# Patient Record
Sex: Male | Born: 1950 | Race: Black or African American | Hispanic: No | Marital: Married | Smoking: Never smoker
Health system: Southern US, Community
[De-identification: ages and names within clinical notes are randomized; demographics above are authoritative.]

## PROBLEM LIST (undated history)

## (undated) DIAGNOSIS — K56609 Unspecified intestinal obstruction, unspecified as to partial versus complete obstruction: Secondary | ICD-10-CM

## (undated) DIAGNOSIS — I10 Essential (primary) hypertension: Secondary | ICD-10-CM

## (undated) DIAGNOSIS — M1990 Unspecified osteoarthritis, unspecified site: Secondary | ICD-10-CM

## (undated) DIAGNOSIS — H35039 Hypertensive retinopathy, unspecified eye: Secondary | ICD-10-CM

## (undated) DIAGNOSIS — Z9289 Personal history of other medical treatment: Secondary | ICD-10-CM

## (undated) HISTORY — PX: CATARACT EXTRACTION: SUR2

## (undated) HISTORY — DX: Hypertensive retinopathy, unspecified eye: H35.039

## (undated) HISTORY — PX: ABDOMINAL EXPLORATION SURGERY: SHX538

---

## 1988-08-29 DIAGNOSIS — Z9289 Personal history of other medical treatment: Secondary | ICD-10-CM

## 1988-08-29 HISTORY — DX: Personal history of other medical treatment: Z92.89

## 1988-08-29 HISTORY — PX: COLOSTOMY: SHX63

## 1988-08-29 HISTORY — PX: COLECTOMY: SHX59

## 1988-08-29 HISTORY — PX: COLOSTOMY TAKEDOWN: SHX5783

## 2004-08-25 ENCOUNTER — Emergency Department (HOSPITAL_COMMUNITY): Admission: EM | Admit: 2004-08-25 | Discharge: 2004-08-26 | Payer: Self-pay | Admitting: Emergency Medicine

## 2004-08-29 HISTORY — PX: QUADRICEPS TENDON REPAIR: SHX756

## 2005-06-18 ENCOUNTER — Emergency Department (HOSPITAL_COMMUNITY): Admission: EM | Admit: 2005-06-18 | Discharge: 2005-06-18 | Payer: Self-pay | Admitting: Emergency Medicine

## 2005-06-30 ENCOUNTER — Ambulatory Visit (HOSPITAL_COMMUNITY): Admission: RE | Admit: 2005-06-30 | Discharge: 2005-07-01 | Payer: Self-pay | Admitting: Orthopaedic Surgery

## 2008-06-18 ENCOUNTER — Emergency Department (HOSPITAL_COMMUNITY): Admission: EM | Admit: 2008-06-18 | Discharge: 2008-06-18 | Payer: Self-pay | Admitting: Emergency Medicine

## 2008-06-18 ENCOUNTER — Encounter (INDEPENDENT_AMBULATORY_CARE_PROVIDER_SITE_OTHER): Payer: Self-pay | Admitting: Family Medicine

## 2009-03-15 ENCOUNTER — Emergency Department (HOSPITAL_COMMUNITY): Admission: EM | Admit: 2009-03-15 | Discharge: 2009-03-15 | Payer: Self-pay | Admitting: Emergency Medicine

## 2009-06-01 ENCOUNTER — Emergency Department (HOSPITAL_COMMUNITY): Admission: EM | Admit: 2009-06-01 | Discharge: 2009-06-01 | Payer: Self-pay | Admitting: Emergency Medicine

## 2009-07-22 ENCOUNTER — Ambulatory Visit: Payer: Self-pay | Admitting: Family Medicine

## 2009-07-22 DIAGNOSIS — M545 Low back pain: Secondary | ICD-10-CM | POA: Insufficient documentation

## 2009-07-22 DIAGNOSIS — K648 Other hemorrhoids: Secondary | ICD-10-CM

## 2009-07-22 DIAGNOSIS — J984 Other disorders of lung: Secondary | ICD-10-CM

## 2009-07-22 DIAGNOSIS — I10 Essential (primary) hypertension: Secondary | ICD-10-CM

## 2009-07-22 DIAGNOSIS — K921 Melena: Secondary | ICD-10-CM | POA: Insufficient documentation

## 2009-07-29 ENCOUNTER — Ambulatory Visit: Payer: Self-pay | Admitting: Family Medicine

## 2009-08-05 ENCOUNTER — Ambulatory Visit (HOSPITAL_COMMUNITY): Admission: RE | Admit: 2009-08-05 | Discharge: 2009-08-05 | Payer: Self-pay | Admitting: Family Medicine

## 2009-08-05 ENCOUNTER — Ambulatory Visit: Payer: Self-pay | Admitting: Family Medicine

## 2009-08-05 DIAGNOSIS — K044 Acute apical periodontitis of pulpal origin: Secondary | ICD-10-CM

## 2009-08-05 DIAGNOSIS — E559 Vitamin D deficiency, unspecified: Secondary | ICD-10-CM

## 2009-08-05 DIAGNOSIS — F3289 Other specified depressive episodes: Secondary | ICD-10-CM | POA: Insufficient documentation

## 2009-08-05 DIAGNOSIS — R7301 Impaired fasting glucose: Secondary | ICD-10-CM

## 2009-08-05 DIAGNOSIS — F329 Major depressive disorder, single episode, unspecified: Secondary | ICD-10-CM

## 2009-08-06 ENCOUNTER — Encounter (INDEPENDENT_AMBULATORY_CARE_PROVIDER_SITE_OTHER): Payer: Self-pay | Admitting: Family Medicine

## 2009-08-26 ENCOUNTER — Telehealth (INDEPENDENT_AMBULATORY_CARE_PROVIDER_SITE_OTHER): Payer: Self-pay | Admitting: Nurse Practitioner

## 2009-09-24 ENCOUNTER — Ambulatory Visit: Payer: Self-pay | Admitting: Physician Assistant

## 2009-09-24 DIAGNOSIS — E782 Mixed hyperlipidemia: Secondary | ICD-10-CM | POA: Insufficient documentation

## 2009-09-24 DIAGNOSIS — R197 Diarrhea, unspecified: Secondary | ICD-10-CM | POA: Insufficient documentation

## 2009-09-24 DIAGNOSIS — E785 Hyperlipidemia, unspecified: Secondary | ICD-10-CM

## 2009-09-24 DIAGNOSIS — E1169 Type 2 diabetes mellitus with other specified complication: Secondary | ICD-10-CM

## 2009-09-24 LAB — CONVERTED CEMR LAB
Blood Glucose, Fingerstick: 171
Cholesterol, target level: 200 mg/dL
HDL goal, serum: 40 mg/dL
LDL Goal: 130 mg/dL

## 2009-10-05 ENCOUNTER — Telehealth: Payer: Self-pay | Admitting: Physician Assistant

## 2009-11-05 ENCOUNTER — Ambulatory Visit: Payer: Self-pay | Admitting: Physician Assistant

## 2009-11-05 DIAGNOSIS — N529 Male erectile dysfunction, unspecified: Secondary | ICD-10-CM

## 2009-11-05 LAB — CONVERTED CEMR LAB
BUN: 17 mg/dL (ref 6–23)
Basophils Absolute: 0 10*3/uL (ref 0.0–0.1)
Basophils Relative: 0 % (ref 0–1)
CO2: 27 meq/L (ref 19–32)
Calcium: 9.9 mg/dL (ref 8.4–10.5)
Chloride: 103 meq/L (ref 96–112)
Creatinine, Ser: 1.23 mg/dL (ref 0.40–1.50)
Eosinophils Absolute: 0.2 10*3/uL (ref 0.0–0.7)
Eosinophils Relative: 4 % (ref 0–5)
Glucose, Bld: 93 mg/dL (ref 70–99)
HCT: 42.7 % (ref 39.0–52.0)
Hemoglobin: 13.8 g/dL (ref 13.0–17.0)
Lymphocytes Relative: 29 % (ref 12–46)
Lymphs Abs: 1.7 10*3/uL (ref 0.7–4.0)
MCHC: 32.3 g/dL (ref 30.0–36.0)
MCV: 91.4 fL (ref 78.0–100.0)
Monocytes Absolute: 0.5 10*3/uL (ref 0.1–1.0)
Monocytes Relative: 8 % (ref 3–12)
Neutro Abs: 3.5 10*3/uL (ref 1.7–7.7)
Neutrophils Relative %: 59 % (ref 43–77)
PSA: 1.15 ng/mL (ref 0.10–4.00)
Platelets: 178 10*3/uL (ref 150–400)
Potassium: 4.1 meq/L (ref 3.5–5.3)
RBC: 4.67 M/uL (ref 4.22–5.81)
RDW: 13.5 % (ref 11.5–15.5)
Sodium: 140 meq/L (ref 135–145)
WBC: 5.9 10*3/uL (ref 4.0–10.5)

## 2009-11-17 ENCOUNTER — Telehealth: Payer: Self-pay | Admitting: Physician Assistant

## 2009-11-20 ENCOUNTER — Encounter: Payer: Self-pay | Admitting: Physician Assistant

## 2009-12-07 ENCOUNTER — Telehealth (INDEPENDENT_AMBULATORY_CARE_PROVIDER_SITE_OTHER): Payer: Self-pay | Admitting: *Deleted

## 2009-12-10 ENCOUNTER — Telehealth: Payer: Self-pay | Admitting: Physician Assistant

## 2010-01-05 ENCOUNTER — Ambulatory Visit: Payer: Self-pay | Admitting: Physician Assistant

## 2010-01-06 LAB — CONVERTED CEMR LAB
ALT: 28 units/L (ref 0–53)
AST: 30 units/L (ref 0–37)
Albumin: 4.1 g/dL (ref 3.5–5.2)
Alkaline Phosphatase: 60 units/L (ref 39–117)
BUN: 20 mg/dL (ref 6–23)
CO2: 18 meq/L — ABNORMAL LOW (ref 19–32)
Calcium: 9 mg/dL (ref 8.4–10.5)
Chloride: 109 meq/L (ref 96–112)
Cholesterol: 161 mg/dL (ref 0–200)
Creatinine, Ser: 1.36 mg/dL (ref 0.40–1.50)
Glucose, Bld: 103 mg/dL — ABNORMAL HIGH (ref 70–99)
HDL: 36 mg/dL — ABNORMAL LOW (ref >39)
LDL Cholesterol: 76 mg/dL (ref 0–99)
Potassium: 4.2 meq/L (ref 3.5–5.3)
Sodium: 141 meq/L (ref 135–145)
Total Bilirubin: 0.5 mg/dL (ref 0.3–1.2)
Total CHOL/HDL Ratio: 4.5
Total Protein: 7.4 g/dL (ref 6.0–8.3)
Triglycerides: 244 mg/dL — ABNORMAL HIGH (ref <150)
VLDL: 49 mg/dL — ABNORMAL HIGH (ref 0–40)
Vit D, 25-Hydroxy: 33 ng/mL (ref 30–89)

## 2010-02-04 ENCOUNTER — Ambulatory Visit (HOSPITAL_COMMUNITY): Admission: RE | Admit: 2010-02-04 | Discharge: 2010-02-04 | Payer: Self-pay | Admitting: Gastroenterology

## 2010-02-08 ENCOUNTER — Telehealth: Payer: Self-pay | Admitting: Physician Assistant

## 2010-02-08 ENCOUNTER — Ambulatory Visit: Payer: Self-pay | Admitting: Physician Assistant

## 2010-02-08 DIAGNOSIS — D126 Benign neoplasm of colon, unspecified: Secondary | ICD-10-CM

## 2010-02-08 LAB — CONVERTED CEMR LAB: Hgb A1c MFr Bld: 6.5 %

## 2010-07-11 ENCOUNTER — Emergency Department (HOSPITAL_COMMUNITY): Admission: EM | Admit: 2010-07-11 | Discharge: 2010-07-11 | Payer: Self-pay | Admitting: Emergency Medicine

## 2010-09-26 LAB — CONVERTED CEMR LAB
ALT: 17 units/L (ref 0–53)
AST: 21 units/L (ref 0–37)
Albumin: 4.1 g/dL (ref 3.5–5.2)
Alkaline Phosphatase: 65 units/L (ref 39–117)
BUN: 16 mg/dL (ref 6–23)
BUN: 16 mg/dL (ref 6–23)
Basophils Absolute: 0 10*3/uL (ref 0.0–0.1)
Basophils Relative: 0 % (ref 0–1)
Blood Glucose, AC Bkfst: 116 mg/dL
CO2: 22 meq/L (ref 19–32)
CO2: 23 meq/L (ref 19–32)
CRP: 0.6 mg/dL — ABNORMAL HIGH (ref <0.6)
Calcium: 9.4 mg/dL (ref 8.4–10.5)
Calcium: 9.5 mg/dL (ref 8.4–10.5)
Chloride: 104 meq/L (ref 96–112)
Chloride: 106 meq/L (ref 96–112)
Cholesterol: 186 mg/dL (ref 0–200)
Creatinine, Ser: 1.25 mg/dL (ref 0.40–1.50)
Creatinine, Ser: 1.37 mg/dL (ref 0.40–1.50)
Eosinophils Absolute: 0.2 10*3/uL (ref 0.0–0.7)
Eosinophils Relative: 3 % (ref 0–5)
Glucose, Bld: 110 mg/dL — ABNORMAL HIGH (ref 70–99)
Glucose, Bld: 151 mg/dL — ABNORMAL HIGH (ref 70–99)
HCT: 39.4 % (ref 39.0–52.0)
HCV Ab: NEGATIVE
HDL: 36 mg/dL — ABNORMAL LOW (ref >39)
Hemoglobin: 13.3 g/dL (ref 13.0–17.0)
LDL Cholesterol: 83 mg/dL (ref 0–99)
Lymphocytes Relative: 20 % (ref 12–46)
Lymphs Abs: 1.4 10*3/uL (ref 0.7–4.0)
MCHC: 33.8 g/dL (ref 30.0–36.0)
MCV: 89.7 fL (ref 78.0–100.0)
Monocytes Absolute: 0.5 10*3/uL (ref 0.1–1.0)
Monocytes Relative: 7 % (ref 3–12)
Neutro Abs: 5 10*3/uL (ref 1.7–7.7)
Neutrophils Relative %: 71 % (ref 43–77)
Platelets: 182 10*3/uL (ref 150–400)
Potassium: 3.7 meq/L (ref 3.5–5.3)
Potassium: 4.1 meq/L (ref 3.5–5.3)
RBC: 4.39 M/uL (ref 4.22–5.81)
RDW: 12.9 % (ref 11.5–15.5)
Sed Rate: 19 mm/hr — ABNORMAL HIGH (ref 0–16)
Sodium: 141 meq/L (ref 135–145)
Sodium: 142 meq/L (ref 135–145)
TSH: 4.401 microintl units/mL (ref 0.350–4.500)
Total Bilirubin: 0.3 mg/dL (ref 0.3–1.2)
Total CHOL/HDL Ratio: 5.2
Total Protein: 7.1 g/dL (ref 6.0–8.3)
Triglycerides: 334 mg/dL — ABNORMAL HIGH (ref <150)
VLDL: 67 mg/dL — ABNORMAL HIGH (ref 0–40)
Vit D, 25-Hydroxy: 16 ng/mL — ABNORMAL LOW (ref 30–89)
WBC: 7 10*3/uL (ref 4.0–10.5)

## 2010-09-28 NOTE — Progress Notes (Signed)
Summary: GI update  Phone Note From Other Clinic   Caller: Referral Coordinator Summary of Call: Pt aware that he can call [facility] and sched accroding to his availability. Initial call taken by: Candi Leash,  December 10, 2009 11:53 AM  Follow-up for Phone Call        [place],  Please ask him when he plans to go. Follow-up by: Tereso Newcomer PA-C,  December 10, 2009 1:23 PM  Additional Follow-up for Phone Call Additional follow up Details #1::        Left message on answering machine for pt to call back.Marland KitchenMarland KitchenMarland KitchenArmenia Shannon  December 11, 2009 4:53 PM     Additional Follow-up for Phone Call Additional follow up Details #2::    pt says that he called but they would not give him a date... [place] Shannon  December 14, 2009 2:39 PM  spoke with [facility] and they need pt to call for the appt date and time... [place] Shannon  December 14, 2009 2:45 PM

## 2010-09-28 NOTE — Assessment & Plan Note (Signed)
Summary: 3 MONTH FU/////KT   Vital Signs:  Patient profile:   60 year old male Weight:      216 pounds Temp:     97.4 degrees F Pulse rate:   82 / minute Pulse rhythm:   regular Resp:     18 per minute BP sitting:   144 / 89  (left arm) Cuff size:   large  Vitals Entered By: Vesta Mixer CMA (February 08, 2010 8:53 AM)  Serial Vital Signs/Assessments:  Time      Position  BP       Pulse  Resp  Temp     By 9:20 AM             124/88                         Tereso Newcomer PA-C 9:21 AM             134/90                         Tereso Newcomer PA-C  Comments: 9:20 AM left arm By: Tereso Newcomer PA-C  9:21 AM right arm  By: Tereso Newcomer PA-C   CC: 3 month f/u.   Pt states he is only taking two meds and his fish oil, but not sure the name of the 2 he is taking., Hypertension Management Is Patient Diabetic? No Pain Assessment Patient in pain? no       Does patient need assistance? Ambulation Normal   Primary Care Provider:  Tereso Newcomer, PA-C  CC:  3 month f/u.   Pt states he is only taking two meds and his fish oil, but not sure the name of the 2 he is taking., and Hypertension Management.  History of Present Illness: Here for f/u.  HTN:  Here for f/u.  Admits compliance with meds.  Watching salt.  Does not eat out.  Occ. caffeine.  Drinks beer . . . 1-2 quarts on weekends.  No cigs.    BRBPR:  Had colo.  No report rec'd yet.  States he has hemorrhoids.  Original consult in Dec. was for surgery but needed colo first.    Vit. D:  needs daily supplement.  Erectile Dysfunction:  Still having problems.  Discussed poss. starting viagra last visit.  PSA was ok.   Hypertension History:      He denies headache, chest pain, dyspnea with exertion, and syncope.  He notes the following problems with antihypertensive medication side effects: erectile dysfunction.        Positive major cardiovascular risk factors include male age 50 years old or older, hyperlipidemia, and  hypertension.  Negative major cardiovascular risk factors include negative family history for ischemic heart disease and non-tobacco-user status.        Further assessment for target organ damage reveals no history of ASHD, stroke/TIA, or peripheral vascular disease.     Allergies (verified): No Known Drug Allergies  Past History:  Past Medical History: Hypertension Low back pain Hematochezia   a.  colo 02/04/2010: polyps; ? 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   Head:  normocephalic and atraumatic.   Neck:  supple.   Lungs:  normal breath sounds.   Heart:  normal rate and regular  rhythm.   Abdomen:  soft and non-tender.   Extremities:  no edema Neurologic:  alert & oriented X3 and cranial nerves II-XII intact.   Psych:  normally interactive.     Impression & Recommendations:  Problem # 1:  HEMORRHOIDS, INTERNAL (ICD-455.0)  now that he has had colo, will set up referral to surgeon records indicate he had seen Dr. Zachery Dakins previously Dr. Luan Moore note reviewed. . . .needs surgical referral due to ? polyp in rectum  Orders: Surgical Referral (Surgery)  Problem # 2:  HYPERTENSION (ICD-401.9) close to goal watch salt  His updated medication list for this problem includes:    Norvasc 10 Mg Tabs (Amlodipine besylate) .Marland Kitchen... 1/2 by mouth daily x 7 days then 1 by mouth daily    Lisinopril-hydrochlorothiazide 20-25 Mg Tabs (Lisinopril-hydrochlorothiazide) .Marland Kitchen... 1 by mouth daily  Problem # 3:  VITAMIN D DEFICIENCY (ICD-268.9) daily supplement rx given  Problem # 4:  ORGANIC IMPOTENCE (ICD-607.84)  Rx for viagra given  His updated medication list for this problem includes:    Viagra 50 Mg Tabs (Sildenafil citrate) .Marland Kitchen... Take 1 by mouth 30-60 minutes prior to intercourse as needed.  no more than one tablet  in 24 hour period.  Problem # 5:  IMPAIRED FASTING GLUCOSE (ICD-790.21)  A1C 6.5 will send to dietician need to keep close eye on this and poss start metformin if stays 6.5 or goes higher  Orders: Capillary Blood Glucose/CBG (82948) Hemoglobin A1C (83036)  Complete Medication List: 1)  Norvasc 10 Mg Tabs (Amlodipine besylate) .... 1/2 by mouth daily x 7 days then 1 by mouth daily 2)  Lisinopril-hydrochlorothiazide 20-25 Mg Tabs (Lisinopril-hydrochlorothiazide) .Marland Kitchen.. 1 by mouth daily 3)  Fish Oil 1000 Mg Caps (Omega-3 fatty acids) .... Take 1 tablet by mouth two times a day 4)  Vitamin D 400 Unit Caps (Cholecalciferol) .... Take 2 by mouth once daily 5)  Viagra 50 Mg Tabs (Sildenafil citrate) .... Take 1 by mouth 30-60 minutes prior to intercourse as needed.  no more than one tablet in 24 hour period.  Hypertension Assessment/Plan:      The patient's hypertensive risk group is category B: At least one risk factor (excluding diabetes) with no target organ damage.  His calculated 10 year risk of coronary heart disease is 9 %.  Today's blood pressure is 144/89.  His blood pressure goal is < 140/90.  Patient Instructions: 1)  Schedule FLP and LFTs in October 2011. 2)  Schedule appt with Suse Piper for glucose intolerance (A1C 6.5) 3)  Please schedule a follow-up appointment in 3 months with Lamonda Noxon for blood pressure and sugar follow up. Prescriptions: VIAGRA 50 MG TABS (SILDENAFIL CITRATE) Take 1 by mouth 30-60 minutes prior to intercourse as needed.  No more than one tablet in 24 hour period.  #15 x 3   Entered and Authorized by:   Tereso Newcomer PA-C   Signed by:   Tereso Newcomer PA-C on 02/08/2010   Method used:   Print then Give to Patient   RxID:   [id number] VITAMIN D 400 UNIT CAPS (CHOLECALCIFEROL) Take 2 by mouth once daily  #60 x 11   Entered and Authorized by:   Tereso Newcomer PA-C   Signed by:   Tereso Newcomer PA-C on 02/08/2010   Method used:   Print then Give to Patient    RxID:   [id number]    Colonoscopy  Procedure date:  02/04/2010  Findings:      IMPRESSION:   1. Mild  diverticulosis.   2. A small polyp in the sigmoid, which was removed.   3. A small polyp in the rectum, which was removed.   4. At the anorectal junction, there was either a palpable ulcerated       hemorrhoid or polypoid mass.  I am unsure at this time what this       is.  I will plan surgical referral for further evaluation.  I am       not sure if this was a hemorrhoid or a polyp at the anorectal       junction.   Location:  [facility]  Dr. Shanna Cisco   Comments:      Surgical referral recommended Path c/w hyperplastic polyps.  No malignancy identified.      Laboratory Results   Blood Tests     HGBA1C: 6.5%   (Normal Range: Non-Diabetic - 3-6%   Control Diabetic - 6-8%)

## 2010-09-28 NOTE — Letter (Signed)
Summary: DENTAL REFERRAL  DENTAL REFERRAL   Imported By: Arta Bruce 09/21/2009 15:19:47  _____________________________________________________________________  External Attachment:    Type:   Image     Comment:   External Document

## 2010-09-28 NOTE — Progress Notes (Signed)
Summary: Surgical Referral  Phone Note Outgoing Call   Summary of Call: Needs referral to surgery for hemorrhoids.  Saw Dr. Zachery Dakins several mos. ago.  Needed colo.  Colo now done.  Please refer back to Dr. Zachery Dakins. Initial call taken by: Tereso Newcomer PA-C,  February 08, 2010 9:10 AM

## 2010-09-28 NOTE — Letter (Signed)
Summary: Handout Printed  Printed Handout:  - Diet - Sodium-Controlled 

## 2010-09-28 NOTE — Progress Notes (Signed)
Summary: MISLPACED HIS VITAMIN SCRIPT  Phone Note Call from Patient Call back at Home Phone [phone]   Reason for Call: Refill Medication Summary of Call: Logan Knight PT. [name] CALLED AND SAYS THAT HE MISPLACED HIS VITAMIN SCRIPT WHEN HE WAS HER ON 01/27 AND HE WOULD LIKE FOR [place] TO CALL IT INTO HEALTH DEPT. Initial call taken by: Leodis Rains,  October 05, 2009 2:15 PM  Follow-up for Phone Call        called refills into pharmacy...Marland KitchenMarland KitchenMarland Kitchen pt is aware Follow-up by: [place] Shannon,  October 06, 2009 10:25 AM

## 2010-09-28 NOTE — Assessment & Plan Note (Signed)
Summary: 1 MONTH F/U///BC   Vital Signs:  Patient profile:   60 year old male Height:      72 inches Weight:      213 pounds BMI:     28.99 Temp:     97.8 degrees F oral Pulse rate:   93 / minute Pulse rhythm:   regular Resp:     18 per minute BP sitting:   162 / 89  (left arm) Cuff size:   large  Vitals Entered By: [place] Shannon (September 24, 2009 10:33 AM)  Serial Vital Signs/Assessments:  Time      Position  BP       Pulse  Resp  Temp     By 11:16 AM            134/86                         Tereso Newcomer PA-C  CC: pt says since the change of meds.... pt  says he uses the bathroom more now...., Hypertension Management, Lipid Management Is Patient Diabetic? No Pain Assessment Patient in pain? no      CBG Result 171  Does patient need assistance? Functional Status Self care Ambulation Normal   Primary Care Provider:  Tereso Newcomer, PA-C  CC:  pt says since the change of meds.... pt  says he uses the bathroom more now...., Hypertension Management, and Lipid Management.  History of Present Illness: Here for f/u.  Previously followed by Dr. Carolyne Fiscal.  Depression:  Only took citalopram for 2 mos.  No more refills and now off for about 2 weeks.  No suicidal ideations.  Has not seen counseling but is willing to go.  Hematochezia:  Youth worker.  Needs colo. first.  Has been referred to GI at [facility] and goes for initial visit in 2 weeks.  No further bleeding in last several weeks.  HTN:  Out of meds for a few days and restarted just today.  Lung Nodule:  Findings c/w benign hamartoma.  Diarrhea:  Complaining of frequent stools for last few weeks.  May have 5-6 stools.  Loose.  No melena or hematochezia.  No mucus.  No fevers or chills or vomitting.  Thought may be due to meds, but no change when he was out of meds.  No h/o IBD.  No constipation.  No abdominal cramping.  No tenesmus.    Vit. D level:  Not notified of this.  WIll start med.  Hypertriglyceridemia:  Not taking  fish oil.  Hypertension History:      He denies headache, chest pain, dyspnea with exertion, and syncope.  Further comments include: out of meds for 2 days and just restarted this am.        Positive major cardiovascular risk factors include male age 65 years old or older, hyperlipidemia, and hypertension.  Negative major cardiovascular risk factors include negative family history for ischemic heart disease and non-tobacco-user status.        Further assessment for target organ damage reveals no history of ASHD, stroke/TIA, or peripheral vascular disease.    Lipid Management History:      Positive NCEP/ATP III risk factors include male age 15 years old or older, HDL cholesterol less than 40, and hypertension.  Negative NCEP/ATP III risk factors include no family history for ischemic heart disease, non-tobacco-user status, no ASHD (atherosclerotic heart disease), no prior stroke/TIA, no peripheral vascular disease, and no history of aortic aneurysm.  Current Medications (verified): 1)  Norvasc 10 Mg Tabs (Amlodipine Besylate) .... 1/2 By Mouth Daily X 7 Days Then 1 By Mouth Daily 2)  Lisinopril-Hydrochlorothiazide 20-25 Mg Tabs (Lisinopril-Hydrochlorothiazide) .Marland Kitchen.. 1 By Mouth Daily 3)  Citalopram Hydrobromide 20 Mg Tabs (Citalopram Hydrobromide) .Marland Kitchen.. 1 By Mouth Daily 4)  Clindamycin Hcl 300 Mg Caps (Clindamycin Hcl) .Marland Kitchen.. 1 By Mouth Tid  Allergies (verified): No Known Drug Allergies  Physical Exam  General:  alert, well-developed, and well-nourished.   Head:  normocephalic and atraumatic.   Neck:  supple.   Lungs:  normal breath sounds, no crackles, and no wheezes.   Heart:  normal rate and regular rhythm.   Abdomen:  soft, non-tender, normal bowel sounds, and no hepatomegaly.   scar noted Neurologic:  alert & oriented X3 and cranial nerves II-XII intact.   Psych:  normally interactive and good eye contact.     Impression & Recommendations:  Problem # 1:  DEPRESSION, INITIAL  EPISODE (ICD-311) restart med  His updated medication list for this problem includes:    Citalopram Hydrobromide 20 Mg Tabs (Citalopram hydrobromide) .Marland Kitchen... 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 IBS restart SSRI sees GI in 2 weeks  Problem # 6:  HYPERTENSION (ICD-401.9) repeat better prob up from missing meds for a few days hold off on starting asa until colo  His updated medication list for this problem includes:    Norvasc 10 Mg Tabs (Amlodipine besylate) .Marland Kitchen... 1/2 by mouth daily x 7 days then 1 by mouth daily    Lisinopril-hydrochlorothiazide 20-25 Mg Tabs (Lisinopril-hydrochlorothiazide) .Marland Kitchen... 1 by mouth daily  Problem # 7:  HEMATOCHEZIA (ICD-578.1) schedule to see GI at [facility] in 2 weeks if colo ok, start ASA 81 once daily   Complete Medication List: 1)  Norvasc 10 Mg Tabs (Amlodipine besylate) .... 1/2 by mouth daily x 7 days then 1 by mouth daily 2)  Lisinopril-hydrochlorothiazide 20-25 Mg Tabs (Lisinopril-hydrochlorothiazide) .Marland Kitchen.. 1 by mouth daily 3)  Citalopram Hydrobromide 20 Mg Tabs (Citalopram hydrobromide) .Marland Kitchen.. 1 by mouth daily 4)  Clindamycin Hcl 300 Mg Caps (Clindamycin hcl) .Marland Kitchen.. 1 by mouth tid 5)  Ergocalciferol 50000 Unit Caps (Ergocalciferol) .Marland Kitchen.. 1 by mouth once a week for 12 weeks 6)  Fish Oil 1000 Mg Caps (Omega-3 fatty acids) .... Take 1 tablet by mouth two times a day  Hypertension Assessment/Plan:      The patient's hypertensive risk group is category B: At least one risk factor (excluding diabetes) with no target organ damage.  His calculated 10 year risk of coronary heart disease  is 11 %.  Today's blood pressure is 162/89.  His blood pressure goal is < 140/90.  Lipid Assessment/Plan:      Based on NCEP/ATP III, the patient's risk factor category is "2 or more risk factors and a calculated 10 year CAD risk of < 20%".  The patient's lipid goals are as follows: Total cholesterol goal is 200; LDL cholesterol goal is 130; HDL cholesterol goal is 40; Triglyceride goal is 150.     Patient Instructions: 1)  Schedule appt with Ethelene Browns. 2)  Restart your citalopram.  3)  Schedule appt with Susie Piper 4)  Please schedule a follow-up appointment in 1 month with Kasidee Voisin for blood pressure. 5)  Call if your diarrhea does not clear up with restarting your citalopram. 6)    Prescriptions: FISH OIL 1000 MG CAPS (OMEGA-3 FATTY ACIDS) Take 1 tablet by mouth two times a day  #60 x 11   Entered and Authorized by:   Tereso Newcomer PA-C   Signed by:   Tereso Newcomer PA-C on 09/24/2009   Method used:   Print then Give to Patient   RxID:   [id number] ERGOCALCIFEROL 50000 UNIT CAPS (ERGOCALCIFEROL) 1 by mouth once a week for 12 weeks  #12 x 0   Entered and Authorized by:   Tereso Newcomer PA-C   Signed by:   Tereso Newcomer PA-C on 09/24/2009   Method used:   Print then Give to Patient   RxID:   [id number]

## 2010-09-28 NOTE — Assessment & Plan Note (Signed)
Summary: 1 MONTH FU///KT   Vital Signs:  Patient profile:   60 year old male Height:      72 inches Weight:      217 pounds BMI:     29.54 Temp:     98.0 degrees F oral Pulse rate:   80 / minute Pulse rhythm:   regular Resp:     18 per minute BP sitting:   132 / 81  (left arm)  Vitals Entered By: [place] Shannon (November 05, 2009 8:33 AM) CC: one month, Lipid Management Is Patient Diabetic? No Pain Assessment Patient in pain? no       Does patient need assistance? Functional Status Self care Ambulation Normal   Primary Care Provider:  Tereso Newcomer, PA-C  CC:  one month and Lipid Management.  History of Present Illness: Here for f/u.  Depression:  Found a job at [employer] in [place].  Does maint.  No longer feels depressed since he started his job.  Never saw Marchelle Folks.  No suicidal ideations.    HTN:  Taking meds.  BP looks better today.  Took last tablet of Lisinopril yest.  Not sure when he can get refilled.  Will try to get tomorrow.    Hematochezia:  Was supposed to see GI at [facility] after last appt.  He was told he needed to bring $375.  He could not afford and canceled the appt.  No more diarrhea.  No constipation.  No tenesmus.  No hematochezia.  No melena.  No mucus.  ? anal leakage.  ED:  Complaining of inability to get erection.  Sex drive is present.  No decreased energy.  No am erections.  Lipid Management History:      Positive NCEP/ATP III risk factors include male age 23 years old or older, HDL cholesterol less than 40, and hypertension.  Negative NCEP/ATP III risk factors include no family history for ischemic heart disease, non-tobacco-user status, no ASHD (atherosclerotic heart disease), no prior stroke/TIA, no peripheral vascular disease, and no history of aortic aneurysm.    Habits & Providers  Exercise-Depression-Behavior     Have you felt down or hopeless? no     Have you felt little pleasure in things? no  Problems Prior to Update: 1)  Organic Impotence   (ICD-607.84) 2)  Diarrhea  (ICD-787.91) 3)  Dyslipidemia  (ICD-272.4) 4)  Vitamin D Deficiency  (ICD-268.9) 5)  Acute Apical Periodontitis of Pulpal Origin  (ICD-522.4) 6)  Depression, Initial Episode  (ICD-311) 7)  Impaired Fasting Glucose  (ICD-790.21) 8)  Hemorrhoids, Internal  (ICD-455.0) 9)  Preventive Health Care  (ICD-V70.0) 10)  Lung Nodule  (ICD-518.89) 11)  Hematochezia  (ICD-578.1) 12)  Family History of Cad Male 1st Degree Relative <50  (ICD-V17.3) 13)  Family History of Alcoholism/addiction  (ICD-V61.41) 14)  Family History Diabetes 1st Degree Relative  (ICD-V18.0) 15)  Low Back Pain  (ICD-724.2) 16)  Hypertension  (ICD-401.9)  Current Medications (verified): 1)  Norvasc 10 Mg Tabs (Amlodipine Besylate) .... 1/2 By Mouth Daily X 7 Days Then 1 By Mouth Daily 2)  Lisinopril-Hydrochlorothiazide 20-25 Mg Tabs (Lisinopril-Hydrochlorothiazide) .Marland Kitchen.. 1 By Mouth Daily 3)  Citalopram Hydrobromide 20 Mg Tabs (Citalopram Hydrobromide) .Marland Kitchen.. 1 By Mouth Daily 4)  Clindamycin Hcl 300 Mg Caps (Clindamycin Hcl) .Marland Kitchen.. 1 By Mouth Tid 5)  Ergocalciferol 50000 Unit Caps (Ergocalciferol) .Marland Kitchen.. 1 By Mouth Once A Week For 12 Weeks 6)  Fish Oil 1000 Mg Caps (Omega-3 Fatty Acids) .... 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 Family History Diabetes 1st degree relative Family History Hypertension Family History Liver disease Family History of Alcoholism/Addiction Family History of CAD Male 1st degree relative <50  Social History: Occupation: Unemployed Married - Separated 2 kids Never Smoked Alcohol use-yes Drug use-no Regular exercise-no  Physical Exam  General:  alert, well-developed, and  well-nourished.   Head:  normocephalic and atraumatic.   Neck:  supple.   Lungs:  normal breath sounds, no crackles, and no wheezes.   Heart:  normal rate and regular rhythm.   Abdomen:  soft and non-tender.   Rectal:  external hemorrhoid(s) and internal hemorrhoid(s).   Genitalia:  uncircumcised, no hydrocele, no varicocele, no scrotal masses, no testicular masses or atrophy, no cutaneous lesions, and no urethral discharge.   Prostate:  no gland enlargement, no nodules, no asymmetry, and no induration.   Neurologic:  alert & oriented X3 and cranial nerves II-XII intact.   Psych:  normally interactive and not depressed appearing.     Impression & Recommendations:  Problem # 1:  HEMATOCHEZIA (ICD-578.1)  never went to GI appt for colo was told he needed to bring $375 and he could not afford will try to reschedule appt no more blood in stools noted  Orders: T-CBC No Diff ([id number])  Problem # 2:  DEPRESSION, INITIAL EPISODE (ICD-311) never restarted meds denies being depressed with finding new job never saw counselor  His updated medication list for this problem includes:    Citalopram Hydrobromide 20 Mg Tabs (Citalopram hydrobromide) .Marland Kitchen... 1 by mouth daily  Problem # 3:  HYPERTENSION (ICD-401.9)  controlled discussed importance of continuing to take bp meds and get refills promptly so that he does not run out  His updated medication list for this problem includes:    Norvasc 10 Mg Tabs (Amlodipine besylate) .Marland Kitchen... 1/2 by mouth daily x 7 days then 1 by mouth daily    Lisinopril-hydrochlorothiazide 20-25 Mg Tabs (Lisinopril-hydrochlorothiazide) .Marland Kitchen... 1 by mouth daily  Orders: T-Basic Metabolic Panel [phone])  Problem # 4:  ORGANIC IMPOTENCE (ICD-607.84)  suspect related to HTN and meds for HTN hold off on prescribing meds until we can be certain that he can continue getting his bp meds as directed prostate exam ok discussed indications and implications of getting  PSA and he wants to get test no signs of hypogonadism  Orders: T-PSA ([id number])  Problem # 5:  VITAMIN D DEFICIENCY (ICD-268.9) has Rx for Vit D  but is out needs to get refilled to complete regimen  Problem # 6:  DYSLIPIDEMIA (ICD-272.4) repeat FLP and LFTs in May  Problem # 7:  HEMORRHOIDS, INTERNAL (ICD-455.0) very large on exam nontender has seen surgeon but needed colo first see No. 1  Complete Medication List: 1)  Norvasc 10 Mg Tabs (Amlodipine besylate) .... 1/2 by mouth daily x 7 days then 1 by mouth daily 2)  Lisinopril-hydrochlorothiazide 20-25 Mg Tabs (Lisinopril-hydrochlorothiazide) .Marland Kitchen.. 1 by mouth daily 3)  Citalopram Hydrobromide 20 Mg Tabs (Citalopram hydrobromide) .Marland Kitchen.. 1 by mouth daily 4)  Clindamycin Hcl 300 Mg Caps (Clindamycin hcl) .Marland Kitchen.. 1 by mouth tid 5)  Ergocalciferol 50000 Unit Caps (Ergocalciferol) .Marland Kitchen.. 1 by mouth once a week for 12 weeks 6)  Fish Oil 1000  Mg Caps (Omega-3 fatty acids) .... Take 1 tablet by mouth two times a day  Lipid Assessment/Plan:      Based on NCEP/ATP III, the patient's risk factor category is "2 or more risk factors and a calculated 10 year CAD risk of < 20%".  The patient's lipid goals are as follows: Total cholesterol goal is 200; LDL cholesterol goal is 130; HDL cholesterol goal is 40; Triglyceride goal is 150.     Patient Instructions: 1)  Return fasting in May for labs: CMET and Lipids, Vitamin D level (272.4; 268.9). 2)  Do not eat or drink anything after midnight the night before except water. 3)  Please schedule a follow-up appointment in 3 months with Oliviah Agostini for blood pressure.

## 2010-09-28 NOTE — Letter (Signed)
Summary: REFERRAL /GASTROENTEROLOGY  REFERRAL /GASTROENTEROLOGY   Imported By: Arta Bruce 01/06/2010 12:32:47  _____________________________________________________________________  External Attachment:    Type:   Image     Comment:   External Document

## 2010-09-28 NOTE — Letter (Signed)
Summary: TEST ORDER FORM//CT WITHOUT CONTRAST//LUNG NODULE//APPT DATE & T  TEST ORDER FORM//CT WITHOUT CONTRAST//LUNG NODULE//APPT DATE & TIME   Imported By: Arta Bruce 09/22/2009 15:20:53  _____________________________________________________________________  External Attachment:    Type:   Image     Comment:   External Document

## 2010-09-28 NOTE — Progress Notes (Signed)
Summary: GI referral update  Phone Note From Other Clinic   Caller: Referral Coordinator Summary of Call: A message was left for Pt on 11-05-09.Pt has not returned the call as of 11-17-09, but when he does he will have to contact the billing dept to take care of a past due balance. Initial call taken by: Candi Leash,  November 17, 2009 11:05 AM  Follow-up for Phone Call        I think this is for Estelene Carmack--will redirect to him. Follow-up by: Julieanne Manson MD,  November 19, 2009 8:27 AM  Additional Follow-up for Phone Call Additional follow up Details #1::        [place] Please notify patient so he can get appt scheduled. Additional Follow-up by: Tereso Newcomer PA-C,  November 20, 2009 2:29 PM    Additional Follow-up for Phone Call Additional follow up Details #2::    Left message on answering machine for pt to call back...Marland KitchenMarland KitchenArmenia Shannon  November 23, 2009 2:36 PM   Additional Follow-up for Phone Call Additional follow up Details #3:: Details for Additional Follow-up Action Taken: PT RETURNED YOUR CALLED TODAY//PLEASE GIVE HIM A CALL BACK ON HIS CELL PHONE # [phone]  Did advise pt that he had a bill at [facility] and we can not move further with referral until he contacted office. Pt was not aware of ever going to [facility] for anything but I did advise it could have been in hospital or something. Had patient to hold to get number to contact office and pt hung before I could get back with him. Tried to contact pt back but he didint answer the phone.Marland KitchenMarland KitchenMarland KitchenMikey College CMA  November 24, 2009 11:40 AM   Left message on answering machine for pt to call back.Marland KitchenMarland KitchenArmenia Shannon  November 26, 2009 11:10 AM   pt is aware and will give them a call.... [place] Shannon  November 26, 2009 4:48 PM  Additional Follow-up by: Arta Bruce,  November 24, 2009 9:24 AM

## 2010-09-28 NOTE — Progress Notes (Signed)
  Phone Note Call from Patient   Summary of Call: pt came by office to let me know that he paid the bill at [facility] and he would like to be referred now Initial call taken by: [place] Shannon,  December 07, 2009 2:36 PM

## 2010-09-28 NOTE — Letter (Signed)
Summary: PT INFORMATION SHEET  PT INFORMATION SHEET   Imported By: Arta Bruce 09/18/2009 11:27:36  _____________________________________________________________________  External Attachment:    Type:   Image     Comment:   External Document

## 2010-09-28 NOTE — Letter (Signed)
Summary: *HSN Results Follow up  [facility]  [address] [place], [place] [address]   Phone: [phone]  Fax: [phone]      11/20/2009   MATHEAU ORONA [address] [place], [place]  [address]   Dear  Mr. TAMARA KENYON,                            ____S.Drinkard,FNP   ____D. Gore,FNP       ____B. McPherson,MD   ____V. Rankins,MD    ____E. Mulberry,MD    ____N. Daphine Deutscher, FNP  ____D. Reche Dixon, MD    ____K. Philipp Deputy, MD    __x__S. Alben Spittle, PA-C     This letter is to inform you that your recent test(s):  _______Pap Smear    ____x___Lab Test     _______X-ray    ___x____ is within acceptable limits  _______ requires a medication change  _______ requires a follow-up lab visit  _______ requires a follow-up visit with your provider   Comments: We have been trying to reach you to discuss your referral.  Please call us.       _________________________________________________________ If you have any questions, please contact our office                     Sincerely,  Tereso Newcomer PA-C [facility]

## 2010-11-09 LAB — POCT I-STAT, CHEM 8
BUN: 9 mg/dL (ref 6–23)
Calcium, Ion: 1 mmol/L — ABNORMAL LOW (ref 1.12–1.32)
Chloride: 110 mEq/L (ref 96–112)
Creatinine, Ser: 1.5 mg/dL (ref 0.4–1.5)
Glucose, Bld: 98 mg/dL (ref 70–99)
HCT: 45 % (ref 39.0–52.0)
Hemoglobin: 15.3 g/dL (ref 13.0–17.0)
Potassium: 3.6 mEq/L (ref 3.5–5.1)
Sodium: 145 mEq/L (ref 135–145)
TCO2: 24 mmol/L (ref 0–100)

## 2011-01-14 NOTE — Op Note (Signed)
Logan Knight, RAWL NO.:  [id number]   MEDICAL RECORD NO.:  [id number]          PATIENT TYPE:  OIB   LOCATION:  5002                         FACILITY:  [facility]   PHYSICIAN:  Claude Manges. Whitfield, M.D.DATE OF BIRTH:  05-16-1951   DATE OF PROCEDURE:  06/30/2005  DATE OF DISCHARGE:                                 OPERATIVE REPORT   PREOPERATIVE DIAGNOSIS:  Ruptured right distal quad mechanism.   POSTOPERATIVE DIAGNOSIS:  Ruptured right distal quad mechanism.   OPERATION/PROCEDURE:  Primary repair of ruptured right quad mechanism.   SURGEON:  Claude Manges. Cleophas Dunker, M.D.   ASSISTANT:  Richardean Canal, P.A.-C.   ANESTHESIA:  General orotracheal.   COMPLICATIONS:  None.   HISTORY:  A 60 year old gentleman is nearly two weeks status post injury to  his right lower extremity when he missed a step and then he had acute onset  of pain just above his right knee with inability to extend his knee or bear  weight. He was seen in the [facility] Emergency Room with a diagnosis of a  ruptured right quad mechanism.  He was placed on pain medicine, crutches and  knee immobilizer. He was followed up in the office earlier this week.  Clinically he does have a complete ruptured of the quad with a significant  gap, about an inch or two proximal to the patella.  He was unable to  actively extend the knee with a positive knee hemarthrosis. The patient was  __________  to have exploration of the right distal thigh with primary  repair of the quad mechanism rupture.   DESCRIPTION OF PROCEDURE:  With the patient comfortable on the operating  table and under general orotracheal anesthesia, right lower extremity was  placed in a thigh tourniquet.  The leg was then prepped with Betadine and  scrubbed with DuraPrep and the tourniquet to the mid foot.  Sterile draping  was performed.  With the extremity still elevated, was Esmarch exsanguinated  with a proximal tourniquet at 350 mmHg.  A  midline longitudinal incision was  made along the distal thigh anteriorly to about the mid patella.  The  incision was carried down to the subcutaneous tissue through the first layer  of capsule. There was an obvious rupture of the quad mechanism extending  from the medial gutter to the lateral gutter and involving only soft tissue.  There was still quad mechanism attached to the patella. There were no  avulsed patella fragments within either the proximal or distal segment of  the ruptured quad.  There were some frayed edges which were sharply  debrided. Hemarthrosis was evacuated from the knee.  The knee was carefully  inspected without evidence of loose material.  The edges were sharply  debrided both proximally and distally and then I did a primary repair using  a combination of sutures, a #1 Ethibond and #2 FiberWire.  The repair was  supplemented with 0 Vicryl stitches.  The repair extended from the medial  gutter and capsule to the lateral gutter and capsule extending over near  approximately four to five inches.  At the end of the repair I was able to  flex the knee at least 60 degrees without any tension on the repair.  The  wound was irrigated both intra-articularly and extra-articularly pre and  post repair.  The 0 Vicryl was used in a running stitch for the superficial  capsule.  Skin was closed with skin clips.  Sterile bulky dressing was  applied followed by an  Ace bandage  and the patient's knee immobilizer.  Marcaine 0.25% with  epinephrine was injected into the wound edges prior to dressing application.  The patient tolerated the procedure without complications.   PLAN:  Mepergan Fortis for pain, crutches, knee immobilizer, office in one  week.      Claude Manges. Cleophas Dunker, M.D.  Electronically Signed     PWW/MEDQ  D:  06/30/2005  T:  07/01/2005  Job:  [id number]
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
## 2011-04-16 ENCOUNTER — Emergency Department (HOSPITAL_COMMUNITY): Payer: Self-pay

## 2011-04-16 ENCOUNTER — Inpatient Hospital Stay (HOSPITAL_COMMUNITY)
Admission: EM | Admit: 2011-04-16 | Discharge: 2011-04-20 | DRG: 390 | Disposition: A | Discharge status: Home / Self Care | Payer: Self-pay | Attending: Internal Medicine | Admitting: Internal Medicine

## 2011-04-16 DIAGNOSIS — R112 Nausea with vomiting, unspecified: Secondary | ICD-10-CM

## 2011-04-16 DIAGNOSIS — I10 Essential (primary) hypertension: Secondary | ICD-10-CM | POA: Diagnosis present

## 2011-04-16 DIAGNOSIS — K56609 Unspecified intestinal obstruction, unspecified as to partial versus complete obstruction: Secondary | ICD-10-CM

## 2011-04-16 DIAGNOSIS — R109 Unspecified abdominal pain: Secondary | ICD-10-CM

## 2011-04-16 DIAGNOSIS — K8020 Calculus of gallbladder without cholecystitis without obstruction: Secondary | ICD-10-CM | POA: Diagnosis present

## 2011-04-16 DIAGNOSIS — E860 Dehydration: Secondary | ICD-10-CM | POA: Diagnosis present

## 2011-04-16 DIAGNOSIS — K5730 Diverticulosis of large intestine without perforation or abscess without bleeding: Secondary | ICD-10-CM | POA: Diagnosis present

## 2011-04-16 LAB — DIFFERENTIAL
Basophils Absolute: 0 10*3/uL (ref 0.0–0.1)
Basophils Relative: 0 % (ref 0–1)
Eosinophils Relative: 0 % (ref 0–5)
Lymphocytes Relative: 8 % — ABNORMAL LOW (ref 12–46)
Lymphs Abs: 1 10*3/uL (ref 0.7–4.0)
Monocytes Absolute: 0.8 10*3/uL (ref 0.1–1.0)
Monocytes Relative: 6 % (ref 3–12)
Neutro Abs: 10.6 10*3/uL — ABNORMAL HIGH (ref 1.7–7.7)
Neutrophils Relative %: 86 % — ABNORMAL HIGH (ref 43–77)

## 2011-04-16 LAB — URINALYSIS, ROUTINE W REFLEX MICROSCOPIC
Bilirubin Urine: NEGATIVE
Glucose, UA: NEGATIVE mg/dL
Hgb urine dipstick: NEGATIVE
Ketones, ur: NEGATIVE mg/dL
Nitrite: NEGATIVE
Protein, ur: 100 mg/dL — AB
Urobilinogen, UA: 1 mg/dL (ref 0.0–1.0)
pH: 6 (ref 5.0–8.0)

## 2011-04-16 LAB — CBC
HCT: 48 % (ref 39.0–52.0)
Hemoglobin: 16.9 g/dL (ref 13.0–17.0)
MCH: 30.5 pg (ref 26.0–34.0)
MCHC: 35.2 g/dL (ref 30.0–36.0)
MCV: 86.5 fL (ref 78.0–100.0)
Platelets: 226 10*3/uL (ref 150–400)
RBC: 5.55 MIL/uL (ref 4.22–5.81)
WBC: 12.4 10*3/uL — ABNORMAL HIGH (ref 4.0–10.5)

## 2011-04-16 LAB — COMPREHENSIVE METABOLIC PANEL
ALT: 26 U/L (ref 0–53)
AST: 39 U/L — ABNORMAL HIGH (ref 0–37)
Albumin: 4.3 g/dL (ref 3.5–5.2)
Alkaline Phosphatase: 74 U/L (ref 39–117)
BUN: 19 mg/dL (ref 6–23)
CO2: 28 mEq/L (ref 19–32)
Chloride: 98 mEq/L (ref 96–112)
GFR calc Af Amer: >60 mL/min (ref >60)
GFR calc non Af Amer: >60 mL/min (ref >60)
Glucose, Bld: 134 mg/dL — ABNORMAL HIGH (ref 70–99)
Potassium: 4.4 mEq/L (ref 3.5–5.1)
Sodium: 140 mEq/L (ref 135–145)
Total Protein: 9 g/dL — ABNORMAL HIGH (ref 6.0–8.3)

## 2011-04-16 LAB — URINE MICROSCOPIC-ADD ON

## 2011-04-16 LAB — LIPASE, BLOOD: Lipase: 28 U/L (ref 11–59)

## 2011-04-16 MED ORDER — IOHEXOL 300 MG/ML  SOLN
100.0000 mL | Freq: Once | INTRAMUSCULAR | Status: AC | PRN
  Administered 2011-04-16: 100 mL via INTRAVENOUS

## 2011-04-17 ENCOUNTER — Inpatient Hospital Stay (HOSPITAL_COMMUNITY): Payer: Self-pay

## 2011-04-17 LAB — CBC
Hemoglobin: 15.1 g/dL (ref 13.0–17.0)
MCV: 88.4 fL (ref 78.0–100.0)
Platelets: 201 10*3/uL (ref 150–400)
RBC: 4.93 MIL/uL (ref 4.22–5.81)
WBC: 9.1 10*3/uL (ref 4.0–10.5)

## 2011-04-17 LAB — BASIC METABOLIC PANEL
CO2: 30 mEq/L (ref 19–32)
Chloride: 103 mEq/L (ref 96–112)
Creatinine, Ser: 1.26 mg/dL (ref 0.50–1.35)

## 2011-04-18 ENCOUNTER — Inpatient Hospital Stay (HOSPITAL_COMMUNITY): Payer: Self-pay

## 2011-04-18 LAB — OCCULT BLOOD X 1 CARD TO LAB, STOOL: Fecal Occult Bld: NEGATIVE
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
## 2011-05-30 LAB — CBC
HCT: 41.7
Hemoglobin: 14.2
WBC: 7

## 2011-05-30 LAB — DIFFERENTIAL
Eosinophils Relative: 2
Lymphocytes Relative: 16
Lymphs Abs: 1.1
Monocytes Absolute: 0.4

## 2011-05-30 LAB — POCT CARDIAC MARKERS
CKMB, poc: 3.2
Myoglobin, poc: 172
Troponin i, poc: <0.05

## 2011-05-30 LAB — POCT I-STAT, CHEM 8
Glucose, Bld: 164 — ABNORMAL HIGH
HCT: 42
Hemoglobin: 14.3

## 2012-06-21 ENCOUNTER — Emergency Department (HOSPITAL_COMMUNITY): Payer: Self-pay

## 2012-06-21 ENCOUNTER — Observation Stay (HOSPITAL_COMMUNITY)
Admission: EM | Admit: 2012-06-21 | Discharge: 2012-06-22 | Disposition: A | Discharge status: Home / Self Care | Payer: Self-pay | Attending: Emergency Medicine | Admitting: Emergency Medicine

## 2012-06-21 ENCOUNTER — Encounter (HOSPITAL_COMMUNITY): Payer: Self-pay | Admitting: *Deleted

## 2012-06-21 DIAGNOSIS — R079 Chest pain, unspecified: Secondary | ICD-10-CM

## 2012-06-21 DIAGNOSIS — I10 Essential (primary) hypertension: Secondary | ICD-10-CM | POA: Insufficient documentation

## 2012-06-21 DIAGNOSIS — R0789 Other chest pain: Principal | ICD-10-CM | POA: Insufficient documentation

## 2012-06-21 HISTORY — DX: Essential (primary) hypertension: I10

## 2012-06-21 LAB — COMPREHENSIVE METABOLIC PANEL
AST: 27 U/L (ref 0–37)
Albumin: 3.5 g/dL (ref 3.5–5.2)
Alkaline Phosphatase: 68 U/L (ref 39–117)
BUN: 14 mg/dL (ref 6–23)
Chloride: 104 mEq/L (ref 96–112)
Potassium: 3.7 mEq/L (ref 3.5–5.1)
Total Protein: 7.6 g/dL (ref 6.0–8.3)

## 2012-06-21 LAB — POCT I-STAT TROPONIN I: Troponin i, poc: 0.02 ng/mL (ref 0.00–0.08)

## 2012-06-21 LAB — CBC
HCT: 41.6 % (ref 39.0–52.0)
MCHC: 34.6 g/dL (ref 30.0–36.0)
Platelets: 186 10*3/uL (ref 150–400)
RDW: 12.3 % (ref 11.5–15.5)
WBC: 6.8 10*3/uL (ref 4.0–10.5)

## 2012-06-21 MED ORDER — ASPIRIN EC 325 MG PO TBEC
325.0000 mg | DELAYED_RELEASE_TABLET | Freq: Once | ORAL | Status: AC
  Administered 2012-06-21: 325 mg via ORAL
  Filled 2012-06-21: qty 1

## 2012-06-21 MED ORDER — ACETAMINOPHEN 325 MG PO TABS
650.0000 mg | ORAL_TABLET | Freq: Once | ORAL | Status: AC
  Administered 2012-06-21: 650 mg via ORAL
  Filled 2012-06-21: qty 2

## 2012-06-21 MED ORDER — LABETALOL HCL 5 MG/ML IV SOLN
20.0000 mg | Freq: Once | INTRAVENOUS | Status: AC
  Administered 2012-06-21: 20 mg via INTRAVENOUS
  Filled 2012-06-21: qty 4

## 2012-06-21 NOTE — ED Provider Notes (Signed)
Patient moved to CDU under chest pain protocol. Patient resting comfortably at present without return of chest pain. Lungs CTA bilaterally. S1/S2, RRR, no murmur. Abdomen soft, bowel sounds present. Strong distal pulses palpated all extremities. Sinus rhythm on monitor without ectopy. Troponin negative x 1. 12 lead reviewed, no indication of ischemia. Patient scheduled for Coronary CT in AM. Pending delta troponin x2.  Diagnostic and treatment plan discussed with patient.   2nd troponin remains negative.  Last delta troponin scheduled for 1am.  Pt c/o headache.  Tylenol ordered.  Pt is otherwise pain free and remains without complaint.    Dahlia Client Drayke Grabel, PA-C 06/21/12 [phone]

## 2012-06-21 NOTE — ED Provider Notes (Signed)
History     CSN: [id number]  Arrival date & time 06/21/12  1836   First MD Initiated Contact with Patient 06/21/12 2028      Chief Complaint  Patient presents with  . Chest Pain    (Consider location/radiation/quality/duration/timing/severity/associated sxs/prior treatment) Patient is a 61 y.o. male presenting with chest pain. The history is provided by the patient. No language interpreter was used.  Chest Pain The chest pain began 3 - 5 days ago. Duration of episode(s) is 2 hours. Chest pain occurs intermittently. The chest pain is resolved. At its most intense, the pain is at 8/10. The pain is currently at 0/10. The severity of the pain is severe. The quality of the pain is described as sharp. The pain radiates to the left shoulder and left arm. Pertinent negatives for primary symptoms include no shortness of breath, no cough, no abdominal pain, no nausea, no vomiting and no dizziness.  Pertinent negatives for associated symptoms include no diaphoresis, no lower extremity edema and no near-syncope. He tried aspirin for the symptoms. Risk factors include male gender.  His past medical history is significant for hypertension.  Pertinent negatives for past medical history include no CAD, no cancer, no CHF, no diabetes and no MI.  Pertinent negatives for family medical history include: no early MI in family.  Procedure history is negative for cardiac catheterization and echocardiogram.     Past Medical History  Diagnosis Date  . Hypertension     History reviewed. No pertinent past surgical history.  History reviewed. No pertinent family history.  History  Substance Use Topics  . Smoking status: Never Smoker   . Smokeless tobacco: Not on file  . Alcohol Use: Yes      Review of Systems  Constitutional: Negative for diaphoresis, activity change and appetite change.  HENT: Negative for sore throat and neck pain.   Eyes: Negative for discharge and visual disturbance.    Respiratory: Negative for cough, choking and shortness of breath.   Cardiovascular: Positive for chest pain. Negative for leg swelling and near-syncope.  Gastrointestinal: Negative for nausea, vomiting, abdominal pain, diarrhea and constipation.  Genitourinary: Negative for dysuria and difficulty urinating.  Musculoskeletal: Negative for back pain and arthralgias.  Skin: Negative for color change and pallor.  Neurological: Positive for light-headedness (intermittent mild). Negative for dizziness and speech difficulty.  Psychiatric/Behavioral: Negative for behavioral problems and agitation.  All other systems reviewed and are negative.    Allergies  Review of patient's allergies indicates no known allergies.  Home Medications   Current Outpatient Rx  Name Route Sig Dispense Refill  . LISINOPRIL-HYDROCHLOROTHIAZIDE 20-25 MG PO TABS Oral Take 1 tablet by mouth daily.      BP 194/117  Pulse 79  Temp 98.6 F (37 C) (Oral)  Resp 18  SpO2 97%  Physical Exam  Constitutional: He is oriented to person, place, and time. He appears well-developed. No distress.  HENT:  Head: Normocephalic and atraumatic.  Mouth/Throat: No oropharyngeal exudate.  Eyes: EOM are normal. Pupils are equal, round, and reactive to light. Right eye exhibits no discharge. Left eye exhibits no discharge.  Neck: Normal range of motion. Neck supple. No JVD present.  Cardiovascular: Normal rate, regular rhythm and normal heart sounds.   Pulmonary/Chest: Effort normal and breath sounds normal. No stridor. No respiratory distress. He has no wheezes. He has no rales. He exhibits no tenderness.  Abdominal: Soft. Bowel sounds are normal. There is no tenderness. There is no guarding.  Genitourinary: Penis  normal.  Musculoskeletal: Normal range of motion. He exhibits no edema and no tenderness.  Neurological: He is alert and oriented to person, place, and time. No cranial nerve deficit. He exhibits normal muscle tone.   Skin: Skin is warm and dry. He is not diaphoretic. No erythema. No pallor.  Psychiatric: He has a normal mood and affect. His behavior is normal. Judgment and thought content normal.    ED Course  Procedures (including critical care time)  Labs Reviewed  COMPREHENSIVE METABOLIC PANEL - Abnormal; Notable for the following:    Glucose, Bld 126 (*)     Total Bilirubin 0.2 (*)     GFR calc non Af Amer 63 (*)     GFR calc Af Amer 73 (*)     All other components within normal limits  CBC  POCT I-STAT TROPONIN I   Dg Chest 2 View  06/21/2012  *RADIOLOGY REPORT*  Clinical Data: Chest pain and shortness of breath.  CHEST - 2 VIEW  Comparison: CT of the chest 08/05/2009.  Findings: A 2 cm right lower lobe nodule appears larger than on the prior exams.  The heart size is normal.  The lungs are otherwise clear.  Mild degenerative changes in the thoracic spine are stable.  IMPRESSION:  1.  Enlarging right lower lobe pulmonary nodule.  Although the lesion appeared benign by CT, there appears to be interval growth. Non emergent follow-up CT scan without contrast could be use for further evaluation. 2.  No acute cardiopulmonary disease.   Original Report Authenticated By: Jamesetta Orleans. MATTERN, M.D.      No diagnosis found.    MDM  8:45 PM p/w intermittent L CP rad down LUE for past 4 days. Also ran out of BP med 4 wks ago. Denies CP currently, just c/o R dull HA, gradual onset, intermittent. No neuro sxs. nml exam, hypertensive. Labs unremarkable incl neg Tn. CXR NACPD. Will plan to do low risk r/o ACS, has not had provocative stress testing in the past. TIMI 2 for multiple events + ASA use. Labetalol 20mg  IV given for HTN. ASA given for HA.  9:09 PM moved to CDU in stable condition.     Comprehensive metabolic panel (Final result)  Abnormal  Component (Lab Inquiry)      Result Time  NA  K  CL  CO2  GLUCOSE    06/21/12 1954  137  3.7 HEMOLYSIS AT THIS LEVEL MAY AFFECT RESULT LIPEMIC  SPECIMEN  104  23  126 (H)           Result Time  BUN  Creatinine, Ser  CALCIUM  PROTEIN  Albumin    06/21/12 1954  14  1.22  9.4  7.6  3.5           Result Time  AST  ALT  ALK PHOS  BILI TOTL  GFR calc non Af Amer    06/21/12 1954  27  18  68  0.2 (L)  63 (L)           Result Time  GFR calc Af Amer    06/21/12 1954  73 (L) The eGFR has been calculated using the CKD EPI equation. This calculation has not been validated in all clinical situations. eGFR's persistently <90 mL/min signify possible Chronic Kidney Disease.         POCT i-Stat troponin I (Final result)   Component (Lab Inquiry)      Result Time  Troponin i, poc  Comment  3    06/21/12 1914  0.02   Due to the release kinetics of cTnI, a negative result within the first hours of the onset of symptoms does not rule out myocardial infarction with certainty. If myocardial infarction is still suspected, repeat the test at appropriate intervals.         CBC (Final result)   Component (Lab Inquiry)      Result Time  WBC  RBC  HGB  HCT  MCV    06/21/12 1909  6.8  4.75  14.4  41.6  87.6           Result Time  MCH  MCHC  RDW  PLT    06/21/12 1909  30.3  34.6  12.3  186          Imaging Results         DG Chest 2 View (Final result)   Result time:06/21/12 1933    Final result by Rad Results In Interface (06/21/12 19:33:48)    Narrative:   *RADIOLOGY REPORT*  Clinical Data: Chest pain and shortness of breath.  CHEST - 2 VIEW  Comparison: CT of the chest 08/05/2009.  Findings: A 2 cm right lower lobe nodule appears larger than on the prior exams. The heart size is normal. The lungs are otherwise clear. Mild degenerative changes in the thoracic spine are stable.  IMPRESSION:  1. Enlarging right lower lobe pulmonary nodule. Although the lesion appeared benign by CT, there appears to be interval growth. Non emergent follow-up CT scan without contrast could be use for further evaluation. 2. No acute  cardiopulmonary disease.   Original Report Authenticated By: Jamesetta Orleans. MATTERN, M.D.      Warrick Parisian, MD 06/21/12 2110

## 2012-06-21 NOTE — ED Notes (Signed)
Patient with left sided pain across the top of his shoulder that started Monday morning.  Patient also c/o headache for the last three days.  Patient denies SOB, denies nausea, but feels lightheaded at time and does not feel like himself.  Patient states this his left arm was tingling yesterday but not today

## 2012-06-21 NOTE — ED Provider Notes (Signed)
I have supervised the resident on the management of this patient and agree with the note above. I personally interviewed and examined the patient and my addendum is below.   Logan Knight is a 61 y.o. male hx HTN here with chest pain that is intermittent, L sided chest pain. He is uncompliant with his antihypertensive and his BP is around 200/100 here. EKG NSR, Trop neg x 1. Never had cardiac imaging before. Now is pain free. Will transfer to CDU for chest pain protocol and will get cardiac CT in AM.    Richardean Canal, MD 06/21/12 2352

## 2012-06-22 ENCOUNTER — Observation Stay (HOSPITAL_COMMUNITY): Payer: Self-pay

## 2012-06-22 LAB — POCT I-STAT TROPONIN I: Troponin i, poc: 0.03 ng/mL (ref 0.00–0.08)

## 2012-06-22 MED ORDER — METOPROLOL TARTRATE 1 MG/ML IV SOLN
5.0000 mg | Freq: Once | INTRAVENOUS | Status: AC
  Administered 2012-06-22: 5 mg via INTRAVENOUS

## 2012-06-22 MED ORDER — METOPROLOL TARTRATE 25 MG PO TABS
100.0000 mg | ORAL_TABLET | Freq: Once | ORAL | Status: AC
  Administered 2012-06-22: 100 mg via ORAL
  Filled 2012-06-22: qty 4

## 2012-06-22 MED ORDER — ACETAMINOPHEN 325 MG PO TABS
650.0000 mg | ORAL_TABLET | Freq: Once | ORAL | Status: AC
  Administered 2012-06-22: 650 mg via ORAL
  Filled 2012-06-22: qty 2

## 2012-06-22 MED ORDER — METOPROLOL TARTRATE 1 MG/ML IV SOLN
INTRAVENOUS | Status: AC
  Filled 2012-06-22: qty 15

## 2012-06-22 MED ORDER — IOHEXOL 350 MG/ML SOLN
80.0000 mL | Freq: Once | INTRAVENOUS | Status: AC | PRN
  Administered 2012-06-22: 80 mL via INTRAVENOUS

## 2012-06-22 MED ORDER — LISINOPRIL-HYDROCHLOROTHIAZIDE 20-25 MG PO TABS: 1.0000 | ORAL_TABLET | Freq: Every day | ORAL | Status: DC

## 2012-06-22 MED ORDER — NITROGLYCERIN 0.4 MG SL SUBL
SUBLINGUAL_TABLET | SUBLINGUAL | Status: AC
  Administered 2012-06-22: 0.4 mg via SUBLINGUAL
  Filled 2012-06-22: qty 25

## 2012-06-22 MED ORDER — NITROGLYCERIN 0.4 MG SL SUBL
0.4000 mg | SUBLINGUAL_TABLET | Freq: Once | SUBLINGUAL | Status: AC
  Administered 2012-06-22: 0.4 mg via SUBLINGUAL

## 2012-06-22 MED ORDER — LABETALOL HCL 5 MG/ML IV SOLN
5.0000 mg | Freq: Once | INTRAVENOUS | Status: AC
  Administered 2012-06-22: 5 mg via INTRAVENOUS
  Filled 2012-06-22: qty 4

## 2012-06-22 NOTE — ED Notes (Addendum)
Npo now 

## 2012-06-22 NOTE — ED Notes (Signed)
27.6 bmi

## 2012-06-22 NOTE — ED Provider Notes (Signed)
Medical screening examination/treatment/procedure(s) were performed by non-physician practitioner and as supervising physician I was immediately available for consultation/collaboration.   Richardean Canal, MD 06/22/12 1739

## 2012-06-22 NOTE — ED Provider Notes (Signed)
8:15:  Patient without complaints. In CDU on CPP. No chest pain throughout the night and none currently. Waiting on completion of tests.  12:00 - Dr. Llana Aliment phoned with CTA results as follows: No CAD, calc score 0. Does have benign appearing nodule in lung that was seen previously that is somewhat larger now. Discussed this with patient who was unaware and recommended primary care follow up for definitive diagnosis.  He has been out of Lisinopril for 4 weeks. He is a former Information systems manager patient. Felicia made aware to assist with PCP relocation and Rx for Lisinopril written.   He can be discharged home.  Rodena Medin, PA-C 06/22/12 1204

## 2012-06-22 NOTE — ED Provider Notes (Signed)
Medical screening examination/treatment/procedure(s) were performed by non-physician practitioner and as supervising physician I was immediately available for consultation/collaboration.   Richardean Canal, MD 06/22/12 [phone]

## 2014-09-13 ENCOUNTER — Encounter (HOSPITAL_COMMUNITY): Payer: Self-pay | Admitting: *Deleted

## 2014-09-13 ENCOUNTER — Emergency Department (HOSPITAL_COMMUNITY)
Admission: EM | Admit: 2014-09-13 | Discharge: 2014-09-13 | Disposition: A | Discharge status: Home / Self Care | Payer: Self-pay | Attending: Emergency Medicine | Admitting: Emergency Medicine

## 2014-09-13 ENCOUNTER — Emergency Department (HOSPITAL_COMMUNITY): Payer: Self-pay

## 2014-09-13 DIAGNOSIS — M549 Dorsalgia, unspecified: Secondary | ICD-10-CM | POA: Insufficient documentation

## 2014-09-13 DIAGNOSIS — Z79899 Other long term (current) drug therapy: Secondary | ICD-10-CM | POA: Insufficient documentation

## 2014-09-13 DIAGNOSIS — M542 Cervicalgia: Secondary | ICD-10-CM | POA: Insufficient documentation

## 2014-09-13 DIAGNOSIS — I10 Essential (primary) hypertension: Secondary | ICD-10-CM | POA: Insufficient documentation

## 2014-09-13 LAB — CBC WITH DIFFERENTIAL/PLATELET
Basophils Absolute: 0 10*3/uL (ref 0.0–0.1)
Basophils Relative: 0 % (ref 0–1)
EOS ABS: 0.3 10*3/uL (ref 0.0–0.7)
Eosinophils Relative: 4 % (ref 0–5)
HEMATOCRIT: 42.7 % (ref 39.0–52.0)
HEMOGLOBIN: 14.8 g/dL (ref 13.0–17.0)
Lymphocytes Relative: 21 % (ref 12–46)
Lymphs Abs: 1.4 10*3/uL (ref 0.7–4.0)
MCH: 30.1 pg (ref 26.0–34.0)
MCHC: 34.7 g/dL (ref 30.0–36.0)
MCV: 86.8 fL (ref 78.0–100.0)
MONO ABS: 0.5 10*3/uL (ref 0.1–1.0)
Monocytes Relative: 8 % (ref 3–12)
NEUTROS ABS: 4.6 10*3/uL (ref 1.7–7.7)
Neutrophils Relative %: 67 % (ref 43–77)
Platelets: 177 10*3/uL (ref 150–400)
RBC: 4.92 MIL/uL (ref 4.22–5.81)
RDW: 12.5 % (ref 11.5–15.5)
WBC: 6.9 10*3/uL (ref 4.0–10.5)

## 2014-09-13 LAB — BASIC METABOLIC PANEL
ANION GAP: 7 (ref 5–15)
BUN: 13 mg/dL (ref 6–23)
CHLORIDE: 103 meq/L (ref 96–112)
CO2: 27 mmol/L (ref 19–32)
CREATININE: 1.23 mg/dL (ref 0.50–1.35)
Calcium: 9.5 mg/dL (ref 8.4–10.5)
GFR calc non Af Amer: 61 mL/min — ABNORMAL LOW (ref >90)
GFR, EST AFRICAN AMERICAN: 70 mL/min — AB (ref >90)
Glucose, Bld: 149 mg/dL — ABNORMAL HIGH (ref 70–99)
Potassium: 3.7 mmol/L (ref 3.5–5.1)
SODIUM: 137 mmol/L (ref 135–145)

## 2014-09-13 LAB — CK: Total CK: 351 U/L — ABNORMAL HIGH (ref 7–232)

## 2014-09-13 MED ORDER — HYDROCODONE-ACETAMINOPHEN 5-325 MG PO TABS: 1.0000 | ORAL_TABLET | Freq: Two times a day (BID) | ORAL | Status: DC | PRN

## 2014-09-13 MED ORDER — SODIUM CHLORIDE 0.9 % IV BOLUS (SEPSIS)
1000.0000 mL | Freq: Once | INTRAVENOUS | Status: AC
  Administered 2014-09-13: 1000 mL via INTRAVENOUS

## 2014-09-13 MED ORDER — MORPHINE SULFATE 4 MG/ML IJ SOLN
6.0000 mg | Freq: Once | INTRAMUSCULAR | Status: AC
Start: 2014-09-13 — End: 2014-09-13
  Administered 2014-09-13: 6 mg via INTRAVENOUS
  Filled 2014-09-13: qty 2

## 2014-09-13 MED ORDER — IBUPROFEN 800 MG PO TABS: 800.0000 mg | ORAL_TABLET | Freq: Three times a day (TID) | ORAL | Status: DC | PRN
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
## 2014-09-13 NOTE — ED Notes (Signed)
Pt c/o a burning pain radiating up right side of back into neck.

## 2014-09-13 NOTE — Discharge Instructions (Signed)
Torticollis, Acute Mr. Breighner, you were seen today for neck and back pain, this may be related to a muscle strain. Your blood work, chest x-ray, EKG were all within normal limits. Take pain medication as prescribed and follow-up with a primary care physician within 3 days for continued management. If symptoms worsen come back to the emergency department immediately. Thank you. You have suddenly (acutely) developed a twisted neck (torticollis). This is usually a self-limited condition. CAUSES  Acute torticollis may be caused by malposition, trauma or infection. Most commonly, acute torticollis is caused by sleeping in an awkward position. Torticollis may also be caused by the flexion, extension or twisting of the neck muscles beyond their normal position. Sometimes, the exact cause may not be known. SYMPTOMS  Usually, there is pain and limited movement of the neck. Your neck may twist to one side. DIAGNOSIS  The diagnosis is often made by physical examination. X-rays, CT scans or MRIs may be done if there is a history of trauma or concern of infection. TREATMENT  For a common, stiff neck that develops during sleep, treatment is focused on relaxing the contracted neck muscle. Medications (including shots) may be used to treat the problem. Most cases resolve in several days. Torticollis usually responds to conservative physical therapy. If left untreated, the shortened and spastic neck muscle can cause deformities in the face and neck. Rarely, surgery is required. HOME CARE INSTRUCTIONS   Use over-the-counter and prescription medications as directed by your caregiver.  Do stretching exercises and massage the neck as directed by your caregiver.  Follow up with physical therapy if needed and as directed by your caregiver. SEEK IMMEDIATE MEDICAL CARE IF:   You develop difficulty breathing or noisy breathing (stridor).  You drool, develop trouble swallowing or have pain with swallowing.  You  develop numbness or weakness in the hands or feet.  You have changes in speech or vision.  You have problems with urination or bowel movements.  You have difficulty walking.  You have a fever.  You have increased pain. MAKE SURE YOU:   Understand these instructions.  Will watch your condition.  Will get help right away if you are not doing well or get worse. Document Released: 08/12/2000 Document Revised: 11/07/2011 Document Reviewed: 09/23/2009 [facility] Patient Information 2015 [place], [place]. This information is not intended to replace advice given to you by your health care provider. Make sure you discuss any questions you have with your health care provider. Back Pain, Adult Back pain is very common. The pain often gets better over time. The cause of back pain is usually not dangerous. Most people can learn to manage their back pain on their own.  HOME CARE   Stay active. Start with short walks on flat ground if you can. Try to walk farther each day.  Do not sit, drive, or stand in one place for more than 30 minutes. Do not stay in bed.  Do not avoid exercise or work. Activity can help your back heal faster.  Be careful when you bend or lift an object. Bend at your knees, keep the object close to you, and do not twist.  Sleep on a firm mattress. Lie on your side, and bend your knees. If you lie on your back, put a pillow under your knees.  Only take medicines as told by your doctor.  Put ice on the injured area.  Put ice in a plastic bag.  Place a towel between your skin and the bag.  Leave the ice on for 15-20 minutes, 03-04 times a day for the first 2 to 3 days. After that, you can switch between ice and heat packs.  Ask your doctor about back exercises or massage.  Avoid feeling anxious or stressed. Find good ways to deal with stress, such as exercise. GET HELP RIGHT AWAY IF:   Your pain does not go away with rest or medicine.  Your pain does not go away in  1 week.  You have new problems.  You do not feel well.  The pain spreads into your legs.  You cannot control when you poop (bowel movement) or pee (urinate).  Your arms or legs feel weak or lose feeling (numbness).  You feel sick to your stomach (nauseous) or throw up (vomit).  You have belly (abdominal) pain.  You feel like you may pass out (faint). MAKE SURE YOU:   Understand these instructions.  Will watch your condition.  Will get help right away if you are not doing well or get worse. Document Released: 02/01/2008 Document Revised: 11/07/2011 Document Reviewed: 12/17/2013 [facility] Patient Information 2015 [place], [place]. This information is not intended to replace advice given to you by your health care provider. Make sure you discuss any questions you have with your health care provider.

## 2015-01-03 ENCOUNTER — Emergency Department (HOSPITAL_COMMUNITY): Payer: Self-pay

## 2015-01-03 ENCOUNTER — Encounter (HOSPITAL_COMMUNITY): Payer: Self-pay | Admitting: Emergency Medicine

## 2015-01-03 ENCOUNTER — Inpatient Hospital Stay (HOSPITAL_COMMUNITY)
Admission: EM | Admit: 2015-01-03 | Discharge: 2015-01-07 | DRG: 871 | Disposition: A | Discharge status: Home / Self Care | Payer: Self-pay | Attending: Internal Medicine | Admitting: Internal Medicine

## 2015-01-03 DIAGNOSIS — Z0389 Encounter for observation for other suspected diseases and conditions ruled out: Secondary | ICD-10-CM

## 2015-01-03 DIAGNOSIS — R6510 Systemic inflammatory response syndrome (SIRS) of non-infectious origin without acute organ dysfunction: Secondary | ICD-10-CM | POA: Diagnosis present

## 2015-01-03 DIAGNOSIS — J208 Acute bronchitis due to other specified organisms: Secondary | ICD-10-CM | POA: Insufficient documentation

## 2015-01-03 DIAGNOSIS — J189 Pneumonia, unspecified organism: Secondary | ICD-10-CM

## 2015-01-03 DIAGNOSIS — K044 Acute apical periodontitis of pulpal origin: Secondary | ICD-10-CM

## 2015-01-03 DIAGNOSIS — J129 Viral pneumonia, unspecified: Secondary | ICD-10-CM | POA: Diagnosis present

## 2015-01-03 DIAGNOSIS — I10 Essential (primary) hypertension: Secondary | ICD-10-CM | POA: Diagnosis present

## 2015-01-03 DIAGNOSIS — K047 Periapical abscess without sinus: Secondary | ICD-10-CM

## 2015-01-03 DIAGNOSIS — R509 Fever, unspecified: Secondary | ICD-10-CM | POA: Diagnosis present

## 2015-01-03 DIAGNOSIS — N289 Disorder of kidney and ureter, unspecified: Secondary | ICD-10-CM | POA: Diagnosis not present

## 2015-01-03 DIAGNOSIS — R778 Other specified abnormalities of plasma proteins: Secondary | ICD-10-CM | POA: Diagnosis present

## 2015-01-03 DIAGNOSIS — R0789 Other chest pain: Secondary | ICD-10-CM

## 2015-01-03 DIAGNOSIS — J209 Acute bronchitis, unspecified: Secondary | ICD-10-CM | POA: Diagnosis present

## 2015-01-03 DIAGNOSIS — K921 Melena: Secondary | ICD-10-CM

## 2015-01-03 DIAGNOSIS — D126 Benign neoplasm of colon, unspecified: Secondary | ICD-10-CM

## 2015-01-03 DIAGNOSIS — R7989 Other specified abnormal findings of blood chemistry: Secondary | ICD-10-CM | POA: Diagnosis present

## 2015-01-03 DIAGNOSIS — IMO0001 Reserved for inherently not codable concepts without codable children: Secondary | ICD-10-CM

## 2015-01-03 DIAGNOSIS — R7301 Impaired fasting glucose: Secondary | ICD-10-CM

## 2015-01-03 DIAGNOSIS — E876 Hypokalemia: Secondary | ICD-10-CM | POA: Diagnosis present

## 2015-01-03 DIAGNOSIS — E1165 Type 2 diabetes mellitus with hyperglycemia: Secondary | ICD-10-CM | POA: Diagnosis present

## 2015-01-03 DIAGNOSIS — R079 Chest pain, unspecified: Secondary | ICD-10-CM | POA: Diagnosis present

## 2015-01-03 DIAGNOSIS — K029 Dental caries, unspecified: Secondary | ICD-10-CM | POA: Diagnosis present

## 2015-01-03 DIAGNOSIS — R197 Diarrhea, unspecified: Secondary | ICD-10-CM

## 2015-01-03 DIAGNOSIS — N529 Male erectile dysfunction, unspecified: Secondary | ICD-10-CM

## 2015-01-03 DIAGNOSIS — A419 Sepsis, unspecified organism: Principal | ICD-10-CM | POA: Diagnosis present

## 2015-01-03 MED ORDER — ACETAMINOPHEN 325 MG PO TABS
650.0000 mg | ORAL_TABLET | Freq: Once | ORAL | Status: AC
Start: 2015-01-03 — End: 2015-01-03
  Administered 2015-01-03: 650 mg via ORAL
  Filled 2015-01-03: qty 2

## 2015-01-03 MED ORDER — ASPIRIN 81 MG PO CHEW
324.0000 mg | CHEWABLE_TABLET | Freq: Once | ORAL | Status: AC
  Administered 2015-01-03: 324 mg via ORAL
  Filled 2015-01-03: qty 4
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
## 2015-01-03 NOTE — ED Notes (Signed)
Pt. reports left chest pain with SOB , nausea , emesis and diaphoresis .

## 2015-01-04 ENCOUNTER — Inpatient Hospital Stay (HOSPITAL_COMMUNITY): Payer: Self-pay

## 2015-01-04 ENCOUNTER — Emergency Department (HOSPITAL_COMMUNITY): Payer: Self-pay

## 2015-01-04 ENCOUNTER — Encounter (HOSPITAL_COMMUNITY): Payer: Self-pay | Admitting: Radiology

## 2015-01-04 DIAGNOSIS — I10 Essential (primary) hypertension: Secondary | ICD-10-CM

## 2015-01-04 DIAGNOSIS — E876 Hypokalemia: Secondary | ICD-10-CM | POA: Diagnosis present

## 2015-01-04 DIAGNOSIS — R7989 Other specified abnormal findings of blood chemistry: Secondary | ICD-10-CM

## 2015-01-04 DIAGNOSIS — R6510 Systemic inflammatory response syndrome (SIRS) of non-infectious origin without acute organ dysfunction: Secondary | ICD-10-CM | POA: Diagnosis present

## 2015-01-04 DIAGNOSIS — R079 Chest pain, unspecified: Secondary | ICD-10-CM

## 2015-01-04 DIAGNOSIS — R509 Fever, unspecified: Secondary | ICD-10-CM

## 2015-01-04 DIAGNOSIS — A419 Sepsis, unspecified organism: Principal | ICD-10-CM

## 2015-01-04 DIAGNOSIS — J189 Pneumonia, unspecified organism: Secondary | ICD-10-CM | POA: Insufficient documentation

## 2015-01-04 DIAGNOSIS — R778 Other specified abnormalities of plasma proteins: Secondary | ICD-10-CM | POA: Diagnosis present

## 2015-01-04 LAB — BASIC METABOLIC PANEL
ANION GAP: 15 (ref 5–15)
BUN: 12 mg/dL (ref 6–20)
CO2: 21 mmol/L — AB (ref 22–32)
Calcium: 9.5 mg/dL (ref 8.9–10.3)
Chloride: 97 mmol/L — ABNORMAL LOW (ref 101–111)
Creatinine, Ser: 1.29 mg/dL — ABNORMAL HIGH (ref 0.61–1.24)
GFR calc Af Amer: >60 mL/min (ref >60)
GFR calc non Af Amer: 57 mL/min — ABNORMAL LOW (ref >60)
Glucose, Bld: 180 mg/dL — ABNORMAL HIGH (ref 70–99)
Potassium: 3.4 mmol/L — ABNORMAL LOW (ref 3.5–5.1)
SODIUM: 133 mmol/L — AB (ref 135–145)

## 2015-01-04 LAB — URINALYSIS, ROUTINE W REFLEX MICROSCOPIC
BILIRUBIN URINE: NEGATIVE
Glucose, UA: NEGATIVE mg/dL
HGB URINE DIPSTICK: NEGATIVE
Ketones, ur: NEGATIVE mg/dL
Nitrite: NEGATIVE
PH: 6.5 (ref 5.0–8.0)
Protein, ur: NEGATIVE mg/dL
Specific Gravity, Urine: 1.024 (ref 1.005–1.030)
Urobilinogen, UA: 1 mg/dL (ref 0.0–1.0)

## 2015-01-04 LAB — CBC WITH DIFFERENTIAL/PLATELET
Basophils Absolute: 0 10*3/uL (ref 0.0–0.1)
Basophils Relative: 0 % (ref 0–1)
Eosinophils Absolute: 0.1 10*3/uL (ref 0.0–0.7)
Eosinophils Relative: 0 % (ref 0–5)
HCT: 40.4 % (ref 39.0–52.0)
Hemoglobin: 13.8 g/dL (ref 13.0–17.0)
Lymphocytes Relative: 6 % — ABNORMAL LOW (ref 12–46)
Lymphs Abs: 0.7 10*3/uL (ref 0.7–4.0)
MCH: 31.2 pg (ref 26.0–34.0)
MCHC: 34.2 g/dL (ref 30.0–36.0)
MCV: 91.2 fL (ref 78.0–100.0)
Monocytes Absolute: 0.8 10*3/uL (ref 0.1–1.0)
Monocytes Relative: 6 % (ref 3–12)
NEUTROS PCT: 88 % — AB (ref 43–77)
Neutro Abs: 11.5 10*3/uL — ABNORMAL HIGH (ref 1.7–7.7)
Platelets: 183 10*3/uL (ref 150–400)
RBC: 4.43 MIL/uL (ref 4.22–5.81)
RDW: 12.6 % (ref 11.5–15.5)
WBC: 13 10*3/uL — ABNORMAL HIGH (ref 4.0–10.5)

## 2015-01-04 LAB — URINE MICROSCOPIC-ADD ON

## 2015-01-04 LAB — CBC
HEMATOCRIT: 38.3 % — AB (ref 39.0–52.0)
Hemoglobin: 13 g/dL (ref 13.0–17.0)
MCH: 31.1 pg (ref 26.0–34.0)
MCHC: 33.9 g/dL (ref 30.0–36.0)
MCV: 91.6 fL (ref 78.0–100.0)
Platelets: 121 10*3/uL — ABNORMAL LOW (ref 150–400)
RBC: 4.18 MIL/uL — ABNORMAL LOW (ref 4.22–5.81)
RDW: 12.8 % (ref 11.5–15.5)
WBC: 7.3 10*3/uL (ref 4.0–10.5)

## 2015-01-04 LAB — TROPONIN I
TROPONIN I: 0.09 ng/mL — AB (ref <0.031)
TROPONIN I: 0.06 ng/mL — AB (ref <0.031)
Troponin I: <0.03 ng/mL (ref <0.031)
Troponin I: <0.03 ng/mL (ref <0.031)

## 2015-01-04 LAB — INFLUENZA PANEL BY PCR (TYPE A & B)
H1N1FLUPCR: NOT DETECTED
Influenza A By PCR: NEGATIVE
Influenza B By PCR: NEGATIVE

## 2015-01-04 LAB — MRSA PCR SCREENING: MRSA by PCR: NEGATIVE

## 2015-01-04 LAB — I-STAT CG4 LACTIC ACID, ED: Lactic Acid, Venous: 0.85 mmol/L (ref 0.5–2.0)

## 2015-01-04 LAB — PROCALCITONIN: PROCALCITONIN: 4.47 ng/mL

## 2015-01-04 LAB — D-DIMER, QUANTITATIVE: D-Dimer, Quant: 0.58 ug/mL-FEU — ABNORMAL HIGH (ref 0.00–0.48)

## 2015-01-04 MED ORDER — ACETAMINOPHEN 500 MG PO TABS
1000.0000 mg | ORAL_TABLET | Freq: Once | ORAL | Status: AC
  Administered 2015-01-04: 1000 mg via ORAL
  Filled 2015-01-04: qty 2

## 2015-01-04 MED ORDER — IOHEXOL 350 MG/ML SOLN
100.0000 mL | Freq: Once | INTRAVENOUS | Status: AC | PRN
Start: 2015-01-04 — End: 2015-01-04
  Administered 2015-01-04: 100 mL via INTRAVENOUS

## 2015-01-04 MED ORDER — VANCOMYCIN HCL IN DEXTROSE 1-5 GM/200ML-% IV SOLN
1000.0000 mg | Freq: Once | INTRAVENOUS | Status: DC
  Filled 2015-01-04: qty 200

## 2015-01-04 MED ORDER — MORPHINE SULFATE 2 MG/ML IJ SOLN
2.0000 mg | INTRAMUSCULAR | Status: DC | PRN
  Administered 2015-01-04 – 2015-01-07 (×4): 2 mg via INTRAVENOUS
  Filled 2015-01-04 (×4): qty 1

## 2015-01-04 MED ORDER — DM-GUAIFENESIN ER 30-600 MG PO TB12
1.0000 | ORAL_TABLET | Freq: Two times a day (BID) | ORAL | Status: DC
  Administered 2015-01-04 – 2015-01-07 (×6): 1 via ORAL
  Filled 2015-01-04 (×7): qty 1

## 2015-01-04 MED ORDER — SODIUM CHLORIDE 0.9 % IV SOLN
1000.0000 mL | Freq: Once | INTRAVENOUS | Status: AC
  Administered 2015-01-04: 1000 mL via INTRAVENOUS

## 2015-01-04 MED ORDER — METOPROLOL TARTRATE 1 MG/ML IV SOLN
5.0000 mg | Freq: Four times a day (QID) | INTRAVENOUS | Status: DC
  Administered 2015-01-04 – 2015-01-05 (×4): 5 mg via INTRAVENOUS
  Filled 2015-01-04 (×7): qty 5

## 2015-01-04 MED ORDER — SODIUM CHLORIDE 0.9 % IV SOLN
1000.0000 mL | INTRAVENOUS | Status: DC
  Administered 2015-01-04 – 2015-01-05 (×4): 1000 mL via INTRAVENOUS

## 2015-01-04 MED ORDER — HEPARIN BOLUS VIA INFUSION
4000.0000 [IU] | INTRAVENOUS | Status: AC
  Administered 2015-01-04: 4000 [IU] via INTRAVENOUS
  Filled 2015-01-04: qty 4000

## 2015-01-04 MED ORDER — LORAZEPAM 2 MG/ML IJ SOLN: 1.0000 mg | Freq: Four times a day (QID) | INTRAMUSCULAR | Status: DC | PRN

## 2015-01-04 MED ORDER — METHYLPREDNISOLONE SODIUM SUCC 125 MG IJ SOLR
60.0000 mg | INTRAMUSCULAR | Status: DC
  Administered 2015-01-04 – 2015-01-05 (×2): 60 mg via INTRAVENOUS
  Filled 2015-01-04: qty 2
  Filled 2015-01-04: qty 0.96

## 2015-01-04 MED ORDER — IPRATROPIUM-ALBUTEROL 0.5-2.5 (3) MG/3ML IN SOLN
3.0000 mL | Freq: Four times a day (QID) | RESPIRATORY_TRACT | Status: DC
  Administered 2015-01-04 – 2015-01-05 (×3): 3 mL via RESPIRATORY_TRACT
  Filled 2015-01-04 (×4): qty 3

## 2015-01-04 MED ORDER — IOHEXOL 300 MG/ML  SOLN
80.0000 mL | Freq: Once | INTRAMUSCULAR | Status: AC | PRN
  Administered 2015-01-04: 80 mL via INTRAVENOUS

## 2015-01-04 MED ORDER — POTASSIUM CHLORIDE CRYS ER 20 MEQ PO TBCR
40.0000 meq | EXTENDED_RELEASE_TABLET | Freq: Once | ORAL | Status: AC
  Administered 2015-01-04: 40 meq via ORAL
  Filled 2015-01-04: qty 2

## 2015-01-04 MED ORDER — DIAZEPAM 5 MG PO TABS
5.0000 mg | ORAL_TABLET | Freq: Two times a day (BID) | ORAL | Status: DC | PRN
  Administered 2015-01-04: 5 mg via ORAL
  Filled 2015-01-04: qty 1

## 2015-01-04 MED ORDER — HEPARIN SODIUM (PORCINE) 5000 UNIT/ML IJ SOLN
5000.0000 [IU] | Freq: Three times a day (TID) | INTRAMUSCULAR | Status: DC
  Administered 2015-01-04 – 2015-01-07 (×8): 5000 [IU] via SUBCUTANEOUS
  Filled 2015-01-04 (×11): qty 1

## 2015-01-04 MED ORDER — PIPERACILLIN-TAZOBACTAM 3.375 G IVPB
3.3750 g | Freq: Three times a day (TID) | INTRAVENOUS | Status: DC
  Administered 2015-01-04 – 2015-01-06 (×6): 3.375 g via INTRAVENOUS
  Filled 2015-01-04 (×10): qty 50

## 2015-01-04 MED ORDER — LISINOPRIL 20 MG PO TABS
20.0000 mg | ORAL_TABLET | Freq: Every day | ORAL | Status: DC
  Administered 2015-01-04 – 2015-01-05 (×2): 20 mg via ORAL
  Filled 2015-01-04 (×3): qty 1

## 2015-01-04 MED ORDER — HEPARIN (PORCINE) IN NACL 100-0.45 UNIT/ML-% IJ SOLN
1350.0000 [IU]/h | INTRAMUSCULAR | Status: DC
  Administered 2015-01-04: 1350 [IU]/h via INTRAVENOUS
  Filled 2015-01-04: qty 250

## 2015-01-04 MED ORDER — ACETAMINOPHEN 325 MG PO TABS
650.0000 mg | ORAL_TABLET | Freq: Four times a day (QID) | ORAL | Status: DC | PRN
  Administered 2015-01-04 – 2015-01-05 (×3): 650 mg via ORAL
  Filled 2015-01-04 (×4): qty 2

## 2015-01-04 MED ORDER — VANCOMYCIN HCL 10 G IV SOLR
1250.0000 mg | Freq: Two times a day (BID) | INTRAVENOUS | Status: DC
  Administered 2015-01-04 – 2015-01-06 (×4): 1250 mg via INTRAVENOUS
  Filled 2015-01-04 (×7): qty 1250

## 2015-01-04 MED ORDER — MORPHINE SULFATE 4 MG/ML IJ SOLN
4.0000 mg | Freq: Once | INTRAMUSCULAR | Status: AC
  Administered 2015-01-04: 4 mg via INTRAVENOUS
  Filled 2015-01-04: qty 1

## 2015-01-04 MED ORDER — ACETAMINOPHEN 650 MG RE SUPP
650.0000 mg | Freq: Four times a day (QID) | RECTAL | Status: DC | PRN
  Administered 2015-01-04: 650 mg via RECTAL
  Filled 2015-01-04: qty 1

## 2015-01-04 MED ORDER — CETYLPYRIDINIUM CHLORIDE 0.05 % MT LIQD
7.0000 mL | Freq: Two times a day (BID) | OROMUCOSAL | Status: DC
  Administered 2015-01-04 – 2015-01-07 (×7): 7 mL via OROMUCOSAL

## 2015-01-04 MED ORDER — PIPERACILLIN-TAZOBACTAM 3.375 G IVPB 30 MIN
3.3750 g | Freq: Once | INTRAVENOUS | Status: DC
  Filled 2015-01-04: qty 50

## 2015-01-04 MED ORDER — PIPERACILLIN-TAZOBACTAM 3.375 G IVPB 30 MIN
3.3750 g | Freq: Once | INTRAVENOUS | Status: AC
  Administered 2015-01-04: 3.375 g via INTRAVENOUS
  Filled 2015-01-04: qty 50

## 2015-01-04 MED ORDER — VANCOMYCIN HCL IN DEXTROSE 1-5 GM/200ML-% IV SOLN
1000.0000 mg | Freq: Once | INTRAVENOUS | Status: AC
  Administered 2015-01-04: 1000 mg via INTRAVENOUS
  Filled 2015-01-04: qty 200

## 2015-01-04 MED ORDER — HYDROCOD POLST-CPM POLST ER 10-8 MG/5ML PO SUER
5.0000 mL | Freq: Once | ORAL | Status: AC
  Administered 2015-01-04: 5 mL via ORAL
  Filled 2015-01-04: qty 5

## 2015-01-04 MED ORDER — OSELTAMIVIR PHOSPHATE 75 MG PO CAPS
75.0000 mg | ORAL_CAPSULE | Freq: Two times a day (BID) | ORAL | Status: DC
  Administered 2015-01-04 – 2015-01-05 (×2): 75 mg via ORAL
  Filled 2015-01-04 (×3): qty 1

## 2015-01-04 NOTE — Progress Notes (Signed)
ANTICOAGULATION CONSULT NOTE - Initial Consult  Pharmacy Consult for Heparin  Indication:  ACS  No Known Allergies  Patient Measurements: Height: 6\' 1"  (185.4 cm) Weight: 215 lb (97.523 kg) IBW/kg (Calculated) : 79.9 Heparin Dosing Weight: 97.5 kg (TBW)  Vital Signs: Temp: 101.1 F (38.4 C) (05/08 0049) Temp Source: Oral (05/08 0049) BP: 135/75 mmHg (05/08 0049) Pulse Rate: 103 (05/08 0049)  Labs:  Recent Labs  01/03/15 2212  HGB 13.8  HCT 40.4  PLT 183  CREATININE 1.29*  TROPONINI 0.09*    Estimated Creatinine Clearance: 72 mL/min (by C-G formula based on Cr of 1.29).   Medical History: Past Medical History  Diagnosis Date  . Hypertension      Assessment: 64 y.o male who presents to ED on 5/7 PM complaining of sharp constant left sided CP that started this am, associated with SOB , nausea , emesis and diaphoresis. Troponin= 0.09. Pharmacy consulted to dose IV heparin infusion. Patient not on any anticoagulation PTA.  CBC wnl.  No bleeding reported.  Goal of Therapy:  Heparin level 0.3-0.7 units/ml Monitor platelets by anticoagulation protocol: Yes   Plan:  Heparin bolus 4000 units IV x1 Heparin drip 1350 units/hr 6 hour heparin level Daily heparin level and CBC   Nicole Cella, RPh Clinical Pharmacist Pager: [phone] 01/04/2015,1:29 AM

## 2015-01-04 NOTE — Significant Event (Signed)
Patient has developed rigors in ED, seen by myself at bedside.  Only major complaint right now is feeling "like I am freezing".  Actual PO temp is 99.3 at this time.  Likely trying to spike a fever, have ordered 1gm tylenol PO.

## 2015-01-04 NOTE — ED Notes (Signed)
Cardiologist and Hospitalist at bedside

## 2015-01-04 NOTE — Progress Notes (Signed)
    Reviewed Dr. Lavonia Drafts note from 4 am today Patient is without pain. Alert. Await ECHO Fever 104 Mild troponin likely demand with infection Tachycardia compensatory with fever.  Will follow.  Candee Furbish, MD

## 2015-01-04 NOTE — Progress Notes (Signed)
ANTIBIOTIC CONSULT NOTE - INITIAL  Pharmacy Consult for Vancomycin and Zosyn Indication: sepsis  No Known Allergies  Patient Measurements: Height: 6\' 1"  (185.4 cm) Weight: 209 lb 14.1 oz (95.2 kg) IBW/kg (Calculated) : 79.9  Vital Signs: Temp: 104.2 F (40.1 C) (05/08 0807) Temp Source: Rectal (05/08 0807) BP: 149/92 mmHg (05/08 0800) Pulse Rate: 129 (05/08 0800) Intake/Output from previous day: 05/07 0701 - 05/08 0700 In: 2300 [I.V.:2300] Out: 845 [Urine:845] Intake/Output from this shift:    Labs:  Recent Labs  01/03/15 2212 01/04/15 0806  WBC 13.0* 7.3  HGB 13.8 13.0  PLT 183 121*  CREATININE 1.29*  --    Estimated Creatinine Clearance: 66.2 mL/min (by C-G formula based on Cr of 1.29). No results for input(s): VANCOTROUGH, VANCOPEAK, VANCORANDOM, GENTTROUGH, GENTPEAK, GENTRANDOM, TOBRATROUGH, TOBRAPEAK, TOBRARND, AMIKACINPEAK, AMIKACINTROU, AMIKACIN in the last 72 hours.   Microbiology: Recent Results (from the past 720 hour(s))  MRSA PCR Screening     Status: None   Collection Time: 01/04/15  6:40 AM  Result Value Ref Range Status   MRSA by PCR NEGATIVE NEGATIVE Final    Comment:        The GeneXpert MRSA Assay (FDA approved for NASAL specimens only), is one component of a comprehensive MRSA colonization surveillance program. It is not intended to diagnose MRSA infection nor to guide or monitor treatment for MRSA infections.     Medical History: Past Medical History  Diagnosis Date  . Hypertension     Medications:  Prescriptions prior to admission  Medication Sig Dispense Refill Last Dose  . acetaminophen (TYLENOL) 325 MG tablet Take 975 mg by mouth every 6 (six) hours as needed.   01/02/2015 at Unknown time  . lisinopril-hydrochlorothiazide (PRINZIDE,ZESTORETIC) 20-25 MG per tablet Take 1 tablet by mouth daily. 30 tablet 1 01/03/2015 at Unknown time  . HYDROcodone-acetaminophen (NORCO/VICODIN) 5-325 MG per tablet Take 1 tablet by mouth 2 (two)  times daily as needed for severe pain. (Patient not taking: Reported on 01/03/2015) 6 tablet 0 Not Taking at Unknown time  . ibuprofen (ADVIL,MOTRIN) 800 MG tablet Take 1 tablet (800 mg total) by mouth every 8 (eight) hours as needed for moderate pain. (Patient not taking: Reported on 01/03/2015) 15 tablet 0 Not Taking at Unknown time   Assessment: 64 y.o. male presents with CP, fever. To begin broad spectrum antibiotics (Vanc and Zosyn) for sepsis. Estimated CrCl 65 ml/min. TM 104.2, tachy, WBC elevated to 13. Unclear source of infection -noted pt with dental infection 2 mos ago. LA wnl. PCT 4.5. Cultures pending. Vanc 1gm and Zosyn 3.375gm given in ED ~0500.  Goal of Therapy:  Vancomycin trough level 15-20 mcg/ml  Plan:  Zosyn 3.375gm IV q8h - each dose over 4hous Vancomycin 1250mg  IV q12h Will f/u renal function, micro data, pt's clinical condition Vanc trough prn  Sherlon Handing, PharmD, BCPS Clinical pharmacist, pager [phone] 01/04/2015,8:44 AM

## 2015-01-04 NOTE — Progress Notes (Signed)
Labs came back Neg for Influenza A and B, H1N1 not detected. Pt. Is currently on tamiflu.  Last rect temp was 102.6 at 1800, complains of chills, Dr. Sherral Hammers notified, will continue to monitor. Logan Knight A

## 2015-01-04 NOTE — Progress Notes (Signed)
TEAM 1 - Stepdown/ICU TEAM Progress Note  KATHAN KIRKER NTZ:[id number] DOB: 1951-08-21 DOA: 01/03/2015 PCP: No PCP Per Patient  Admit HPI / Brief Narrative: Logan Knight is a 64 y.o. BM PMHx  HTN,  who presents to the ED with c/o sharp, constant, left sided chest pain that onset this morning. His pain waxes and wanes, nothing seems to make it worse or better, not associated with position changes or deep breathing. Pain is 7/10 intensity. Has associated nausea, and fever (Tm 101.1 in ED objectively).  No leg pain, no foot pain or ulcers, no dysuria, no diarrhea, no abdominal pain, does have headache that seems to occur with fevers, has improved at this time in ED with defervescence, no meningismus, no facial or dental pain now, did have tooth infection of an upper molar 2 months ago he says for which he was treated with ABx, does have mild right sided sinus congestion but no pain.  HPI/Subjective: 5/8 A/O 4, states~48 hours ago began to have positive SOB, positive F/C/S, positive dry cough, positive headaches associated with his coughing spasms. States never smoker, states negative contacts have been ill.  Assessment/Plan: Sepsis due to pneumonia -Continue empiric antibiotics -Continue normal saline 172ml/hr -Start Tamiflu 75 mg BID -Start DuoNeb QID -Mucinex DM BID -Solu-Medrol 60 mg daily -Continue cooling blanket for temps> 39C  FUO/flu? -Blood cultures pending -Urine culture pending -Recent tooth infection, Dental abscess? Obtain maxillofacial CT -Echocardiogram pending  Chest pain -Most likely secondary to coughing paroxysms, although there could be a cardiac component. -Trend troponins -Echocardiogram pending -If blood cultures positive will need TEE  HTN -Metoprolol 5 mg QID  -Lisinopril 20 mg daily  Hypokalemia -K-Dur 40 mEq 1     Code Status: FULL Family Communication: no family present at time of exam Disposition Plan: Resolution  sepsis    Consultants: Merril Abbe (cardiology)   Procedure/Significant Events: 5/7 CXR; Minimal bibasilar atelectasis 5/8 CT angiogram PE protocol;- negative PE -. Unchanged 2.1 cm right lower lobe nodule (hamartoma)    Culture 5/83 pending 5/8 urine pending 5/8 MRSA by PCR negative 5/8 influenza panel pending 5/80 respiratory virus panel pending   Antibiotics: Zosyn 5/8>> Vancomycin 5/8>>  Tamiflu 5/8>>   DVT prophylaxis: Heparin subcutaneous   Devices    LINES / TUBES:      Continuous Infusions: . sodium chloride 1,000 mL (01/04/15 1242)    Objective: VITAL SIGNS: Temp: 101.6 F (38.7 C) (05/08 1300) Temp Source: Rectal (05/08 1300) BP: 189/106 mmHg (05/08 1430) Pulse Rate: 123 (05/08 1430) SPO2; FIO2:   Intake/Output Summary (Last 24 hours) at 01/04/15 1554 Last data filed at 01/04/15 1322  Gross per 24 hour  Intake 3187.5 ml  Output   2195 ml  Net  992.5 ml     Exam: General: A/O 4, positive Rigors, coughing paroxysms, Acute respiratory distress Lungs: tachypneic, poor air movement diffusely, positive diffuse wheezes, negative crackles Cardiovascular: Tachycardic, Regular rhythm without murmur gallop or rub normal S1 and S2 Abdomen: Nontender, nondistended, soft, bowel sounds positive, no rebound, no ascites, no appreciable mass Extremities: No significant cyanosis, clubbing, or edema bilateral lower extremities  Data Reviewed: Basic Metabolic Panel:  Recent Labs Lab 01/03/15 2212  NA 133*  K 3.4*  CL 97*  CO2 21*  GLUCOSE 180*  BUN 12  CREATININE 1.29*  CALCIUM 9.5   Liver Function Tests: No results for input(s): AST, ALT, ALKPHOS, BILITOT, PROT, ALBUMIN in the last 168 hours. No results for input(s): LIPASE,  AMYLASE in the last 168 hours. No results for input(s): AMMONIA in the last 168 hours. CBC:  Recent Labs Lab 01/03/15 2212 01/04/15 0806  WBC 13.0* 7.3  NEUTROABS 11.5*  --   HGB 13.8 13.0  HCT  40.4 38.3*  MCV 91.2 91.6  PLT 183 121*   Cardiac Enzymes:  Recent Labs Lab 01/03/15 2212 01/04/15 0319 01/04/15 0805  TROPONINI 0.09* <0.03 <0.03   BNP (last 3 results) No results for input(s): BNP in the last 8760 hours.  ProBNP (last 3 results) No results for input(s): PROBNP in the last 8760 hours.  CBG: No results for input(s): GLUCAP in the last 168 hours.  Recent Results (from the past 240 hour(s))  MRSA PCR Screening     Status: None   Collection Time: 01/04/15  6:40 AM  Result Value Ref Range Status   MRSA by PCR NEGATIVE NEGATIVE Final    Comment:        The GeneXpert MRSA Assay (FDA approved for NASAL specimens only), is one component of a comprehensive MRSA colonization surveillance program. It is not intended to diagnose MRSA infection nor to guide or monitor treatment for MRSA infections.      Studies:  Recent x-ray studies have been reviewed in detail by the Attending Physician  Scheduled Meds:  Scheduled Meds: . antiseptic oral rinse  7 mL Mouth Rinse BID  . dextromethorphan-guaiFENesin  1 tablet Oral BID  . heparin  5,000 Units Subcutaneous 3 times per day  . ipratropium-albuterol  3 mL Nebulization Q6H  . lisinopril  20 mg Oral Daily  . methylPREDNISolone (SOLU-MEDROL) injection  60 mg Intravenous Q24H  . metoprolol  5 mg Intravenous 4 times per day  . oseltamivir  75 mg Oral BID  . piperacillin-tazobactam (ZOSYN)  IV  3.375 g Intravenous 3 times per day  . potassium chloride  40 mEq Oral Once  . vancomycin  1,250 mg Intravenous Q12H    Time spent on care of this patient: 40 mins   WOODS, Geraldo Docker , MD  [facility] Office  [phone] Pager [phone]  On-Call/Text Page:      [email or web address]      password [email or web address]  If 7PM-7AM, please contact night-coverage [email or web address] Password [email or web address] 01/04/2015, 3:54 PM   LOS: 0 days   Care during the described time interval was provided by me .  I have reviewed this patient's available  data, including medical history, events of note, physical examination, radiology studies and test results as part of my evaluation  Dia Crawford, MD [phone] Pager

## 2015-01-04 NOTE — Progress Notes (Signed)
Patient with temp of 104.3 rectally. Patient is anxious sitting up in bed, tachycardic in 140's with elevated blood pressure. Blankets removed, rectal tylenol given. MD notified, orders received, cooling blanket ordered. Will continue to monitor.   Willaim Mode R
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
## 2015-01-05 ENCOUNTER — Ambulatory Visit (HOSPITAL_COMMUNITY): Payer: Self-pay

## 2015-01-05 DIAGNOSIS — IMO0001 Reserved for inherently not codable concepts without codable children: Secondary | ICD-10-CM

## 2015-01-05 DIAGNOSIS — Z0389 Encounter for observation for other suspected diseases and conditions ruled out: Secondary | ICD-10-CM

## 2015-01-05 DIAGNOSIS — A419 Sepsis, unspecified organism: Secondary | ICD-10-CM

## 2015-01-05 DIAGNOSIS — R0789 Other chest pain: Secondary | ICD-10-CM

## 2015-01-05 LAB — CBC WITH DIFFERENTIAL/PLATELET
BASOS ABS: 0 10*3/uL (ref 0.0–0.1)
BASOS PCT: 0 % (ref 0–1)
Eosinophils Absolute: 0 10*3/uL (ref 0.0–0.7)
Eosinophils Relative: 0 % (ref 0–5)
HCT: 34.7 % — ABNORMAL LOW (ref 39.0–52.0)
HEMOGLOBIN: 11.9 g/dL — AB (ref 13.0–17.0)
Lymphocytes Relative: 5 % — ABNORMAL LOW (ref 12–46)
Lymphs Abs: 0.6 10*3/uL — ABNORMAL LOW (ref 0.7–4.0)
MCH: 31.3 pg (ref 26.0–34.0)
MCHC: 34.3 g/dL (ref 30.0–36.0)
MCV: 91.3 fL (ref 78.0–100.0)
MONO ABS: 0.5 10*3/uL (ref 0.1–1.0)
Monocytes Relative: 4 % (ref 3–12)
NEUTROS PCT: 91 % — AB (ref 43–77)
Neutro Abs: 10 10*3/uL — ABNORMAL HIGH (ref 1.7–7.7)
Platelets: 150 10*3/uL (ref 150–400)
RBC: 3.8 MIL/uL — ABNORMAL LOW (ref 4.22–5.81)
RDW: 12.9 % (ref 11.5–15.5)
WBC: 11 10*3/uL — ABNORMAL HIGH (ref 4.0–10.5)

## 2015-01-05 LAB — COMPREHENSIVE METABOLIC PANEL
ALBUMIN: 2.5 g/dL — AB (ref 3.5–5.0)
ALT: 17 U/L (ref 17–63)
AST: 21 U/L (ref 15–41)
Alkaline Phosphatase: 47 U/L (ref 38–126)
Anion gap: 7 (ref 5–15)
BUN: 12 mg/dL (ref 6–20)
CALCIUM: 8 mg/dL — AB (ref 8.9–10.3)
CO2: 23 mmol/L (ref 22–32)
Chloride: 105 mmol/L (ref 101–111)
Creatinine, Ser: 1.4 mg/dL — ABNORMAL HIGH (ref 0.61–1.24)
GFR calc Af Amer: >60 mL/min (ref >60)
GFR, EST NON AFRICAN AMERICAN: 52 mL/min — AB (ref >60)
Glucose, Bld: 234 mg/dL — ABNORMAL HIGH (ref 70–99)
Potassium: 3.9 mmol/L (ref 3.5–5.1)
Sodium: 135 mmol/L (ref 135–145)
TOTAL PROTEIN: 6.3 g/dL — AB (ref 6.5–8.1)
Total Bilirubin: 0.8 mg/dL (ref 0.3–1.2)

## 2015-01-05 LAB — URINE CULTURE

## 2015-01-05 LAB — LACTIC ACID, PLASMA: Lactic Acid, Venous: 1.5 mmol/L (ref 0.5–2.0)

## 2015-01-05 LAB — MAGNESIUM: Magnesium: 1.7 mg/dL (ref 1.7–2.4)

## 2015-01-05 MED ORDER — IPRATROPIUM-ALBUTEROL 0.5-2.5 (3) MG/3ML IN SOLN
3.0000 mL | Freq: Four times a day (QID) | RESPIRATORY_TRACT | Status: DC
  Administered 2015-01-05 – 2015-01-06 (×3): 3 mL via RESPIRATORY_TRACT
  Filled 2015-01-05 (×4): qty 3

## 2015-01-05 MED ORDER — METOPROLOL TARTRATE 12.5 MG HALF TABLET
12.5000 mg | ORAL_TABLET | Freq: Two times a day (BID) | ORAL | Status: DC
  Administered 2015-01-05 – 2015-01-07 (×4): 12.5 mg via ORAL
  Filled 2015-01-05 (×5): qty 1

## 2015-01-05 MED ORDER — SODIUM CHLORIDE 0.9 % IV SOLN
INTRAVENOUS | Status: DC
  Administered 2015-01-05: 1000 mL via INTRAVENOUS
  Administered 2015-01-06: 07:00:00 via INTRAVENOUS

## 2015-01-05 MED ORDER — HYDROCOD POLST-CPM POLST ER 10-8 MG/5ML PO SUER
5.0000 mL | Freq: Two times a day (BID) | ORAL | Status: DC | PRN
  Administered 2015-01-05: 5 mL via ORAL
  Filled 2015-01-05: qty 5

## 2015-01-05 MED ORDER — BENZONATATE 100 MG PO CAPS
100.0000 mg | ORAL_CAPSULE | Freq: Three times a day (TID) | ORAL | Status: DC | PRN
  Administered 2015-01-05 – 2015-01-06 (×3): 100 mg via ORAL
  Filled 2015-01-05 (×4): qty 1

## 2015-01-05 NOTE — Progress Notes (Signed)
  Echocardiogram 2D Echocardiogram has been performed.  Diamond Nickel 01/05/2015, 12:42 PM

## 2015-01-05 NOTE — Progress Notes (Signed)
[place] TEAM 1 - Stepdown/ICU TEAM Progress Note  Logan Knight FAO:[id number] DOB: [birth year] DOA: 01/03/2015 PCP: No PCP Per Patient  Admit HPI / Brief Narrative: 64 y.o. M Hx HTN who presented to the ED with c/o sharp, constant, left sided chest pain of acute onset. His pain waxed and waned, with nothing making it worse or better.   HPI/Subjective: Patient complains of ongoing hectic cough which is nonproductive.  He denies current chest pain nausea vomiting headache visual change or neck stiffness.  Assessment/Plan:  SIRS v/s Sepsis due to unclear source  -Continue empiric antibiotics - influenza screen negative - hydrate and recheck CXR in AM - suspect CAP v/s viral pneumonitis   FUO -initial CXR w/o focal infiltrate  -Blood cultures no growth to date -Urine culture pending but UA only very modestly abnormal -CT without evidence of dental abscess  Mild acute renal insufficiency -Hold ACE inhibitor and follow trend with volume resuscitation  Multiple carious teeth -multiple areas noted but with no evidence of current abscess  Chest pain - very mildly elevated troponin -Most likely secondary to coughing paroxysms, although there could be a cardiac component. -Trend troponins -No focal wall motion abnormalities on echocardiogram - unless climbs much higher further w/u not likely to be indicated as inpatient   HTN -Currently reasonably controlled - follow without change today  Hypokalemia -Normalized with replacement  Code Status: FULL Family Communication: no family present at time of exam Disposition Plan: Stable for transfer to medical bed  Consultants: [facility] Cardiology  Procedures: 5/7 CXR; Minimal bibasilar atelectasis 5/8 CT angiogram PE protocol - negative PE - unchanged 2.1 cm right lower lobe nodule (hamartoma)  Antibiotics: Zosyn 5/7 > Vancomycin 5/7 >  DVT prophylaxis: Heparin subcutaneous  Objective: Blood pressure 141/91, pulse 94,  temperature 98.5 F (36.9 C), temperature source Oral, resp. rate 20, height 6\' 1"  (1.854 m), weight 95.2 kg (209 lb 14.1 oz), SpO2 99 %.  Intake/Output Summary (Last 24 hours) at 01/05/15 1535 Last data filed at 01/05/15 1517  Gross per 24 hour  Intake   4045 ml  Output   2260 ml  Net   1785 ml   Exam: General: Alert and oriented in no acute respiratory distress Lungs: No focal crackles or wheezes with good air movement throughout all fields Cardiovascular: Regular rate and rhythm without murmur gallop or rub Abdomen: Nontender, nondistended, soft, bowel sounds positive, no rebound, no ascites, no appreciable mass Extremities: No significant cyanosis, clubbing, or edema bilateral lower extremities  Data Reviewed: Basic Metabolic Panel:  Recent Labs Lab 01/03/15 2212 01/05/15 0300  NA 133* 135  K 3.4* 3.9  CL 97* 105  CO2 21* 23  GLUCOSE 180* 234*  BUN 12 12  CREATININE 1.29* 1.40*  CALCIUM 9.5 8.0*  MG  --  1.7   Liver Function Tests:  Recent Labs Lab 01/05/15 0300  AST 21  ALT 17  ALKPHOS 47  BILITOT 0.8  PROT 6.3*  ALBUMIN 2.5*   CBC:  Recent Labs Lab 01/03/15 2212 01/04/15 0806 01/05/15 0300  WBC 13.0* 7.3 11.0*  NEUTROABS 11.5*  --  10.0*  HGB 13.8 13.0 11.9*  HCT 40.4 38.3* 34.7*  MCV 91.2 91.6 91.3  PLT 183 121* 150   Cardiac Enzymes:  Recent Labs Lab 01/03/15 2212 01/04/15 0319 01/04/15 0805 01/04/15 1607  TROPONINI 0.09* <0.03 <0.03 0.06*    Recent Results (from the past 240 hour(s))  Culture, blood (single)     Status: None (Preliminary result)  Collection Time: 01/04/15  4:15 AM  Result Value Ref Range Status   Specimen Description BLOOD RIGHT HAND  Final   Special Requests BOTTLES DRAWN AEROBIC ONLY 5CC  Final   Culture   Final           BLOOD CULTURE RECEIVED NO GROWTH TO DATE CULTURE WILL BE HELD FOR 5 DAYS BEFORE ISSUING A FINAL NEGATIVE REPORT Performed at [facility]    Report Status PENDING  Incomplete    Culture, blood (x 2)     Status: None (Preliminary result)   Collection Time: 01/04/15  4:18 AM  Result Value Ref Range Status   Specimen Description BLOOD LEFT WRIST  Final   Special Requests BOTTLES DRAWN AEROBIC AND ANAEROBIC 4CC EACH  Final   Culture   Final           BLOOD CULTURE RECEIVED NO GROWTH TO DATE CULTURE WILL BE HELD FOR 5 DAYS BEFORE ISSUING A FINAL NEGATIVE REPORT Performed at [facility]    Report Status PENDING  Incomplete  Culture, blood (x 2)     Status: None (Preliminary result)   Collection Time: 01/04/15  4:28 AM  Result Value Ref Range Status   Specimen Description BLOOD RIGHT ANTECUBITAL  Final   Special Requests BOTTLES DRAWN AEROBIC AND ANAEROBIC [place]  Final   Culture   Final           BLOOD CULTURE RECEIVED NO GROWTH TO DATE CULTURE WILL BE HELD FOR 5 DAYS BEFORE ISSUING A FINAL NEGATIVE REPORT Performed at [facility]    Report Status PENDING  Incomplete  MRSA PCR Screening     Status: None   Collection Time: 01/04/15  6:40 AM  Result Value Ref Range Status   MRSA by PCR NEGATIVE NEGATIVE Final    Comment:        The GeneXpert MRSA Assay (FDA approved for NASAL specimens only), is one component of a comprehensive MRSA colonization surveillance program. It is not intended to diagnose MRSA infection nor to guide or monitor treatment for MRSA infections.      Studies:  Recent x-ray studies have been reviewed in detail by the Attending Physician  Scheduled Meds:  Scheduled Meds: . antiseptic oral rinse  7 mL Mouth Rinse BID  . dextromethorphan-guaiFENesin  1 tablet Oral BID  . heparin  5,000 Units Subcutaneous 3 times per day  . ipratropium-albuterol  3 mL Nebulization Q6H  . lisinopril  20 mg Oral Daily  . methylPREDNISolone (SOLU-MEDROL) injection  60 mg Intravenous Q24H  . metoprolol  5 mg Intravenous 4 times per day  . oseltamivir  75 mg Oral BID  . piperacillin-tazobactam (ZOSYN)  IV  3.375 g Intravenous 3  times per day  . vancomycin  1,250 mg Intravenous Q12H    Time spent on care of this patient: 35 mins  Cherene Altes, MD [facility] For Consults/Admissions - Flow Manager - [phone] Office  [phone]  Contact MD directly via text page:      [email or web address]      password [facility]  01/05/2015, 3:35 PM   LOS: 1 day

## 2015-01-05 NOTE — Progress Notes (Signed)
Inpatient Diabetes Program Recommendations  AACE/ADA: New Consensus Statement on Inpatient Glycemic Control (2013)  Target Ranges:  Prepandial:   less than 140 mg/dL      Peak postprandial:   less than 180 mg/dL (1-2 hours)      Critically ill patients:  140 - 180 mg/dL   Results for DONELLE, BABA (MRN [id number]) as of 01/05/2015 08:13  Ref. Range 01/03/2015 22:12 01/05/2015 03:00  Glucose Latest Ref Range: 70-99 mg/dL 180 (H) 234 (H)   Reason for Visit: SIRS  Diabetes history: None, A1c 6.5% in 2011 Current orders for Inpatient glycemic control: None  Inpatient Diabetes Program Recommendations Correction (SSI): Lab glucose this am was 234 mg/dl. Patient also on IV Solumedrol 60 mg Q24hrs. Please consider starting CBG's and Novolog 0-9 units ACHS while inpatient. HgbA1C: A1c was 6.5% in 2011. No prior history. Hgb at that time normal. Patient was not diagnosed with DM at that time. Please consider obtaining another A1c to assess glucose over the past 2-3 months.  Thanks,  Tama Headings RN, MSN, [facility] Inpatient Diabetes Coordinator Team Pager [phone]

## 2015-01-05 NOTE — Progress Notes (Signed)
    Subjective:  Pt says he feels much better  Objective:  Vital Signs in the last 24 hours: Temp:  [98.4 F (36.9 C)-103.5 F (39.7 C)] 98.6 F (37 C) (05/09 0818) Pulse Rate:  [91-123] 99 (05/09 0818) Resp:  [16-30] 16 (05/09 0818) BP: (105-189)/(61-107) 112/67 mmHg (05/09 1018) SpO2:  [96 %-100 %] 99 % (05/09 0818)  Intake/Output from previous day:  Intake/Output Summary (Last 24 hours) at 01/05/15 1032 Last data filed at 01/05/15 0700  Gross per 24 hour  Intake 3832.5 ml  Output   1960 ml  Net 1872.5 ml    Physical Exam: General appearance: alert, cooperative, no distress and poor dentition Lungs: clear to auscultation bilaterally Heart: regular rate and rhythm Skin: Skin color, texture, turgor normal. No rashes or lesions Neurologic: Grossly normal   Rate: 100  Rhythm: normal sinus rhythm and sinus tachycardia  Lab Results:  Recent Labs  01/04/15 0806 01/05/15 0300  WBC 7.3 11.0*  HGB 13.0 11.9*  PLT 121* 150    Recent Labs  01/03/15 2212 01/05/15 0300  NA 133* 135  K 3.4* 3.9  CL 97* 105  CO2 21* 23  GLUCOSE 180* 234*  BUN 12 12  CREATININE 1.29* 1.40*    Recent Labs  01/04/15 0805 01/04/15 1607  TROPONINI <0.03 0.06*   No results for input(s): INR in the last 72 hours.  Scheduled Meds: . antiseptic oral rinse  7 mL Mouth Rinse BID  . dextromethorphan-guaiFENesin  1 tablet Oral BID  . heparin  5,000 Units Subcutaneous 3 times per day  . ipratropium-albuterol  3 mL Nebulization Q6H  . lisinopril  20 mg Oral Daily  . methylPREDNISolone (SOLU-MEDROL) injection  60 mg Intravenous Q24H  . metoprolol  5 mg Intravenous 4 times per day  . oseltamivir  75 mg Oral BID  . piperacillin-tazobactam (ZOSYN)  IV  3.375 g Intravenous 3 times per day  . vancomycin  1,250 mg Intravenous Q12H   Continuous Infusions: . sodium chloride 1,000 mL (01/05/15 0121)   PRN Meds:.acetaminophen **OR** acetaminophen, LORazepam, morphine  injection   Imaging: Imaging results have been reviewed  Cardiac Studies: Echo done bedside 01/03/15 Official Echo results pending Assessment/Plan:  64 y.o.AA male who presented to the ED 01/03/15 with c/o sharp, constant, left sided chest pain. He was noted to be febrile and had a WBC od 13K. Troponin slightly elevated-0.09. History of normal coronaries by CT 2013.   Principal Problem:   SIRS (systemic inflammatory response syndrome) Active Problems:   FUO (fever of unknown origin)   Essential hypertension   Chest pain   Elevated troponin   Hypokalemia   Normal coronary arteries by coronary CT 2013   PLAN: Work up per primary service. Cultures negative so far. Consider holding ACE as his SCr is elevated this am- he already received today's dose.   Kerin Ransom PA-C 01/05/2015, 10:32 AM [phone]  Personally seen and examined. Agree with above. Fever subsided Await ECHO Mildly elevated troponin in setting of fever Normal cors in 2013 Agree with holding ACE-I  Candee Furbish, MD

## 2015-01-06 ENCOUNTER — Inpatient Hospital Stay (HOSPITAL_COMMUNITY): Payer: Self-pay

## 2015-01-06 DIAGNOSIS — Z0389 Encounter for observation for other suspected diseases and conditions ruled out: Secondary | ICD-10-CM

## 2015-01-06 LAB — CBC
HCT: 35.7 % — ABNORMAL LOW (ref 39.0–52.0)
Hemoglobin: 12 g/dL — ABNORMAL LOW (ref 13.0–17.0)
MCH: 30.6 pg (ref 26.0–34.0)
MCHC: 33.6 g/dL (ref 30.0–36.0)
MCV: 91.1 fL (ref 78.0–100.0)
PLATELETS: 156 10*3/uL (ref 150–400)
RBC: 3.92 MIL/uL — ABNORMAL LOW (ref 4.22–5.81)
RDW: 12.8 % (ref 11.5–15.5)
WBC: 9.9 10*3/uL (ref 4.0–10.5)

## 2015-01-06 LAB — COMPREHENSIVE METABOLIC PANEL
ALBUMIN: 2.5 g/dL — AB (ref 3.5–5.0)
ALK PHOS: 54 U/L (ref 38–126)
ALT: 21 U/L (ref 17–63)
ANION GAP: 10 (ref 5–15)
AST: 20 U/L (ref 15–41)
BUN: 15 mg/dL (ref 6–20)
CHLORIDE: 106 mmol/L (ref 101–111)
CO2: 19 mmol/L — ABNORMAL LOW (ref 22–32)
Calcium: 8.2 mg/dL — ABNORMAL LOW (ref 8.9–10.3)
Creatinine, Ser: 1.3 mg/dL — ABNORMAL HIGH (ref 0.61–1.24)
GFR calc Af Amer: >60 mL/min (ref >60)
GFR calc non Af Amer: 57 mL/min — ABNORMAL LOW (ref >60)
Glucose, Bld: 277 mg/dL — ABNORMAL HIGH (ref 70–99)
POTASSIUM: 4.4 mmol/L (ref 3.5–5.1)
Sodium: 135 mmol/L (ref 135–145)
Total Bilirubin: 0.7 mg/dL (ref 0.3–1.2)
Total Protein: 6.6 g/dL (ref 6.5–8.1)

## 2015-01-06 LAB — GLUCOSE, CAPILLARY
Glucose-Capillary: 106 mg/dL — ABNORMAL HIGH (ref 70–99)
Glucose-Capillary: 119 mg/dL — ABNORMAL HIGH (ref 70–99)

## 2015-01-06 LAB — TROPONIN I

## 2015-01-06 MED ORDER — INSULIN ASPART 100 UNIT/ML ~~LOC~~ SOLN: 0.0000 [IU] | Freq: Every day | SUBCUTANEOUS | Status: DC

## 2015-01-06 MED ORDER — INSULIN ASPART 100 UNIT/ML ~~LOC~~ SOLN
0.0000 [IU] | Freq: Three times a day (TID) | SUBCUTANEOUS | Status: DC
  Administered 2015-01-07: 2 [IU] via SUBCUTANEOUS

## 2015-01-06 MED ORDER — IPRATROPIUM-ALBUTEROL 0.5-2.5 (3) MG/3ML IN SOLN: 3.0000 mL | RESPIRATORY_TRACT | Status: DC | PRN

## 2015-01-06 MED ORDER — LEVOFLOXACIN 750 MG PO TABS
750.0000 mg | ORAL_TABLET | ORAL | Status: DC
  Administered 2015-01-06 – 2015-01-07 (×2): 750 mg via ORAL
  Filled 2015-01-06 (×2): qty 1

## 2015-01-06 NOTE — Progress Notes (Signed)
Progress Note  Logan Knight BSJ:[id number] DOB: Sep 10, 1950 DOA: 01/03/2015 PCP: No PCP Per Patient  Admit HPI / Brief Narrative: 64 y.o. M Hx HTN who presented to the ED with c/o sharp, constant, left sided chest pain of acute onset. His pain waxed and waned, with nothing making it worse or better.   HPI/Subjective: Feeling better Still with occasional coughing fit  Assessment/Plan:  SIRS v/s Sepsis due to unclear source  -Continue empiric antibiotics- de-escalate to PO - influenza screen negative -  -suspect viral pneumonitis- no issues pna on repeat x ray  FUO -initial CXR w/o focal infiltrate  -Blood cultures no growth to date -Urine culture pending but UA only very modestly abnormal -CT without evidence of dental abscess Resolved -await NP swab  Mild acute renal insufficiency -Hold ACE inhibitor and follow trend with volume resuscitation  Multiple carious teeth -multiple areas noted but with no evidence of current abscess  Chest pain - very mildly elevated troponin -cards saw -No focal wall motion abnormalities on echocardiogram - unless climbs much higher further w/u not likely to be indicated as inpatient   HTN -Currently reasonably controlled - follow without change today  Hypokalemia -Normalized with replacement  Hyperglycemia -HgbA1C -SSI -suspect will have DM   Code Status: FULL Family Communication: no family present at time of exam Disposition Plan: home in AM??  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 rate 16, height 6\' 1"  (1.854 m), weight 96.389 kg (212 lb 8 oz), SpO2 97 %.  Intake/Output Summary (Last 24  hours) at 01/06/15 1252 Last data filed at 01/06/15 1154  Gross per 24 hour  Intake 2236.67 ml  Output   1305 ml  Net 931.67 ml   Exam: General: Alert and oriented in no acute respiratory distress Lungs: No wheezing Cardiovascular: Regular rate and rhythm without murmur gallop or rub Abdomen: Nontender, nondistended, soft, bowel sounds positive, no rebound, no ascites, no appreciable mass Extremities: No significant cyanosis, clubbing, or edema bilateral lower extremities  Data Reviewed: Basic Metabolic Panel:  Recent Labs Lab 01/03/15 2212 01/05/15 0300 01/06/15 0540  NA 133* 135 135  K 3.4* 3.9 4.4  CL 97* 105 106  CO2 21* 23 19*  GLUCOSE 180* 234* 277*  BUN 12 12 15   CREATININE 1.29* 1.40* 1.30*  CALCIUM 9.5 8.0* 8.2*  MG  --  1.7  --    Liver Function Tests:  Recent Labs Lab 01/05/15 0300 01/06/15 0540  AST 21 20  ALT 17 21  ALKPHOS 47 54  BILITOT 0.8 0.7  PROT 6.3* 6.6  ALBUMIN 2.5* 2.5*   CBC:  Recent Labs Lab 01/03/15 2212 01/04/15 0806 01/05/15 0300 01/06/15 0540  WBC 13.0* 7.3 11.0* 9.9  NEUTROABS 11.5*  --  10.0*  --   HGB 13.8 13.0 11.9* 12.0*  HCT 40.4 38.3* 34.7* 35.7*  MCV 91.2 91.6 91.3 91.1  PLT 183 121* 150 156   Cardiac Enzymes:  Recent Labs Lab 01/03/15 2212 01/04/15 0319 01/04/15 0805 01/04/15 1607 01/06/15 0540  TROPONINI 0.09* <0.03 <0.03 0.06* <0.03    Recent Results (from the past 240 hour(s))  Culture, blood (single)     Status: None (Preliminary result)   Collection Time:  01/04/15  4:15 AM  Result Value Ref Range Status   Specimen Description BLOOD RIGHT HAND  Final   Special Requests BOTTLES DRAWN AEROBIC ONLY 5CC  Final   Culture   Final           BLOOD CULTURE RECEIVED NO GROWTH TO DATE CULTURE WILL BE HELD FOR 5 DAYS BEFORE ISSUING A FINAL NEGATIVE REPORT Performed at [facility]    Report Status PENDING  Incomplete  Culture, blood (x 2)     Status: None (Preliminary result)   Collection Time:  01/04/15  4:18 AM  Result Value Ref Range Status   Specimen Description BLOOD LEFT WRIST  Final   Special Requests BOTTLES DRAWN AEROBIC AND ANAEROBIC 4CC EACH  Final   Culture   Final           BLOOD CULTURE RECEIVED NO GROWTH TO DATE CULTURE WILL BE HELD FOR 5 DAYS BEFORE ISSUING A FINAL NEGATIVE REPORT Performed at [facility]    Report Status PENDING  Incomplete  Culture, blood (x 2)     Status: None (Preliminary result)   Collection Time: 01/04/15  4:28 AM  Result Value Ref Range Status   Specimen Description BLOOD RIGHT ANTECUBITAL  Final   Special Requests BOTTLES DRAWN AEROBIC AND ANAEROBIC Datto  Final   Culture   Final           BLOOD CULTURE RECEIVED NO GROWTH TO DATE CULTURE WILL BE HELD FOR 5 DAYS BEFORE ISSUING A FINAL NEGATIVE REPORT Performed at [facility]    Report Status PENDING  Incomplete  MRSA PCR Screening     Status: None   Collection Time: 01/04/15  6:40 AM  Result Value Ref Range Status   MRSA by PCR NEGATIVE NEGATIVE Final    Comment:        The GeneXpert MRSA Assay (FDA approved for NASAL specimens only), is one component of a comprehensive MRSA colonization surveillance program. It is not intended to diagnose MRSA infection nor to guide or monitor treatment for MRSA infections.   Culture, Urine     Status: None   Collection Time: 01/04/15  3:30 PM  Result Value Ref Range Status   Specimen Description URINE, CLEAN CATCH  Final   Special Requests NONE  Final   Colony Count   Final    6,000 COLONIES/ML Performed at [facility]    Culture   Final    INSIGNIFICANT GROWTH Performed at [facility]    Report Status 01/05/2015 FINAL  Final       Scheduled Meds:  Scheduled Meds: . antiseptic oral rinse  7 mL Mouth Rinse BID  . dextromethorphan-guaiFENesin  1 tablet Oral BID  . heparin  5,000 Units Subcutaneous 3 times per day  . metoprolol tartrate  12.5 mg Oral BID  . piperacillin-tazobactam (ZOSYN)   IV  3.375 g Intravenous 3 times per day  . vancomycin  1,250 mg Intravenous Q12H    Time spent on care of this patient: 25 mins  Eulogio Bear [facility] [phone]  Contact MD directly via text page:      [email or web address]      password [email or web address]  01/06/2015, 12:52 PM   LOS: 2 days

## 2015-01-06 NOTE — Care Management Note (Signed)
Case Management Note  Patient Details  Name: Logan Knight MRN: [id number] Date of Birth: 31-Jul-1951  Subjective/Objective:           Patient has no pcp ,will need  A f/u apt at the [facility] clinic.  NCM will cont to follow for dc needs.   Action/Plan:   Expected Discharge Date:                  Expected Discharge Plan:  Home/Self Care  In-House Referral:     Discharge planning Services  CM Consult, [facility], Medication Assistance  Post Acute Care Choice:    Choice offered to:     DME Arranged:    DME Agency:     HH Arranged:    HH Agency:     Status of Service:  In process, will continue to follow  Medicare Important Message Given:  No Date Medicare IM Given:    Medicare IM give by:    Date Additional Medicare IM Given:    Additional Medicare Important Message give by:     If discussed at [place] of Stay Meetings, dates discussed:    Additional Comments:  Zenon Mayo, RN 01/06/2015, 12:15 PM
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
## 2015-01-06 NOTE — Progress Notes (Signed)
Pt IV fell out during shower. Pt being discharged tomorrow. Notifed MD, order received to keep IV out. IV abx given and pt tolerating fluids well. Will continue to monitor pt closely. Leanne Chang, RN

## 2015-01-06 NOTE — Progress Notes (Signed)
Patient Name: Logan Knight Date of Encounter: 01/06/2015  Primary Cardiologist: none   Principal Problem:   SIRS (systemic inflammatory response syndrome) Active Problems:   Essential hypertension   Chest pain   Elevated troponin   FUO (fever of unknown origin)   Hypokalemia   Normal coronary arteries by coronary CT 2013    SUBJECTIVE  Denies any CP. Still has frequent cough. Denies any SOB. Feels like symptom improving.   CURRENT MEDS . antiseptic oral rinse  7 mL Mouth Rinse BID  . dextromethorphan-guaiFENesin  1 tablet Oral BID  . heparin  5,000 Units Subcutaneous 3 times per day  . ipratropium-albuterol  3 mL Nebulization QID  . metoprolol tartrate  12.5 mg Oral BID  . piperacillin-tazobactam (ZOSYN)  IV  3.375 g Intravenous 3 times per day  . vancomycin  1,250 mg Intravenous Q12H    OBJECTIVE  Filed Vitals:   01/05/15 2125 01/06/15 0555 01/06/15 0559 01/06/15 0927  BP:  154/97 158/97   Pulse:  82 88   Temp:  98.5 F (36.9 C) 98.6 F (37 C)   TempSrc:  Oral Oral   Resp:  16 16   Height:      Weight:      SpO2: 99% 99% 99% 97%    Intake/Output Summary (Last 24 hours) at 01/06/15 1212 Last data filed at 01/06/15 1154  Gross per 24 hour  Intake 2236.67 ml  Output   1305 ml  Net 931.67 ml   Filed Weights   01/04/15 0128 01/04/15 0600 01/05/15 2054  Weight: 215 lb (97.523 kg) 209 lb 14.1 oz (95.2 kg) 212 lb 8 oz (96.389 kg)    PHYSICAL EXAM  General: Pleasant, NAD. Neuro: Alert and oriented X 3. Moves all extremities spontaneously. Psych: Normal affect. HEENT:  Normal  Neck: Supple without bruits or JVD. Lungs:  Resp regular and unlabored. Decreased breath sound near bilateral bases, otherwise CTA Heart: RRR no s3, s4, or murmurs. Abdomen: Soft, non-tender, non-distended, BS + x 4.  Extremities: No clubbing, cyanosis or edema. DP/PT/Radials 2+ and equal bilaterally.  Accessory Clinical Findings  CBC  Recent Labs  01/03/15 2212   01/05/15 0300 01/06/15 0540  WBC 13.0*  < > 11.0* 9.9  NEUTROABS 11.5*  --  10.0*  --   HGB 13.8  < > 11.9* 12.0*  HCT 40.4  < > 34.7* 35.7*  MCV 91.2  < > 91.3 91.1  PLT 183  < > 150 156  < > = values in this interval not displayed. Basic Metabolic Panel  Recent Labs  01/05/15 0300 01/06/15 0540  NA 135 135  K 3.9 4.4  CL 105 106  CO2 23 19*  GLUCOSE 234* 277*  BUN 12 15  CREATININE 1.40* 1.30*  CALCIUM 8.0* 8.2*  MG 1.7  --    Liver Function Tests  Recent Labs  01/05/15 0300 01/06/15 0540  AST 21 20  ALT 17 21  ALKPHOS 47 54  BILITOT 0.8 0.7  PROT 6.3* 6.6  ALBUMIN 2.5* 2.5*   Cardiac Enzymes  Recent Labs  01/04/15 0805 01/04/15 1607 01/06/15 0540  TROPONINI <0.03 0.06* <0.03   D-Dimer  Recent Labs  01/03/15 2212  DDIMER 0.58*    TELE Off telemetry    ECG  No new EKG  Echocardiogram 01/05/2015  LV EF: 55% -  60%  ------------------------------------------------------------------- Indications:   Sepsis 038.9Septic shock 785.59.  ------------------------------------------------------------------- History:  PMH: Tooth infection. Questionable endocarditis.  ------------------------------------------------------------------- Study Conclusions  -  Left ventricle: The cavity size was normal. There was severe focal basal and mild concentric hypertrophy. Systolic function was normal. The estimated ejection fraction was in the range of 55% to 60%. Wall motion was normal; there were no regional wall motion abnormalities. - Mitral valve: There was mild regurgitation. - Left atrium: The atrium was mildly dilated. - Tricuspid valve: There was mild regurgitation. - Pulmonic valve: There was mild regurgitation.    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   Electronically Signed   By:  Logan Bores   On: 01/04/2015 02:30   01/04/2015   CLINICAL DATA:  Shortness of breath, cough, fever, left-sided chest pain, nausea, and vomiting.  EXAM: CHEST  2 VIEW  COMPARISON:  09/13/2014  FINDINGS: The cardiomediastinal silhouette is within normal limits. Minimal bibasilar opacities are most suggestive of atelectasis. No lobar consolidation, edema, pleural effusion, or pneumothorax is identified. Right upper quadrant abdominal surgical clips are noted. No acute osseous abnormality is seen.  IMPRESSION: Minimal bibasilar atelectasis.  Electronically Signed: By: Logan Bores On: 01/04/2015 00:59   Ct Angio Chest Pe W/cm &/or Wo Cm  01/04/2015   CLINICAL DATA:  Left-sided chest pain. Coughing with deep inspiration.  EXAM: CT ANGIOGRAPHY CHEST WITH CONTRAST  TECHNIQUE: Multidetector CT imaging of the chest was performed using the standard protocol during bolus administration of intravenous contrast. Multiplanar CT image reconstructions and MIPs were obtained to evaluate the vascular anatomy.  CONTRAST:  162mL OMNIPAQUE IOHEXOL 350 MG/ML SOLN  COMPARISON:  06/22/2012 cardiac CTA.  08/05/2009 chest CT.  FINDINGS: Pulmonary arterial opacification is adequate without evidence emboli. No enlarged axillary, mediastinal, or hilar lymph nodes are identified. Heart is within normal limits for size.  There is no pleural or pericardial effusion. 2.1 x 1.9 cm nodule in the posterior right lower lobe is unchanged from the prior CTA with areas of hypoattenuation compatible with fat and overall consistent with a hamartoma as previously described. Subsegmental atelectasis is present in the lung bases.  1.8 cm low-density lesion in the left hepatic lobe is unchanged from 2010 and consistent with a cyst. Diffuse bridging osteophytosis is present in the thoracic spine.  Review of the MIP images confirms the above findings.  IMPRESSION: 1. No evidence of pulmonary emboli. 2. 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   Electronically Signed   By: Logan Bores   On: 01/04/2015 02:26   Ct Maxillofacial W/cm  01/04/2015   CLINICAL DATA:  Tooth infection in the left maxillary region 3 months ago, current fevers  EXAM: CT MAXILLOFACIAL WITH CONTRAST  TECHNIQUE: Multidetector CT imaging of the maxillofacial structures was performed with intravenous contrast. Multiplanar CT image reconstructions were also generated. A small metallic BB was placed on the right temple in order to reliably differentiate right from left.  CONTRAST:  70mL OMNIPAQUE IOHEXOL 300 MG/ML  SOLN  COMPARISON:  None.  FINDINGS: No acute fracture is identified. There are changes consistent with dental caries within many of the teeth particularly the maxillary molars bilaterally. Significant bony loss is noted surrounding the medial aspect first maxillary molar on the left. Bony irregularity is noted along the posterior aspect of the maxilla on the left which may be related to the prior infection. The possibility of osteomyelitis in this region would deserve consideration although no focal  abscess or areas of abnormal enhancement are identified.  The orbits and their contents are within normal limits. The paranasal sinuses appear within normal limits. No other focal soft tissue abnormality is noted.  IMPRESSION: Multiple dental caries.  Areas of bony loss related to the dental disease bilaterally but worst on the left in the maxilla particularly along the medial aspect of the first maxillary molar.  Area of mild irregular bony loss in the posterior aspect of the maxilla on the left. This is best seen on image number 33 of series 205 (coronal bony windows). This is likely related to the recent infection although the possibility of active osteomyelitis could not be totally excluded. No surrounding soft tissue abscess is seen.   Electronically Signed   By: Inez Catalina M.D.   On: 01/04/2015 18:11   Dg Chest Port 1 View  01/06/2015   CLINICAL DATA:   64 year old male with fever of unknown origin. Initial encounter.  EXAM: PORTABLE CHEST - 1 VIEW  COMPARISON:  Chest CTA 01/04/2015 and earlier.  FINDINGS: Portable AP upright view at 0701 hrs. Lower lung volumes. Stable cardiac size and mediastinal contours. No pneumothorax, pulmonary edema, pleural effusion or confluent pulmonary opacity. Stable visualized osseous structures.  IMPRESSION: Lower lung volumes, no acute cardiopulmonary abnormality.   Electronically Signed   By: Genevie Ann M.D.   On: 01/06/2015 08:09    ASSESSMENT AND PLAN  64 y.o.AA male who presented to the ED 01/03/15 with c/o sharp, constant, left sided chest pain. He was noted to be febrile and had a WBC od 13K. Troponin slightly elevated-0.09. History of normal coronaries by CT 2013.   1. Mildly elevated trop likely demand ischemia in the setting of inflammatory process  - trop 0.09 --> 0.03 --> 0.006 --> 0.03, no EKG change. CTA negative for PE  - coronary CTA 06/22/2012 no CAD with calcium score of 0  - Echo EF 55-60%, no RWMA, mild MR/TR/PR  - given lack of symptom, no clear trend with trop, normal EF and lack of WMA on echo, unlikely to need further inpatient workup.  2. Sharp contact L sided CP  3. Fever with tachycardia concerning for SIRS  4. 2.1 cm RLL nodule on CTA  5. Acute bronchitis  Signed, Almyra Deforest PA-C Pager: [phone]  Personally seen and examined. Agree with above. Getting better, less cough No further cardiac workup. Last CTA 0 Ca score ECHO here with normal EF, no veg.  Will sign off Call with ?  Candee Furbish, MD

## 2015-01-07 DIAGNOSIS — J208 Acute bronchitis due to other specified organisms: Secondary | ICD-10-CM | POA: Insufficient documentation

## 2015-01-07 LAB — BASIC METABOLIC PANEL
Anion gap: 9 (ref 5–15)
BUN: 17 mg/dL (ref 6–20)
CALCIUM: 8.9 mg/dL (ref 8.9–10.3)
CO2: 23 mmol/L (ref 22–32)
CREATININE: 1.36 mg/dL — AB (ref 0.61–1.24)
Chloride: 107 mmol/L (ref 101–111)
GFR, EST NON AFRICAN AMERICAN: 54 mL/min — AB (ref >60)
GLUCOSE: 147 mg/dL — AB (ref 70–99)
POTASSIUM: 3.7 mmol/L (ref 3.5–5.1)
Sodium: 139 mmol/L (ref 135–145)

## 2015-01-07 LAB — CBC
HCT: 36.5 % — ABNORMAL LOW (ref 39.0–52.0)
Hemoglobin: 12.2 g/dL — ABNORMAL LOW (ref 13.0–17.0)
MCH: 30.3 pg (ref 26.0–34.0)
MCHC: 33.4 g/dL (ref 30.0–36.0)
MCV: 90.6 fL (ref 78.0–100.0)
Platelets: 196 10*3/uL (ref 150–400)
RBC: 4.03 MIL/uL — AB (ref 4.22–5.81)
RDW: 12.7 % (ref 11.5–15.5)
WBC: 9.7 10*3/uL (ref 4.0–10.5)

## 2015-01-07 LAB — RESPIRATORY VIRUS PANEL
Adenovirus: NEGATIVE
Influenza A: NEGATIVE
Influenza B: NEGATIVE
Metapneumovirus: NEGATIVE
PARAINFLUENZA 1 A: NEGATIVE
PARAINFLUENZA 2 A: NEGATIVE
PARAINFLUENZA 3 A: NEGATIVE
Respiratory Syncytial Virus A: NEGATIVE
Respiratory Syncytial Virus B: NEGATIVE
Rhinovirus: NEGATIVE

## 2015-01-07 LAB — HEMOGLOBIN A1C
Hgb A1c MFr Bld: 6.6 % — ABNORMAL HIGH (ref 4.8–5.6)
Mean Plasma Glucose: 143 mg/dL

## 2015-01-07 LAB — GLUCOSE, CAPILLARY
Glucose-Capillary: 143 mg/dL — ABNORMAL HIGH (ref 70–99)
Glucose-Capillary: 99 mg/dL (ref 70–99)

## 2015-01-07 MED ORDER — METOPROLOL TARTRATE 25 MG PO TABS: 12.5000 mg | ORAL_TABLET | Freq: Two times a day (BID) | ORAL | Status: DC

## 2015-01-07 MED ORDER — BENZONATATE 100 MG PO CAPS: 100.0000 mg | ORAL_CAPSULE | Freq: Three times a day (TID) | ORAL | Status: DC | PRN

## 2015-01-07 MED ORDER — LEVOFLOXACIN 750 MG PO TABS: 750.0000 mg | ORAL_TABLET | ORAL | Status: DC

## 2015-01-07 MED ORDER — ALBUTEROL SULFATE HFA 108 (90 BASE) MCG/ACT IN AERS: 2.0000 | INHALATION_SPRAY | Freq: Four times a day (QID) | RESPIRATORY_TRACT | Status: DC | PRN

## 2015-01-07 NOTE — Discharge Summary (Addendum)
Physician Discharge Summary  Logan Knight XKG:[id number] DOB: 10/22/1950 DOA: 01/03/2015  PCP: No PCP Per Patient  Admit date: 01/03/2015 Discharge date: 01/07/2015  Time spent: 35 minutes  Recommendations for Outpatient Follow-up:  1. HgbA1C 6.6  Discharge Diagnoses:  Principal Problem:   SIRS (systemic inflammatory response syndrome) Active Problems:   Essential hypertension   Chest pain   Elevated troponin   FUO (fever of unknown origin)   Hypokalemia   Normal coronary arteries by coronary CT 2013   Discharge Condition: improved  Diet recommendation: cardiac/diabetic  Filed Weights   01/04/15 0128 01/04/15 0600 01/05/15 2054  Weight: 97.523 kg (215 lb) 95.2 kg (209 lb 14.1 oz) 96.389 kg (212 lb 8 oz)    History of present illness:  Logan Knight is a 64 y.o. male who presents to the ED with c/o sharp, constant, left sided chest pain that onset this morning. His pain waxes and wanes, nothing seems to make it worse or better, not associated with position changes or deep breathing. Pain is 7/10 intensity. Has associated nausea, and fever (Tm 101.1 in ED objectively).  No leg pain, no foot pain or ulcers, no dysuria, no diarrhea, no abdominal pain, does have headache that seems to occur with fevers, has improved at this time in ED with defervescence, no meningismus, no facial or dental pain now, did have tooth infection of an upper molar 2 months ago he says for which he was treated with ABx, does have mild right sided sinus congestion but no pain  Hospital Course:  Sepsis due to unclear source - viral pneumonia is treatment -Continue empiric antibiotics- de-escalate to PO - influenza screen negative -  -suspect viral pneumonitis- no issues pna on repeat x ray  FUO -initial CXR w/o focal infiltrate  -Blood cultures no growth to date -Urine culture pending but UA only very modestly abnormal -CT without evidence of dental abscess Resolved -NP swab  negative  Mild acute renal insufficiency -Hold ACE inhibitor and follow trend with volume resuscitation  Multiple carious teeth -multiple areas noted but with no evidence of current abscess  Chest pain - very mildly elevated troponin -cards saw -No focal wall motion abnormalities on echocardiogram - unless climbs much higher further w/u not likely to be indicated as inpatient   HTN -Currently reasonably controlled - follow without change today  Hypokalemia -Normalized with replacement  Hyperglycemia -HgbA1C -SSI -suspect will have DM  Procedures:    Consultations:    Discharge Exam: Filed Vitals:   01/07/15 0552  BP: 136/81  Pulse: 79  Temp: 99.6 F (37.6 C)  Resp: 15    Discharge Instructions   Discharge Instructions    Diet - low sodium heart healthy    Complete by:  As directed      Diet Carb Modified    Complete by:  As directed      Discharge instructions    Complete by:  As directed   No smoking or being around smoke BMP 2 weeks Follow up on HgbA1C Wear mask while doing yard work     Increase activity slowly    Complete by:  As directed           Current Discharge Medication List    START taking these medications   Details  benzonatate (TESSALON) 100 MG capsule Take 1 capsule (100 mg total) by mouth 3 (three) times daily as needed for cough. Qty: 20 capsule, Refills: 0    levofloxacin (LEVAQUIN) 750 MG tablet  Take 1 tablet (750 mg total) by mouth daily. Qty: 3 tablet, Refills: 0    metoprolol tartrate (LOPRESSOR) 25 MG tablet Take 0.5 tablets (12.5 mg total) by mouth 2 (two) times daily. Qty: 60 tablet, Refills: 0      CONTINUE these medications which have NOT CHANGED   Details  acetaminophen (TYLENOL) 325 MG tablet Take 975 mg by mouth every 6 (six) hours as needed.      STOP taking these medications     lisinopril-hydrochlorothiazide (PRINZIDE,ZESTORETIC) 20-25 MG per tablet      HYDROcodone-acetaminophen (NORCO/VICODIN) 5-325  MG per tablet      ibuprofen (ADVIL,MOTRIN) 800 MG tablet        No Known Allergies    The results of significant diagnostics from this hospitalization (including imaging, microbiology, ancillary and laboratory) are listed below for reference.    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   Electronically Signed   By: Logan Bores   On: 01/04/2015 02:30   01/04/2015   CLINICAL DATA:  Shortness of breath, cough, fever, left-sided chest pain, nausea, and vomiting.  EXAM: CHEST  2 VIEW  COMPARISON:  09/13/2014  FINDINGS: The cardiomediastinal silhouette is within normal limits. Minimal bibasilar opacities are most suggestive of atelectasis. No lobar consolidation, edema, pleural effusion, or pneumothorax is identified. Right upper quadrant abdominal surgical clips are noted. No acute osseous abnormality is seen.  IMPRESSION: Minimal bibasilar atelectasis.  Electronically Signed: By: Logan Bores On: 01/04/2015 00:59   Ct Angio Chest Pe W/cm &/or Wo Cm  01/04/2015   CLINICAL DATA:  Left-sided chest pain. Coughing with deep inspiration.  EXAM: CT ANGIOGRAPHY CHEST WITH CONTRAST  TECHNIQUE: Multidetector CT imaging of the chest was performed using the standard protocol during bolus administration of intravenous contrast. Multiplanar CT image reconstructions and MIPs were obtained to evaluate the vascular anatomy.  CONTRAST:  175mL OMNIPAQUE IOHEXOL 350 MG/ML SOLN  COMPARISON:  06/22/2012 cardiac CTA.  08/05/2009 chest CT.  FINDINGS: Pulmonary arterial opacification is adequate without evidence emboli. No enlarged axillary, mediastinal, or hilar lymph nodes are identified. Heart is within normal limits for size.  There is no pleural or pericardial effusion. 2.1 x 1.9 cm nodule in the posterior right lower lobe is unchanged from the prior CTA with  areas of hypoattenuation compatible with fat and overall consistent with a hamartoma as previously described. Subsegmental atelectasis is present in the lung bases.  1.8 cm low-density lesion in the left hepatic lobe is unchanged from 2010 and consistent with a cyst. Diffuse bridging osteophytosis is present in the thoracic spine.  Review of the MIP images confirms the above findings.  IMPRESSION: 1. No evidence of pulmonary emboli. 2. 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   Electronically Signed   By: Logan Bores   On: 01/04/2015 02:26   Ct Maxillofacial W/cm  01/04/2015   CLINICAL DATA:  Tooth infection in the left maxillary region 3 months ago, current fevers  EXAM: CT MAXILLOFACIAL WITH CONTRAST  TECHNIQUE: Multidetector CT imaging of the maxillofacial structures was performed with intravenous contrast. Multiplanar CT image reconstructions were also generated. A small metallic BB was placed on the right temple in order to reliably differentiate right from left.  CONTRAST:  61mL OMNIPAQUE IOHEXOL 300 MG/ML  SOLN  COMPARISON:  None.  FINDINGS: No acute fracture is identified. There are changes consistent with dental caries within many of the teeth particularly the maxillary molars bilaterally. Significant bony loss is noted surrounding the medial aspect first maxillary molar on the left. Bony irregularity is noted along the posterior aspect of the maxilla on the left which may be related to the prior infection. The possibility of osteomyelitis in this region would deserve consideration although no focal abscess or areas of abnormal enhancement are identified.  The orbits and their contents are within normal limits. The paranasal sinuses appear within normal limits. No other focal soft tissue abnormality is noted.  IMPRESSION: Multiple dental caries.  Areas of bony loss related to the dental disease bilaterally but worst on the left in the maxilla particularly along the medial  aspect of the first maxillary molar.  Area of mild irregular bony loss in the posterior aspect of the maxilla on the left. This is best seen on image number 33 of series 205 (coronal bony windows). This is likely related to the recent infection although the possibility of active osteomyelitis could not be totally excluded. No surrounding soft tissue abscess is seen.   Electronically Signed   By: Inez Catalina M.D.   On: 01/04/2015 18:11   Dg Chest Port 1 View  01/06/2015   CLINICAL DATA:  64 year old male with fever of unknown origin. Initial encounter.  EXAM: PORTABLE CHEST - 1 VIEW  COMPARISON:  Chest CTA 01/04/2015 and earlier.  FINDINGS: Portable AP upright view at 0701 hrs. Lower lung volumes. Stable cardiac size and mediastinal contours. No pneumothorax, pulmonary edema, pleural effusion or confluent pulmonary opacity. Stable visualized osseous structures.  IMPRESSION: Lower lung volumes, no acute cardiopulmonary abnormality.   Electronically Signed   By: Genevie Ann M.D.   On: 01/06/2015 08:09    Microbiology: Recent Results (from the past 240 hour(s))  Culture, blood (single)     Status: None (Preliminary result)   Collection Time: 01/04/15  4:15 AM  Result Value Ref Range Status   Specimen Description BLOOD RIGHT HAND  Final   Special Requests BOTTLES DRAWN AEROBIC ONLY 5CC  Final   Culture   Final           BLOOD CULTURE RECEIVED NO GROWTH TO DATE CULTURE WILL BE HELD FOR 5 DAYS BEFORE ISSUING A FINAL NEGATIVE REPORT Performed at [facility]    Report Status PENDING  Incomplete  Culture, blood (x 2)     Status: None (Preliminary result)   Collection Time: 01/04/15  4:18 AM  Result Value Ref Range Status   Specimen Description BLOOD LEFT WRIST  Final   Special Requests BOTTLES DRAWN AEROBIC AND ANAEROBIC 4CC EACH  Final   Culture   Final           BLOOD CULTURE RECEIVED NO GROWTH TO DATE CULTURE WILL BE HELD FOR 5 DAYS BEFORE ISSUING A FINAL NEGATIVE REPORT Performed at [facility]    Report Status PENDING  Incomplete  Culture, blood (x 2)     Status: None (Preliminary result)   Collection Time: 01/04/15  4:28 AM  Result Value Ref Range Status   Specimen Description BLOOD RIGHT ANTECUBITAL  Final   Special Requests BOTTLES DRAWN AEROBIC AND ANAEROBIC [place] EACH  Final   Culture   Final           BLOOD CULTURE RECEIVED NO GROWTH TO DATE CULTURE WILL BE HELD FOR 5 DAYS BEFORE ISSUING A FINAL NEGATIVE REPORT Performed  at [facility]    Report Status PENDING  Incomplete  MRSA PCR Screening     Status: None   Collection Time: 01/04/15  6:40 AM  Result Value Ref Range Status   MRSA by PCR NEGATIVE NEGATIVE Final    Comment:        The GeneXpert MRSA Assay (FDA approved for NASAL specimens only), is one component of a comprehensive MRSA colonization surveillance program. It is not intended to diagnose MRSA infection nor to guide or monitor treatment for MRSA infections.   Culture, Urine     Status: None   Collection Time: 01/04/15  3:30 PM  Result Value Ref Range Status   Specimen Description URINE, CLEAN CATCH  Final   Special Requests NONE  Final   Colony Count   Final    6,000 COLONIES/ML Performed at [facility]    Culture   Final    INSIGNIFICANT GROWTH Performed at [facility]    Report Status 01/05/2015 FINAL  Final  Respiratory virus panel     Status: None   Collection Time: 01/04/15  3:34 PM  Result Value Ref Range Status   Respiratory Syncytial Virus A Negative Negative Final   Respiratory Syncytial Virus B Negative Negative Final   Influenza A Negative Negative Final   Influenza B Negative Negative Final   Parainfluenza 1 Negative Negative Final   Parainfluenza 2 Negative Negative Final   Parainfluenza 3 Negative Negative Final   Metapneumovirus Negative Negative Final   Rhinovirus Negative Negative Final   Adenovirus Negative Negative Final    Comment: (NOTE) Performed At: [facility] [place], [place] [id number] Lindon Romp MD JJ:[id number]      Labs: Basic Metabolic Panel:  Recent Labs Lab 01/03/15 2212 01/05/15 0300 01/06/15 0540 01/07/15 0450  NA 133* 135 135 139  K 3.4* 3.9 4.4 3.7  CL 97* 105 106 107  CO2 21* 23 19* 23  GLUCOSE 180* 234* 277* 147*  BUN 12 12 15 17   CREATININE 1.29* 1.40* 1.30* 1.36*  CALCIUM 9.5 8.0* 8.2* 8.9  MG  --  1.7  --   --    Liver Function Tests:  Recent Labs Lab 01/05/15 0300 01/06/15 0540  AST 21 20  ALT 17 21  ALKPHOS 47 54  BILITOT 0.8 0.7  PROT 6.3* 6.6  ALBUMIN 2.5* 2.5*   No results for input(s): LIPASE, AMYLASE in the last 168 hours. No results for input(s): AMMONIA in the last 168 hours. CBC:  Recent Labs Lab 01/03/15 2212 01/04/15 0806 01/05/15 0300 01/06/15 0540 01/07/15 0450  WBC 13.0* 7.3 11.0* 9.9 9.7  NEUTROABS 11.5*  --  10.0*  --   --   HGB 13.8 13.0 11.9* 12.0* 12.2*  HCT 40.4 38.3* 34.7* 35.7* 36.5*  MCV 91.2 91.6 91.3 91.1 90.6  PLT 183 121* 150 156 196   Cardiac Enzymes:  Recent Labs Lab 01/03/15 2212 01/04/15 0319 01/04/15 0805 01/04/15 1607 01/06/15 0540  TROPONINI 0.09* <0.03 <0.03 0.06* <0.03   BNP: BNP (last 3 results) No results for input(s): BNP in the last 8760 hours.  ProBNP (last 3 results) No results for input(s): PROBNP in the last 8760 hours.  CBG:  Recent Labs Lab 01/06/15 1739 01/06/15 2228 01/07/15 0753  GLUCAP 106* 119* 143*       Signed:  Mollee Neer  [facility] 01/07/2015, 9:08 AM

## 2015-01-07 NOTE — Progress Notes (Signed)
Glenard Haring discharged Home per MD order.  Discharge instructions reviewed and discussed with the patient, all questions and concerns answered. Copy of instructions, care notes for new diagnosis & medications and scripts given to patient.    Medication List    STOP taking these medications        HYDROcodone-acetaminophen 5-325 MG per tablet  Commonly known as:  NORCO/VICODIN     ibuprofen 800 MG tablet  Commonly known as:  ADVIL,MOTRIN     lisinopril-hydrochlorothiazide 20-25 MG per tablet  Commonly known as:  PRINZIDE,ZESTORETIC      TAKE these medications        acetaminophen 325 MG tablet  Commonly known as:  TYLENOL  Take 975 mg by mouth every 6 (six) hours as needed.     albuterol 108 (90 BASE) MCG/ACT inhaler  Commonly known as:  PROVENTIL HFA;VENTOLIN HFA  Inhale 2 puffs into the lungs every 6 (six) hours as needed for wheezing or shortness of breath.     benzonatate 100 MG capsule  Commonly known as:  TESSALON  Take 1 capsule (100 mg total) by mouth 3 (three) times daily as needed for cough.     levofloxacin 750 MG tablet  Commonly known as:  LEVAQUIN  Take 1 tablet (750 mg total) by mouth daily.     metoprolol tartrate 25 MG tablet  Commonly known as:  LOPRESSOR  Take 0.5 tablets (12.5 mg total) by mouth 2 (two) times daily.        Patients skin is clean, dry and intact, no evidence of skin break down. IV site discontinued and catheter remains intact. Site without signs and symptoms of complications. Dressing and pressure applied.  Patient escorted to car by NT in a wheelchair,  no distress noted upon discharge.  Tomes, Jerel Sardina C 01/07/2015 3:36 PM

## 2015-01-10 LAB — CULTURE, BLOOD (ROUTINE X 2)
Culture: NO GROWTH
Culture: NO GROWTH

## 2015-01-10 LAB — CULTURE, BLOOD (SINGLE): CULTURE: NO GROWTH

## 2015-01-12 ENCOUNTER — Ambulatory Visit: Payer: Self-pay | Attending: Family Medicine | Admitting: Family Medicine

## 2015-01-12 ENCOUNTER — Encounter: Payer: Self-pay | Admitting: Family Medicine

## 2015-01-12 VITALS — BP 146/94 | HR 83 | Temp 97.5°F | Resp 18 | Ht 73.0 in | Wt 208.0 lb

## 2015-01-12 DIAGNOSIS — E119 Type 2 diabetes mellitus without complications: Secondary | ICD-10-CM | POA: Insufficient documentation

## 2015-01-12 DIAGNOSIS — I10 Essential (primary) hypertension: Secondary | ICD-10-CM | POA: Insufficient documentation

## 2015-01-12 DIAGNOSIS — E1129 Type 2 diabetes mellitus with other diabetic kidney complication: Secondary | ICD-10-CM | POA: Insufficient documentation

## 2015-01-12 DIAGNOSIS — J189 Pneumonia, unspecified organism: Secondary | ICD-10-CM | POA: Insufficient documentation

## 2015-01-12 DIAGNOSIS — A419 Sepsis, unspecified organism: Secondary | ICD-10-CM | POA: Insufficient documentation

## 2015-01-12 MED ORDER — METOPROLOL TARTRATE 25 MG PO TABS: 25.0000 mg | ORAL_TABLET | Freq: Two times a day (BID) | ORAL | Status: DC

## 2015-01-12 MED ORDER — GLUCOSE BLOOD VI STRP: ORAL_STRIP | Status: DC

## 2015-01-12 MED ORDER — LANCET DEVICE MISC: 1.0000 | Freq: Once | Status: DC

## 2015-01-12 MED ORDER — LISINOPRIL 5 MG PO TABS: 5.0000 mg | ORAL_TABLET | Freq: Every day | ORAL | Status: DC

## 2015-01-12 MED ORDER — TRUERESULT BLOOD GLUCOSE W/DEVICE KIT: 1.0000 | PACK | Freq: Once | Status: DC

## 2015-01-12 NOTE — Progress Notes (Signed)
Patient was hospitalized last week for viral infection. Patient reports feeling "100% better". Patient denies pain at this time. Patient has been taking metoprolol 25mg , 1 tab BID, instructions on bottle say 1/2 tab BID.
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
## 2015-01-12 NOTE — Patient Instructions (Signed)
Diabetes Mellitus and Food It is important for you to manage your blood sugar (glucose) level. Your blood glucose level can be greatly affected by what you eat. Eating healthier foods in the appropriate amounts throughout the day at about the same time each day will help you control your blood glucose level. It can also help slow or prevent worsening of your diabetes mellitus. Healthy eating may even help you improve the level of your blood pressure and reach or maintain a healthy weight.  HOW CAN FOOD AFFECT ME? Carbohydrates Carbohydrates affect your blood glucose level more than any other type of food. Your dietitian will help you determine how many carbohydrates to eat at each meal and teach you how to count carbohydrates. Counting carbohydrates is important to keep your blood glucose at a healthy level, especially if you are using insulin or taking certain medicines for diabetes mellitus. Alcohol Alcohol can cause sudden decreases in blood glucose (hypoglycemia), especially if you use insulin or take certain medicines for diabetes mellitus. Hypoglycemia can be a life-threatening condition. Symptoms of hypoglycemia (sleepiness, dizziness, and disorientation) are similar to symptoms of having too much alcohol.  If your health care provider has given you approval to drink alcohol, do so in moderation and use the following guidelines:  Women should not have more than one drink per day, and men should not have more than two drinks per day. One drink is equal to:  12 oz of beer.  5 oz of wine.  1 oz of hard liquor.  Do not drink on an empty stomach.  Keep yourself hydrated. Have water, diet soda, or unsweetened iced tea.  Regular soda, juice, and other mixers might contain a lot of carbohydrates and should be counted. WHAT FOODS ARE NOT RECOMMENDED? As you make food choices, it is important to remember that all foods are not the same. Some foods have fewer nutrients per serving than other  foods, even though they might have the same number of calories or carbohydrates. It is difficult to get your body what it needs when you eat foods with fewer nutrients. Examples of foods that you should avoid that are high in calories and carbohydrates but low in nutrients include:  Trans fats (most processed foods list trans fats on the Nutrition Facts label).  Regular soda.  Juice.  Candy.  Sweets, such as cake, pie, doughnuts, and cookies.  Fried foods. WHAT FOODS CAN I EAT? Have nutrient-rich foods, which will nourish your body and keep you healthy. The food you should eat also will depend on several factors, including:  The calories you need.  The medicines you take.  Your weight.  Your blood glucose level.  Your blood pressure level.  Your cholesterol level. You also should eat a variety of foods, including:  Protein, such as meat, poultry, fish, tofu, nuts, and seeds (lean animal proteins are best).  Fruits.  Vegetables.  Dairy products, such as milk, cheese, and yogurt (low fat is best).  Breads, grains, pasta, cereal, rice, and beans.  Fats such as olive oil, trans fat-free margarine, canola oil, avocado, and olives. DOES EVERYONE WITH DIABETES MELLITUS HAVE THE SAME MEAL PLAN? Because every person with diabetes mellitus is different, there is not one meal plan that works for everyone. It is very important that you meet with a dietitian who will help you create a meal plan that is just right for you. Document Released: 05/12/2005 Document Revised: 08/20/2013 Document Reviewed: 07/12/2013 ExitCare Patient Information 2015 [employer]. This   information is not intended to replace advice given to you by your health care provider. Make sure you discuss any questions you have with your health care provider.  

## 2015-01-16 ENCOUNTER — Telehealth: Payer: Self-pay | Admitting: General Practice

## 2015-01-16 NOTE — Telephone Encounter (Signed)
Pt walked in wanting to learn how to use the diabetic machine.

## 2015-02-02 ENCOUNTER — Encounter: Payer: Self-pay | Admitting: Internal Medicine

## 2015-02-02 ENCOUNTER — Ambulatory Visit: Payer: Self-pay | Attending: Internal Medicine | Admitting: Internal Medicine

## 2015-02-02 VITALS — BP 130/78 | HR 75 | Temp 98.1°F | Resp 18 | Ht 73.0 in | Wt 210.0 lb

## 2015-02-02 DIAGNOSIS — R7309 Other abnormal glucose: Secondary | ICD-10-CM

## 2015-02-02 DIAGNOSIS — Z125 Encounter for screening for malignant neoplasm of prostate: Secondary | ICD-10-CM

## 2015-02-02 DIAGNOSIS — I10 Essential (primary) hypertension: Secondary | ICD-10-CM

## 2015-02-02 DIAGNOSIS — R7303 Prediabetes: Secondary | ICD-10-CM

## 2015-02-02 LAB — CBC
HCT: 39.4 % (ref 39.0–52.0)
Hemoglobin: 13.2 g/dL (ref 13.0–17.0)
MCH: 30.4 pg (ref 26.0–34.0)
MCHC: 33.5 g/dL (ref 30.0–36.0)
MCV: 90.8 fL (ref 78.0–100.0)
MPV: 10.6 fL (ref 8.6–12.4)
Platelets: 172 10*3/uL (ref 150–400)
RBC: 4.34 MIL/uL (ref 4.22–5.81)
RDW: 13.4 % (ref 11.5–15.5)
WBC: 6 10*3/uL (ref 4.0–10.5)

## 2015-02-02 LAB — COMPLETE METABOLIC PANEL WITH GFR
ALBUMIN: 3.6 g/dL (ref 3.5–5.2)
ALK PHOS: 47 U/L (ref 39–117)
ALT: 19 U/L (ref 0–53)
AST: 17 U/L (ref 0–37)
BILIRUBIN TOTAL: 0.4 mg/dL (ref 0.2–1.2)
BUN: 13 mg/dL (ref 6–23)
CALCIUM: 9.5 mg/dL (ref 8.4–10.5)
CO2: 26 meq/L (ref 19–32)
Chloride: 104 mEq/L (ref 96–112)
Creat: 1.11 mg/dL (ref 0.50–1.35)
GFR, Est African American: 81 mL/min
GFR, Est Non African American: 70 mL/min
Glucose, Bld: 101 mg/dL — ABNORMAL HIGH (ref 70–99)
Potassium: 3.9 mEq/L (ref 3.5–5.3)
SODIUM: 140 meq/L (ref 135–145)
Total Protein: 7.1 g/dL (ref 6.0–8.3)

## 2015-02-02 LAB — LIPID PANEL
CHOL/HDL RATIO: 4.7 ratio
Cholesterol: 150 mg/dL (ref 0–200)
HDL: 32 mg/dL — ABNORMAL LOW (ref >=40)
LDL CALC: 97 mg/dL (ref 0–99)
Triglycerides: 103 mg/dL (ref <150)
VLDL: 21 mg/dL (ref 0–40)

## 2015-02-02 LAB — GLUCOSE, POCT (MANUAL RESULT ENTRY): POC Glucose: 106 mg/dl — AB (ref 70–99)

## 2015-02-02 NOTE — Patient Instructions (Signed)
DASH Eating Plan °DASH stands for "Dietary Approaches to Stop Hypertension." The DASH eating plan is a healthy eating plan that has been shown to reduce high blood pressure (hypertension). Additional health benefits may include reducing the risk of type 2 diabetes mellitus, heart disease, and stroke. The DASH eating plan may also help with weight loss. °WHAT DO I NEED TO KNOW ABOUT THE DASH EATING PLAN? °For the DASH eating plan, you will follow these general guidelines: °· Choose foods with a percent daily value for sodium of less than 5% (as listed on the food label). °· Use salt-free seasonings or herbs instead of table salt or sea salt. °· Check with your health care provider or pharmacist before using salt substitutes. °· Eat lower-sodium products, often labeled as "lower sodium" or "no salt added." °· Eat fresh foods. °· Eat more vegetables, fruits, and low-fat dairy products. °· Choose whole grains. Look for the word "whole" as the first word in the ingredient list. °· Choose fish and skinless chicken or turkey more often than red meat. Limit fish, poultry, and meat to 6 oz (170 g) each day. °· Limit sweets, desserts, sugars, and sugary drinks. °· Choose heart-healthy fats. °· Limit cheese to 1 oz (28 g) per day. °· Eat more home-cooked food and less restaurant, buffet, and fast food. °· Limit fried foods. °· Cook foods using methods other than frying. °· Limit canned vegetables. If you do use them, rinse them well to decrease the sodium. °· When eating at a restaurant, ask that your food be prepared with less salt, or no salt if possible. °WHAT FOODS CAN I EAT? °Seek help from a dietitian for individual calorie needs. °Grains °Whole grain or whole wheat bread. Brown rice. Whole grain or whole wheat pasta. Quinoa, bulgur, and whole grain cereals. Low-sodium cereals. Corn or whole wheat flour tortillas. Whole grain cornbread. Whole grain crackers. Low-sodium crackers. °Vegetables °Fresh or frozen vegetables  (raw, steamed, roasted, or grilled). Low-sodium or reduced-sodium tomato and vegetable juices. Low-sodium or reduced-sodium tomato sauce and paste. Low-sodium or reduced-sodium canned vegetables.  °Fruits °All fresh, canned (in natural juice), or frozen fruits. °Meat and Other Protein Products °Ground beef (85% or leaner), grass-fed beef, or beef trimmed of fat. Skinless chicken or turkey. Ground chicken or turkey. Pork trimmed of fat. All fish and seafood. Eggs. Dried beans, peas, or lentils. Unsalted nuts and seeds. Unsalted canned beans. °Dairy °Low-fat dairy products, such as skim or 1% milk, 2% or reduced-fat cheeses, low-fat ricotta or cottage cheese, or plain low-fat yogurt. Low-sodium or reduced-sodium cheeses. °Fats and Oils °Tub margarines without trans fats. Light or reduced-fat mayonnaise and salad dressings (reduced sodium). Avocado. Safflower, olive, or canola oils. Natural peanut or almond butter. °Other °Unsalted popcorn and pretzels. °The items listed above may not be a complete list of recommended foods or beverages. Contact your dietitian for more options. °WHAT FOODS ARE NOT RECOMMENDED? °Grains °White bread. White pasta. White rice. Refined cornbread. Bagels and croissants. Crackers that contain trans fat. °Vegetables °Creamed or fried vegetables. Vegetables in a cheese sauce. Regular canned vegetables. Regular canned tomato sauce and paste. Regular tomato and vegetable juices. °Fruits °Dried fruits. Canned fruit in light or heavy syrup. Fruit juice. °Meat and Other Protein Products °Fatty cuts of meat. Ribs, chicken wings, bacon, sausage, bologna, salami, chitterlings, fatback, hot dogs, bratwurst, and packaged luncheon meats. Salted nuts and seeds. Canned beans with salt. °Dairy °Whole or 2% milk, cream, half-and-half, and cream cheese. Whole-fat or sweetened yogurt. Full-fat   cheeses or blue cheese. Nondairy creamers and whipped toppings. Processed cheese, cheese spreads, or cheese  curds. °Condiments °Onion and garlic salt, seasoned salt, table salt, and sea salt. Canned and packaged gravies. Worcestershire sauce. Tartar sauce. Barbecue sauce. Teriyaki sauce. Soy sauce, including reduced sodium. Steak sauce. Fish sauce. Oyster sauce. Cocktail sauce. Horseradish. Ketchup and mustard. Meat flavorings and tenderizers. Bouillon cubes. Hot sauce. Tabasco sauce. Marinades. Taco seasonings. Relishes. °Fats and Oils °Butter, stick margarine, lard, shortening, ghee, and bacon fat. Coconut, palm kernel, or palm oils. Regular salad dressings. °Other °Pickles and olives. Salted popcorn and pretzels. °The items listed above may not be a complete list of foods and beverages to avoid. Contact your dietitian for more information. °WHERE CAN I FIND MORE INFORMATION? °National Heart, Lung, and [employer] Institute: www.nhlbi.nih.gov/health/health-topics/topics/dash/ °Document Released: 08/04/2011 Document Revised: 12/30/2013 Document Reviewed: 06/19/2013 °ExitCare® Patient Information ©2015 [employer]. This information is not intended to replace advice given to you by your health care provider. Make sure you discuss any questions you have with your health care provider. ° °

## 2015-02-02 NOTE — Progress Notes (Signed)
Establish Care with PCP F/U HTN Pre DM Glucose at home between 81-126

## 2015-02-02 NOTE — Progress Notes (Signed)
Patient ID: Logan Knight, male   DOB: April 25, 1951, 64 y.o.   MRN: [id number]  CC: HTN   HPI: Logan Knight is a 64 y.o. male here today for a follow up visit.  Patient has past medical history of hypertension and prediabetes. He was seen by Dr. Jarold Song here last month and was placed on Lisinopril and increased Metoprolol to 25 mg BID . He reports that he takes his medication daily without any complications.  He reports taking his blood pressure medications this morning. He was placed on lifestyle changes for his prediabetes. He denies any family history of diabetes.   Patient has No headache, No chest pain, No abdominal pain - No Nausea, No new weakness tingling or numbness, No Cough - SOB.  No Known Allergies Past Medical History  Diagnosis Date  . Hypertension    Current Outpatient Prescriptions on File Prior to Visit  Medication Sig Dispense Refill  . Blood Glucose Monitoring Suppl (TRUERESULT BLOOD GLUCOSE) W/DEVICE KIT 1 each by Does not apply route once. 1 each 0  . glucose blood (TRUETEST TEST) test strip Use as instructed 100 each 12  . Lancet Device MISC 1 each by Does not apply route once. 100 each 5  . lisinopril (PRINIVIL,ZESTRIL) 5 MG tablet Take 1 tablet (5 mg total) by mouth daily. 90 tablet 3  . metoprolol tartrate (LOPRESSOR) 25 MG tablet Take 1 tablet (25 mg total) by mouth 2 (two) times daily. 60 tablet 2  . albuterol (PROVENTIL HFA;VENTOLIN HFA) 108 (90 BASE) MCG/ACT inhaler Inhale 2 puffs into the lungs every 6 (six) hours as needed for wheezing or shortness of breath. (Patient not taking: Reported on 02/02/2015) 1 Inhaler 2   No current facility-administered medications on file prior to visit.   Family History  Problem Relation Age of Onset  . Diabetes Mother    History   Social History  . Marital Status: Married    Spouse Name: N/A  . Number of Children: N/A  . Years of Education: N/A   Occupational History  . Not on file.   Social History Main Topics  .  Smoking status: Never Smoker   . Smokeless tobacco: Not on file  . Alcohol Use: Yes     Comment: 2 40 oz a week or 3-4 cans a weeks  . Drug Use: No  . Sexual Activity: Not on file   Other Topics Concern  . Not on file   Social History Narrative    Review of Systems: See HPI    Objective:   Filed Vitals:   02/02/15 0913  BP: 151/90  Pulse: 75  Temp: 98.1 F (36.7 C)  Resp: 18    Physical Exam  Constitutional: He is oriented to person, place, and time.  Cardiovascular: Normal rate, regular rhythm and normal heart sounds.   Pulmonary/Chest: Effort normal and breath sounds normal.  Neurological: He is alert and oriented to person, place, and time.  Skin: Skin is warm and dry.     Lab Results  Component Value Date   WBC 9.7 01/07/2015   HGB 12.2* 01/07/2015   HCT 36.5* 01/07/2015   MCV 90.6 01/07/2015   PLT 196 01/07/2015   Lab Results  Component Value Date   CREATININE 1.36* 01/07/2015   BUN 17 01/07/2015   NA 139 01/07/2015   K 3.7 01/07/2015   CL 107 01/07/2015   CO2 23 01/07/2015    Lab Results  Component Value Date   HGBA1C 6.6* 01/06/2015  Lipid Panel     Component Value Date/Time   CHOL 161 01/05/2010 2237   TRIG 244* 01/05/2010 2237   HDL 36* 01/05/2010 2237   CHOLHDL 4.5 Ratio 01/05/2010 2237   VLDL 49* 01/05/2010 2237   LDLCALC 76 01/05/2010 2237       Assessment and plan:   Tyri was seen today for establish care and hypertension.  Diagnoses and all orders for this visit:  Essential hypertension Patient blood pressure is stable and may continue on current medication.  Education on diet, exercise, and modifiable risk factors discussed. Will obtain appropriate labs as needed. Will follow up in 3-6 months.   Prediabetes Orders: -     POCT glucose (manual entry) -     COMPLETE METABOLIC PANEL WITH GFR -     Lipid panel -     CBC -     Microalbumin, urine Reinforced lifestyle changes in order to avoid medication need.   Prostate  cancer screening Orders: -     PSA  Return in about 3 months (around 05/05/2015) for prediabetes/HTN.       Chari Manning, NP-C [facility] [phone] 02/02/2015, 9:24 AM

## 2015-02-03 LAB — PSA: PSA: 4.84 ng/mL — AB (ref <=4.00)

## 2015-02-03 LAB — MICROALBUMIN, URINE: MICROALB UR: 3.6 mg/dL — AB (ref <2.0)

## 2015-02-05 ENCOUNTER — Ambulatory Visit: Payer: Self-pay | Attending: Internal Medicine

## 2015-02-23 ENCOUNTER — Telehealth: Payer: Self-pay

## 2015-02-23 NOTE — Telephone Encounter (Signed)
-----   Message from Lance Bosch, NP sent at 02/06/2015 11:12 AM EDT ----- Labs normal except his PSA is high. This is a lab test that we do to screen for prostate cancer. Due to this level I would like you to send a referral to Urology.  Also make sure he takes his lisinopril daily because he did have protein in his urine. This medication helps to protect kidneys and bring this level down

## 2015-02-23 NOTE — Telephone Encounter (Signed)
Nurse called patient, reached voicemail. Left message for patient to call Paola Flynt with [facility], at [phone].  

## 2015-02-24 NOTE — Telephone Encounter (Signed)
Nurse called patient, patient verified date of birth. Patient aware of normal labs except high PSA. Patient aware of PSA is lab test to screen for prostate cancer. Patient agrees to see urologist.  Patient takes lisinopril daily and is aware of medication protecting kidneys and will help bring down level of protein in urine.  Patient has medications with refills he needs to call into [facility]. Nurse transferred patient to pharmacy. Patient voices understanding and has no further questions at this time.

## 2015-02-24 NOTE — Telephone Encounter (Signed)
Nurse called patient, reached voicemail. Left message for patient to call Marcanthony Sleight with [facility], at [phone].  

## 2015-02-24 NOTE — Telephone Encounter (Signed)
Nurse called patient, reached voicemail. Left message for patient to call Sadako Cegielski with [facility], at [phone].  

## 2015-11-09 MED FILL — LISINOPRIL 5 MG TABLET: 5 | 30 days supply | Qty: 30 | Fill #5

## 2016-02-25 ENCOUNTER — Other Ambulatory Visit: Payer: Self-pay | Admitting: Family Medicine

## 2016-03-03 MED FILL — ?LISINOPRIL 5 MG TABLET: 5 | 30 days supply | Qty: 30 | Fill #0

## 2016-03-03 MED FILL — METOPROLOL TARTRATE 25 MG T: 25 | 30 days supply | Qty: 60 | Fill #0

## 2016-05-10 ENCOUNTER — Other Ambulatory Visit: Payer: Self-pay | Admitting: Internal Medicine

## 2016-06-09 ENCOUNTER — Encounter: Payer: Self-pay | Admitting: Family Medicine

## 2016-06-09 ENCOUNTER — Ambulatory Visit: Payer: Self-pay | Attending: Family Medicine | Admitting: Family Medicine

## 2016-06-09 VITALS — BP 160/94 | HR 92 | Temp 99.0°F | Ht 73.0 in | Wt 220.6 lb

## 2016-06-09 DIAGNOSIS — E1169 Type 2 diabetes mellitus with other specified complication: Secondary | ICD-10-CM

## 2016-06-09 DIAGNOSIS — H5461 Unqualified visual loss, right eye, normal vision left eye: Secondary | ICD-10-CM | POA: Insufficient documentation

## 2016-06-09 DIAGNOSIS — Z114 Encounter for screening for human immunodeficiency virus [HIV]: Secondary | ICD-10-CM | POA: Insufficient documentation

## 2016-06-09 DIAGNOSIS — X58XXXA Exposure to other specified factors, initial encounter: Secondary | ICD-10-CM | POA: Insufficient documentation

## 2016-06-09 DIAGNOSIS — S0591XS Unspecified injury of right eye and orbit, sequela: Secondary | ICD-10-CM

## 2016-06-09 DIAGNOSIS — W3400XS Accidental discharge from unspecified firearms or gun, sequela: Secondary | ICD-10-CM

## 2016-06-09 DIAGNOSIS — I10 Essential (primary) hypertension: Secondary | ICD-10-CM | POA: Insufficient documentation

## 2016-06-09 DIAGNOSIS — B353 Tinea pedis: Secondary | ICD-10-CM | POA: Insufficient documentation

## 2016-06-09 DIAGNOSIS — B351 Tinea unguium: Secondary | ICD-10-CM | POA: Insufficient documentation

## 2016-06-09 DIAGNOSIS — E559 Vitamin D deficiency, unspecified: Secondary | ICD-10-CM | POA: Insufficient documentation

## 2016-06-09 DIAGNOSIS — S31139S Puncture wound of abdominal wall without foreign body, unspecified quadrant without penetration into peritoneal cavity, sequela: Secondary | ICD-10-CM | POA: Insufficient documentation

## 2016-06-09 DIAGNOSIS — E785 Hyperlipidemia, unspecified: Secondary | ICD-10-CM

## 2016-06-09 DIAGNOSIS — E119 Type 2 diabetes mellitus without complications: Secondary | ICD-10-CM | POA: Insufficient documentation

## 2016-06-09 DIAGNOSIS — Z760 Encounter for issue of repeat prescription: Secondary | ICD-10-CM | POA: Insufficient documentation

## 2016-06-09 DIAGNOSIS — S31109S Unspecified open wound of abdominal wall, unspecified quadrant without penetration into peritoneal cavity, sequela: Secondary | ICD-10-CM

## 2016-06-09 DIAGNOSIS — Z87828 Personal history of other (healed) physical injury and trauma: Secondary | ICD-10-CM | POA: Insufficient documentation

## 2016-06-09 LAB — COMPLETE METABOLIC PANEL WITH GFR
ALT: 22 U/L (ref 9–46)
AST: 42 U/L — AB (ref 10–35)
Albumin: 4 g/dL (ref 3.6–5.1)
Alkaline Phosphatase: 61 U/L (ref 40–115)
BUN: 16 mg/dL (ref 7–25)
CALCIUM: 9.2 mg/dL (ref 8.6–10.3)
CHLORIDE: 103 mmol/L (ref 98–110)
CO2: 24 mmol/L (ref 20–31)
CREATININE: 1.29 mg/dL — AB (ref 0.70–1.25)
GFR, Est African American: 67 mL/min (ref >=60)
GFR, Est Non African American: 58 mL/min — ABNORMAL LOW (ref >=60)
GLUCOSE: 129 mg/dL — AB (ref 65–99)
POTASSIUM: 3.7 mmol/L (ref 3.5–5.3)
SODIUM: 139 mmol/L (ref 135–146)
Total Bilirubin: 0.5 mg/dL (ref 0.2–1.2)
Total Protein: 7.2 g/dL (ref 6.1–8.1)

## 2016-06-09 LAB — LIPID PANEL
CHOL/HDL RATIO: 7 ratio — AB (ref <=5.0)
CHOLESTEROL: 175 mg/dL (ref 125–200)
HDL: 25 mg/dL — ABNORMAL LOW (ref >=40)
Triglycerides: 758 mg/dL — ABNORMAL HIGH (ref <150)

## 2016-06-09 LAB — GLUCOSE, POCT (MANUAL RESULT ENTRY): POC GLUCOSE: 161 mg/dL — AB (ref 70–99)

## 2016-06-09 LAB — POCT GLYCOSYLATED HEMOGLOBIN (HGB A1C): Hemoglobin A1C: 7.9

## 2016-06-09 MED ORDER — AMLODIPINE BESYLATE 5 MG PO TABS: 5.0000 mg | ORAL_TABLET | Freq: Every day | ORAL | 5 refills | Status: DC

## 2016-06-09 MED ORDER — TERBINAFINE HCL 250 MG PO TABS: 250.0000 mg | ORAL_TABLET | Freq: Every day | ORAL | 2 refills | Status: DC

## 2016-06-09 MED ORDER — LISINOPRIL 5 MG PO TABS: 5.0000 mg | ORAL_TABLET | Freq: Every day | ORAL | 5 refills | Status: DC

## 2016-06-09 MED FILL — AMLODIPINE BESYLATE 5 MG TA: 5 | 30 days supply | Qty: 30 | Fill #0

## 2016-06-09 MED FILL — LISINOPRIL 5 MG TABLET: 5 | 30 days supply | Qty: 30 | Fill #0

## 2016-06-09 MED FILL — TERBINAFINE HCL 250 MG TAB: 250 | 30 days supply | Qty: 30 | Fill #0
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
## 2016-06-09 NOTE — Progress Notes (Signed)
Refills on lisinopril, and metoprolol.  Pt is getting flu shot today.
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
## 2016-06-10 LAB — HIV ANTIBODY (ROUTINE TESTING W REFLEX): HIV 1&2 Ab, 4th Generation: NONREACTIVE

## 2016-06-10 LAB — VITAMIN D 25 HYDROXY (VIT D DEFICIENCY, FRACTURES): Vit D, 25-Hydroxy: 13 ng/mL — ABNORMAL LOW (ref 30–100)

## 2016-06-14 DIAGNOSIS — B351 Tinea unguium: Secondary | ICD-10-CM | POA: Insufficient documentation

## 2016-06-14 DIAGNOSIS — B353 Tinea pedis: Secondary | ICD-10-CM | POA: Insufficient documentation

## 2016-06-14 DIAGNOSIS — S0591XA Unspecified injury of right eye and orbit, initial encounter: Secondary | ICD-10-CM | POA: Insufficient documentation

## 2016-06-14 MED ORDER — ATORVASTATIN CALCIUM 40 MG PO TABS: 40.0000 mg | ORAL_TABLET | Freq: Every day | ORAL | 3 refills | Status: DC

## 2016-06-14 MED ORDER — VITAMIN D (ERGOCALCIFEROL) 1.25 MG (50000 UNIT) PO CAPS: 50000.0000 [IU] | ORAL_CAPSULE | ORAL | 0 refills | Status: DC

## 2016-06-14 NOTE — Assessment & Plan Note (Signed)
Still with vit D deficiency Vit D ordered

## 2016-06-14 NOTE — Assessment & Plan Note (Signed)
A: elevated Med: compliant P: Change metoprolol to norvasc 5 mg  Refilled lisinopril 5 mg

## 2016-06-14 NOTE — Assessment & Plan Note (Signed)
Remains controlled but A1c elevated Advised decreased sugar in diet  Patient agrees to this

## 2016-06-14 NOTE — Assessment & Plan Note (Signed)
Persistent in the setting of diabetes lipitor 40 mg daily ordered

## 2016-06-16 ENCOUNTER — Telehealth: Payer: Self-pay

## 2016-06-16 NOTE — Telephone Encounter (Signed)
Pt was called on 10/19 to go over lab results. Pt did not answer the phone a VM was left informing pt to return call to go over lab results.

## 2016-07-28 ENCOUNTER — Encounter: Payer: Self-pay | Admitting: Family Medicine

## 2016-07-28 ENCOUNTER — Ambulatory Visit: Payer: Medicare Other | Attending: Family Medicine | Admitting: Family Medicine

## 2016-07-28 VITALS — BP 153/77 | HR 88 | Temp 98.0°F | Ht 73.0 in | Wt 222.6 lb

## 2016-07-28 DIAGNOSIS — Z79899 Other long term (current) drug therapy: Secondary | ICD-10-CM | POA: Insufficient documentation

## 2016-07-28 DIAGNOSIS — N529 Male erectile dysfunction, unspecified: Secondary | ICD-10-CM | POA: Insufficient documentation

## 2016-07-28 DIAGNOSIS — I10 Essential (primary) hypertension: Secondary | ICD-10-CM | POA: Insufficient documentation

## 2016-07-28 DIAGNOSIS — E785 Hyperlipidemia, unspecified: Secondary | ICD-10-CM | POA: Insufficient documentation

## 2016-07-28 DIAGNOSIS — E119 Type 2 diabetes mellitus without complications: Secondary | ICD-10-CM | POA: Insufficient documentation

## 2016-07-28 DIAGNOSIS — E1169 Type 2 diabetes mellitus with other specified complication: Secondary | ICD-10-CM

## 2016-07-28 LAB — LIPID PANEL
CHOL/HDL RATIO: 5.9 ratio — AB (ref <5.0)
CHOLESTEROL: 158 mg/dL (ref <200)
HDL: 27 mg/dL — AB (ref >40)
TRIGLYCERIDES: 492 mg/dL — AB (ref <150)

## 2016-07-28 LAB — GLUCOSE, POCT (MANUAL RESULT ENTRY): POC Glucose: 236 mg/dl — AB (ref 70–99)

## 2016-07-28 MED ORDER — AMLODIPINE BESYLATE 10 MG PO TABS: 10.0000 mg | ORAL_TABLET | Freq: Every day | ORAL | 11 refills | Status: DC

## 2016-07-28 MED ORDER — LISINOPRIL 10 MG PO TABS: 5.0000 mg | ORAL_TABLET | Freq: Every day | ORAL | 2 refills | Status: DC

## 2016-07-28 MED ORDER — LISINOPRIL 10 MG PO TABS: 10.0000 mg | ORAL_TABLET | Freq: Every day | ORAL | 2 refills | Status: DC

## 2016-07-28 MED ORDER — METFORMIN HCL ER 500 MG PO TB24: 1000.0000 mg | ORAL_TABLET | Freq: Every day | ORAL | 5 refills | Status: DC

## 2016-07-28 MED ORDER — SILDENAFIL CITRATE 100 MG PO TABS: 50.0000 mg | ORAL_TABLET | Freq: Every day | ORAL | 2 refills | Status: DC | PRN

## 2016-07-28 MED ORDER — ASPIRIN EC 81 MG PO TBEC: 81.0000 mg | DELAYED_RELEASE_TABLET | Freq: Every day | ORAL | 11 refills | Status: DC

## 2016-07-28 MED FILL — VIT D2 1.25 MG (50,000 UNIT: 1.25 MG | 84 days supply | Qty: 12 | Fill #0

## 2016-07-28 MED FILL — METFORMIN HCL ER 500 MG TAB: 500 | 30 days supply | Qty: 60 | Fill #0

## 2016-07-28 MED FILL — AMLODIPINE BESYLATE 10 MG T: 10 | 30 days supply | Qty: 30 | Fill #0

## 2016-07-28 MED FILL — LISINOPRIL 10 MG TABLET: 10 | 30 days supply | Qty: 15 | Fill #0

## 2016-07-28 MED FILL — ATORVASTATIN 40 MG TABLET: 40 | 30 days supply | Qty: 30 | Fill #0

## 2016-07-28 NOTE — Progress Notes (Signed)
Subjective:  Patient ID: Logan Knight, male    DOB: Sep 22, 1950  Age: 65 y.o. MRN: [id number]  CC: Hypertension   HPI Logan Knight presents for   1. CHRONIC HYPERTENSION  Disease Monitoring  Blood pressure range: not checking   Chest pain: no   Dyspnea: no   Claudication: no   Medication compliance: yes  Medication Side Effects  Lightheadedness: no   Urinary frequency: no   Edema: no   Impotence: yes, for past year    2. Diabetes: no medications. Eating low sugar. Cut out juice. Has not started statin.   Social History  Substance Use Topics  . Smoking status: Never Smoker  . Smokeless tobacco: Not on file  . Alcohol use Yes     Comment: 2 40 oz a week or 3-4 cans a weeks    Outpatient Medications Prior to Visit  Medication Sig Dispense Refill  . amLODipine (NORVASC) 5 MG tablet Take 1 tablet (5 mg total) by mouth daily. 30 tablet 5  . atorvastatin (LIPITOR) 40 MG tablet Take 1 tablet (40 mg total) by mouth daily. 90 tablet 3  . Blood Glucose Monitoring Suppl (TRUERESULT BLOOD GLUCOSE) W/DEVICE KIT 1 each by Does not apply route once. 1 each 0  . glucose blood (TRUETEST TEST) test strip Use as instructed 100 each 12  . Lancet Device MISC 1 each by Does not apply route once. 100 each 5  . lisinopril (PRINIVIL,ZESTRIL) 5 MG tablet Take 1 tablet (5 mg total) by mouth daily. 30 tablet 5  . terbinafine (LAMISIL) 250 MG tablet Take 1 tablet (250 mg total) by mouth daily. 30 tablet 2  . Vitamin D, Ergocalciferol, (DRISDOL) 50000 units CAPS capsule Take 1 capsule (50,000 Units total) by mouth every 7 (seven) days. 12 capsule 0   No facility-administered medications prior to visit.     ROS Review of Systems  Constitutional: Negative for chills, fatigue, fever and unexpected weight change.  Eyes: Negative for visual disturbance.  Respiratory: Negative for cough and shortness of breath.   Cardiovascular: Negative for chest pain, palpitations and leg swelling.    Gastrointestinal: Negative for abdominal pain, blood in stool, constipation, diarrhea, nausea and vomiting.  Endocrine: Negative for polydipsia, polyphagia and polyuria.  Musculoskeletal: Negative for arthralgias, back pain, gait problem, myalgias and neck pain.  Skin: Negative for rash.  Allergic/Immunologic: Negative for immunocompromised state.  Hematological: Negative for adenopathy. Does not bruise/bleed easily.  Psychiatric/Behavioral: Negative for dysphoric mood, sleep disturbance and suicidal ideas. The patient is not nervous/anxious.     Objective:  BP (!) 153/77 (BP Location: Right Arm, Patient Position: Sitting, Cuff Size: Small)   Pulse 88   Temp 98 F (36.7 C) (Oral)   Ht '6\' 1"'$  (1.854 m)   Wt 222 lb 9.6 oz (101 kg)   SpO2 97%   BMI 29.37 kg/m   BP/Weight 07/28/2016 97/35/3299 10/02/2681  Systolic BP 419 622 297  Diastolic BP 77 94 78  Wt. (Lbs) 222.6 220.6 210  BMI 29.37 29.1 27.71   Physical Exam  Constitutional: He appears well-developed and well-nourished. No distress.  HENT:  Head: Normocephalic and atraumatic.  Neck: Normal range of motion. Neck supple.  Cardiovascular: Normal rate, regular rhythm, normal heart sounds and intact distal pulses.   Pulmonary/Chest: Effort normal and breath sounds normal.  Musculoskeletal: He exhibits no edema.  Neurological: He is alert.  Skin: Skin is warm and dry. No rash noted. No erythema.  Psychiatric: He has a normal  mood and affect.   Lab Results  Component Value Date   HGBA1C 7.9 06/09/2016   CBG 236  Assessment & Plan:   Logan Knight was seen today for hypertension.  Diagnoses and all orders for this visit:  Controlled type 2 diabetes mellitus without complication, without long-term current use of insulin (HCC) -     POCT glucose (manual entry) -     Discontinue: lisinopril (PRINIVIL,ZESTRIL) 10 MG tablet; Take 0.5 tablets (5 mg total) by mouth daily. -     metFORMIN (GLUCOPHAGE XR) 500 MG 24 hr tablet; Take 2  tablets (1,000 mg total) by mouth daily with breakfast. -     lisinopril (PRINIVIL,ZESTRIL) 10 MG tablet; Take 1 tablet (10 mg total) by mouth daily. -     aspirin EC 81 MG tablet; Take 1 tablet (81 mg total) by mouth daily.  Essential hypertension -     amLODipine (NORVASC) 10 MG tablet; Take 1 tablet (10 mg total) by mouth daily. -     Discontinue: lisinopril (PRINIVIL,ZESTRIL) 10 MG tablet; Take 0.5 tablets (5 mg total) by mouth daily. -     lisinopril (PRINIVIL,ZESTRIL) 10 MG tablet; Take 1 tablet (10 mg total) by mouth daily. -     aspirin EC 81 MG tablet; Take 1 tablet (81 mg total) by mouth daily.  Erectile dysfunction, unspecified erectile dysfunction type -     sildenafil (VIAGRA) 100 MG tablet; Take 0.5-1 tablets (50-100 mg total) by mouth daily as needed for erectile dysfunction. For PASS  Hyperlipidemia associated with type 2 diabetes mellitus (HCC) -     Lipid Panel -     aspirin EC 81 MG tablet; Take 1 tablet (81 mg total) by mouth daily.   No orders of the defined types were placed in this encounter.   Follow-up: Return in about 2 months (around 09/27/2016) for HTN and diabetes .   Boykin Nearing MD

## 2016-07-28 NOTE — Patient Instructions (Addendum)
Claudius was seen today for hypertension.  Diagnoses and all orders for this visit:  Controlled type 2 diabetes mellitus without complication, without long-term current use of insulin (HCC) -     POCT glucose (manual entry) -     Discontinue: lisinopril (PRINIVIL,ZESTRIL) 10 MG tablet; Take 0.5 tablets (5 mg total) by mouth daily. -     metFORMIN (GLUCOPHAGE XR) 500 MG 24 hr tablet; Take 2 tablets (1,000 mg total) by mouth daily with breakfast. -     lisinopril (PRINIVIL,ZESTRIL) 10 MG tablet; Take 1 tablet (10 mg total) by mouth daily. -     aspirin EC 81 MG tablet; Take 1 tablet (81 mg total) by mouth daily.  Essential hypertension -     amLODipine (NORVASC) 10 MG tablet; Take 1 tablet (10 mg total) by mouth daily. -     Discontinue: lisinopril (PRINIVIL,ZESTRIL) 10 MG tablet; Take 0.5 tablets (5 mg total) by mouth daily. -     lisinopril (PRINIVIL,ZESTRIL) 10 MG tablet; Take 1 tablet (10 mg total) by mouth daily. -     aspirin EC 81 MG tablet; Take 1 tablet (81 mg total) by mouth daily.  Erectile dysfunction, unspecified erectile dysfunction type -     sildenafil (VIAGRA) 100 MG tablet; Take 0.5-1 tablets (50-100 mg total) by mouth daily as needed for erectile dysfunction. For PASS  Hyperlipidemia associated with type 2 diabetes mellitus (HCC) -     Lipid Panel -     aspirin EC 81 MG tablet; Take 1 tablet (81 mg total) by mouth daily.   For first week take 1 metformin with breakfast  F/u in 2 months for hypertension and diabetes   Dr. Adrian Blackwater

## 2016-07-28 NOTE — Progress Notes (Signed)
Pt is here today to follow up on HTN, and toenail fungus.

## 2016-07-29 ENCOUNTER — Telehealth: Payer: Self-pay

## 2016-07-29 DIAGNOSIS — N529 Male erectile dysfunction, unspecified: Secondary | ICD-10-CM | POA: Insufficient documentation

## 2016-07-29 NOTE — Assessment & Plan Note (Signed)
Add metformin Goal A1c < 7 Start lipitor and ASA for primary prevention of CAD

## 2016-07-29 NOTE — Assessment & Plan Note (Signed)
Improved but still elevated Increase norvasc to 10 mg daily Increase lisinopril to 10 mg daily

## 2016-07-29 NOTE — Assessment & Plan Note (Signed)
ED in patient with HTN and diabetes  Control BP Control CBGs Add Viagra

## 2016-07-29 NOTE — Telephone Encounter (Signed)
Pt was called and no VM was set up to leave a message. 

## 2016-08-01 ENCOUNTER — Telehealth: Payer: Self-pay

## 2016-08-01 NOTE — Telephone Encounter (Signed)
Pt was called and and no answer. Pt results will be mailed out on 12/4.

## 2016-08-03 ENCOUNTER — Telehealth: Payer: Self-pay | Admitting: Family Medicine

## 2016-08-03 NOTE — Telephone Encounter (Signed)
Patient called the office to speak with PCP regarding his medication. Please follow up.  Thank you

## 2016-08-04 NOTE — Telephone Encounter (Signed)
Pt was called to obtain more information about the medication he is having problems with, but there was no answer or no VM set up to leave message.

## 2016-09-12 MED FILL — LISINOPRIL 10 MG TABLET: 10 | 30 days supply | Qty: 15 | Fill #1

## 2016-09-30 MED FILL — METFORMIN HCL ER 500 MG TAB: 500 | 30 days supply | Qty: 60 | Fill #1

## 2016-09-30 MED FILL — AMLODIPINE BESYLATE 10 MG T: 10 | 30 days supply | Qty: 30 | Fill #1

## 2016-09-30 MED FILL — ATORVASTATIN 40 MG TABLET: 40 | 30 days supply | Qty: 30 | Fill #1

## 2016-12-05 MED FILL — LISINOPRIL 10 MG TABLET: 10 | 30 days supply | Qty: 15 | Fill #2

## 2016-12-05 MED FILL — ATORVASTATIN 40 MG TABLET: 40 | 30 days supply | Qty: 30 | Fill #2

## 2016-12-05 MED FILL — AMLODIPINE BESYLATE 10 MG T: 10 | 30 days supply | Qty: 30 | Fill #2

## 2016-12-05 MED FILL — METFORMIN HCL ER 500 MG TAB: 500 | 30 days supply | Qty: 60 | Fill #2

## 2017-01-11 ENCOUNTER — Encounter: Payer: Self-pay | Admitting: Family Medicine

## 2017-01-24 MED FILL — LISINOPRIL 10 MG TABLET: 10 | 30 days supply | Qty: 15 | Fill #3

## 2017-01-27 MED FILL — AMLODIPINE BESYLATE 10 MG T: 10 | 30 days supply | Qty: 30 | Fill #3

## 2017-01-27 MED FILL — METFORMIN HCL ER 500 MG TAB: 500 | 30 days supply | Qty: 60 | Fill #3

## 2017-01-27 MED FILL — ATORVASTATIN 40 MG TABLET: 40 | 30 days supply | Qty: 30 | Fill #3

## 2017-02-06 ENCOUNTER — Encounter: Payer: Self-pay | Admitting: Family Medicine

## 2017-02-06 ENCOUNTER — Ambulatory Visit: Payer: Medicare HMO | Attending: Family Medicine | Admitting: Family Medicine

## 2017-02-06 VITALS — BP 118/76 | HR 87 | Temp 97.8°F | Wt 217.6 lb

## 2017-02-06 DIAGNOSIS — E119 Type 2 diabetes mellitus without complications: Secondary | ICD-10-CM | POA: Insufficient documentation

## 2017-02-06 DIAGNOSIS — K648 Other hemorrhoids: Secondary | ICD-10-CM | POA: Diagnosis not present

## 2017-02-06 DIAGNOSIS — E1169 Type 2 diabetes mellitus with other specified complication: Secondary | ICD-10-CM

## 2017-02-06 DIAGNOSIS — N529 Male erectile dysfunction, unspecified: Secondary | ICD-10-CM | POA: Insufficient documentation

## 2017-02-06 DIAGNOSIS — E785 Hyperlipidemia, unspecified: Secondary | ICD-10-CM | POA: Diagnosis not present

## 2017-02-06 DIAGNOSIS — I10 Essential (primary) hypertension: Secondary | ICD-10-CM | POA: Insufficient documentation

## 2017-02-06 DIAGNOSIS — Z7984 Long term (current) use of oral hypoglycemic drugs: Secondary | ICD-10-CM | POA: Insufficient documentation

## 2017-02-06 DIAGNOSIS — L293 Anogenital pruritus, unspecified: Secondary | ICD-10-CM | POA: Insufficient documentation

## 2017-02-06 DIAGNOSIS — R159 Full incontinence of feces: Secondary | ICD-10-CM | POA: Diagnosis not present

## 2017-02-06 DIAGNOSIS — Z7982 Long term (current) use of aspirin: Secondary | ICD-10-CM | POA: Insufficient documentation

## 2017-02-06 LAB — GLUCOSE, POCT (MANUAL RESULT ENTRY): POC GLUCOSE: 147 mg/dL — AB (ref 70–99)

## 2017-02-06 LAB — POCT GLYCOSYLATED HEMOGLOBIN (HGB A1C): Hemoglobin A1C: 8

## 2017-02-06 MED ORDER — LISINOPRIL 10 MG PO TABS: 10.0000 mg | ORAL_TABLET | Freq: Every day | ORAL | 2 refills | Status: DC

## 2017-02-06 MED ORDER — ASPIRIN EC 81 MG PO TBEC: 81.0000 mg | DELAYED_RELEASE_TABLET | Freq: Every day | ORAL | 11 refills | Status: AC

## 2017-02-06 MED ORDER — SILDENAFIL CITRATE 100 MG PO TABS: 50.0000 mg | ORAL_TABLET | Freq: Every day | ORAL | 2 refills | Status: DC | PRN

## 2017-02-06 MED ORDER — METFORMIN HCL ER 500 MG PO TB24: 1000.0000 mg | ORAL_TABLET | Freq: Every day | ORAL | 11 refills | Status: DC

## 2017-02-06 MED ORDER — ATORVASTATIN CALCIUM 40 MG PO TABS: 40.0000 mg | ORAL_TABLET | Freq: Every day | ORAL | 3 refills | Status: DC

## 2017-02-06 MED ORDER — AMLODIPINE BESYLATE 10 MG PO TABS: 10.0000 mg | ORAL_TABLET | Freq: Every day | ORAL | 11 refills | Status: DC

## 2017-02-06 MED FILL — LISINOPRIL 10 MG TABLET: 10 | 30 days supply | Qty: 30 | Fill #0

## 2017-02-06 MED FILL — AMLODIPINE BESYLATE 10 MG T: 10 | 30 days supply | Qty: 30 | Fill #0

## 2017-02-06 MED FILL — METFORMIN HCL ER 500 MG TAB: 500 | 30 days supply | Qty: 60 | Fill #0

## 2017-02-06 NOTE — Assessment & Plan Note (Signed)
Slight decline Continue metformin 1000 mg daily in the morning Decrease sugar intake by reducing juice to 4 oz per day

## 2017-02-06 NOTE — Assessment & Plan Note (Signed)
Continue Lipitor 40 mg daily and aspirin 81 mg daily

## 2017-02-06 NOTE — Progress Notes (Signed)
Subjective:  Patient ID: Logan Knight, male    DOB: 1950-10-17  Age: 66 y.o. MRN: [id number]  CC: Diabetes and Hypertension   HPI Logan Knight presents for   1. CHRONIC HYPERTENSION  Disease Monitoring  Blood pressure range: not checking   Chest pain: no   Dyspnea: no   Claudication: no   Medication compliance: yes  Medication Side Effects  Lightheadedness: no   Urinary frequency: no   Edema: no   Impotence: yes, for past two years    2. CHRONIC DIABETES  Disease Monitoring  Blood Sugar Ranges:   Fasting: cannot recall readings   Polyuria: no   Visual problems: no   Medication Compliance: yes  Medication Side Effects  Hypoglycemia: no   Drinking 1/2 cup juice mixed with water from 32 oz cup throughout the  day.    3. Itching on head of penis: x 2 months. No rash. He changed adult diaper brand. He wears adult diapers due to hemorrhoids associated with mild fecal incontinence.   Social History  Substance Use Topics  . Smoking status: Never Smoker  . Smokeless tobacco: Never Used  . Alcohol use Yes     Comment: 2 40 oz a week or 3-4 cans a weeks    Outpatient Medications Prior to Visit  Medication Sig Dispense Refill  . amLODipine (NORVASC) 10 MG tablet Take 1 tablet (10 mg total) by mouth daily. 30 tablet 11  . aspirin EC 81 MG tablet Take 1 tablet (81 mg total) by mouth daily. 30 tablet 11  . atorvastatin (LIPITOR) 40 MG tablet Take 1 tablet (40 mg total) by mouth daily. 90 tablet 3  . Blood Glucose Monitoring Suppl (TRUERESULT BLOOD GLUCOSE) W/DEVICE KIT 1 each by Does not apply route once. 1 each 0  . glucose blood (TRUETEST TEST) test strip Use as instructed 100 each 12  . Lancet Device MISC 1 each by Does not apply route once. 100 each 5  . lisinopril (PRINIVIL,ZESTRIL) 10 MG tablet Take 1 tablet (10 mg total) by mouth daily. 30 tablet 2  . metFORMIN (GLUCOPHAGE XR) 500 MG 24 hr tablet Take 2 tablets (1,000 mg total) by mouth daily with breakfast.  60 tablet 5  . sildenafil (VIAGRA) 100 MG tablet Take 0.5-1 tablets (50-100 mg total) by mouth daily as needed for erectile dysfunction. For PASS (Patient not taking: Reported on 02/06/2017) 30 tablet 2  . terbinafine (LAMISIL) 250 MG tablet Take 1 tablet (250 mg total) by mouth daily. (Patient not taking: Reported on 02/06/2017) 30 tablet 2  . Vitamin D, Ergocalciferol, (DRISDOL) 50000 units CAPS capsule Take 1 capsule (50,000 Units total) by mouth every 7 (seven) days. (Patient not taking: Reported on 02/06/2017) 12 capsule 0   No facility-administered medications prior to visit.     ROS Review of Systems  Constitutional: Negative for chills, fatigue, fever and unexpected weight change.  Eyes: Negative for visual disturbance.  Respiratory: Negative for cough and shortness of breath.   Cardiovascular: Negative for chest pain, palpitations and leg swelling.  Gastrointestinal: Negative for abdominal pain, blood in stool, constipation, diarrhea, nausea and vomiting.  Endocrine: Negative for polydipsia, polyphagia and polyuria.  Musculoskeletal: Negative for arthralgias, back pain, gait problem, myalgias and neck pain.  Skin: Negative for rash.  Allergic/Immunologic: Negative for immunocompromised state.  Hematological: Negative for adenopathy. Does not bruise/bleed easily.  Psychiatric/Behavioral: Negative for dysphoric mood, sleep disturbance and suicidal ideas. The patient is not nervous/anxious.     Objective:  BP 118/76   Pulse 87   Temp 97.8 F (36.6 C) (Oral)   Wt 217 lb 9.6 oz (98.7 kg)   SpO2 99%   BMI 28.71 kg/m   BP/Weight 02/06/2017 07/28/2016 33/43/5686  Systolic BP 168 372 902  Diastolic BP 76 77 94  Wt. (Lbs) 217.6 222.6 220.6  BMI 28.71 29.37 29.1   Physical Exam  Constitutional: He appears well-developed and well-nourished. No distress.  HENT:  Head: Normocephalic and atraumatic.  Neck: Normal range of motion. Neck supple.  Cardiovascular: Normal rate, regular  rhythm, normal heart sounds and intact distal pulses.   Pulmonary/Chest: Effort normal and breath sounds normal.  Genitourinary:    Circumcised.  Musculoskeletal: He exhibits no edema.  Neurological: He is alert.  Skin: Skin is warm and dry. No rash noted. No erythema.  Psychiatric: He has a normal mood and affect.   Lab Results  Component Value Date   HGBA1C 8.0 02/06/2017   CBG 147  Assessment & Plan:   Logan Knight was seen today for diabetes and hypertension.  Diagnoses and all orders for this visit:  Controlled type 2 diabetes mellitus without complication, without long-term current use of insulin (HCC) -     POCT glucose (manual entry) -     POCT glycosylated hemoglobin (Hb A1C) -     aspirin EC 81 MG tablet; Take 1 tablet (81 mg total) by mouth daily. -     metFORMIN (GLUCOPHAGE XR) 500 MG 24 hr tablet; Take 2 tablets (1,000 mg total) by mouth daily with breakfast. -     lisinopril (PRINIVIL,ZESTRIL) 10 MG tablet; Take 1 tablet (10 mg total) by mouth daily.  Erectile dysfunction, unspecified erectile dysfunction type -     sildenafil (VIAGRA) 100 MG tablet; Take 0.5-1 tablets (50-100 mg total) by mouth daily as needed for erectile dysfunction.  Essential hypertension -     amLODipine (NORVASC) 10 MG tablet; Take 1 tablet (10 mg total) by mouth daily. -     aspirin EC 81 MG tablet; Take 1 tablet (81 mg total) by mouth daily. -     lisinopril (PRINIVIL,ZESTRIL) 10 MG tablet; Take 1 tablet (10 mg total) by mouth daily.  Hyperlipidemia associated with type 2 diabetes mellitus (HCC) -     aspirin EC 81 MG tablet; Take 1 tablet (81 mg total) by mouth daily. -     atorvastatin (LIPITOR) 40 MG tablet; Take 1 tablet (40 mg total) by mouth daily.   No orders of the defined types were placed in this encounter.   Follow-up: Return in about 3 months (around 05/09/2017) for HTN and diabetes .   Boykin Nearing MD

## 2017-02-06 NOTE — Assessment & Plan Note (Signed)
Itching on head of penis Normal exam Plan: Topical benadryl cream  Consider topical steroid if symptoms worsen or fail to improve

## 2017-02-06 NOTE — Assessment & Plan Note (Signed)
Refilled Viagra

## 2017-02-06 NOTE — Assessment & Plan Note (Signed)
A: normal BP Med: compliant P: Continue norvasc 10 mg daily and lisinopril 10 mg daily

## 2017-02-06 NOTE — Patient Instructions (Addendum)
Kenston was seen today for diabetes and hypertension.  Diagnoses and all orders for this visit:  Controlled type 2 diabetes mellitus without complication, without long-term current use of insulin (HCC) -     POCT glucose (manual entry) -     POCT glycosylated hemoglobin (Hb A1C) -     aspirin EC 81 MG tablet; Take 1 tablet (81 mg total) by mouth daily. -     metFORMIN (GLUCOPHAGE XR) 500 MG 24 hr tablet; Take 2 tablets (1,000 mg total) by mouth daily with breakfast. -     lisinopril (PRINIVIL,ZESTRIL) 10 MG tablet; Take 1 tablet (10 mg total) by mouth daily.  Erectile dysfunction, unspecified erectile dysfunction type -     sildenafil (VIAGRA) 100 MG tablet; Take 0.5-1 tablets (50-100 mg total) by mouth daily as needed for erectile dysfunction.  Essential hypertension -     amLODipine (NORVASC) 10 MG tablet; Take 1 tablet (10 mg total) by mouth daily. -     aspirin EC 81 MG tablet; Take 1 tablet (81 mg total) by mouth daily. -     lisinopril (PRINIVIL,ZESTRIL) 10 MG tablet; Take 1 tablet (10 mg total) by mouth daily.  Hyperlipidemia associated with type 2 diabetes mellitus (HCC) -     aspirin EC 81 MG tablet; Take 1 tablet (81 mg total) by mouth daily. -     atorvastatin (LIPITOR) 40 MG tablet; Take 1 tablet (40 mg total) by mouth daily.   Your blood pressure is well controlled Your diabetes needs a little work  Reduce juice intake to 4 oz per day.  Transition to sparking water, you may also add lemon juice to water.  Diabetes blood sugar goals  Fasting (in AM before breakfast, 8 hrs of no eating or drinking (except water or unsweetened coffee or tea): 90-110 2 hrs after meals: < 160,   No low sugars: nothing < 70    F/u in 3 months for HTN and diabetes  Dr. Adrian Blackwater

## 2017-03-20 MED FILL — ATORVASTATIN 40 MG TABLET: 40 | 30 days supply | Qty: 30 | Fill #4

## 2017-03-23 ENCOUNTER — Encounter: Payer: Self-pay | Admitting: Family Medicine

## 2017-03-23 ENCOUNTER — Ambulatory Visit: Payer: Medicare HMO | Attending: Family Medicine | Admitting: Family Medicine

## 2017-03-23 VITALS — BP 157/79 | HR 92 | Temp 98.4°F | Ht 73.0 in | Wt 215.4 lb

## 2017-03-23 DIAGNOSIS — N529 Male erectile dysfunction, unspecified: Secondary | ICD-10-CM | POA: Diagnosis not present

## 2017-03-23 DIAGNOSIS — E119 Type 2 diabetes mellitus without complications: Secondary | ICD-10-CM | POA: Insufficient documentation

## 2017-03-23 DIAGNOSIS — Z7982 Long term (current) use of aspirin: Secondary | ICD-10-CM | POA: Insufficient documentation

## 2017-03-23 DIAGNOSIS — Z79899 Other long term (current) drug therapy: Secondary | ICD-10-CM | POA: Diagnosis not present

## 2017-03-23 DIAGNOSIS — K0510 Chronic gingivitis, plaque induced: Secondary | ICD-10-CM | POA: Diagnosis not present

## 2017-03-23 DIAGNOSIS — Z7984 Long term (current) use of oral hypoglycemic drugs: Secondary | ICD-10-CM | POA: Diagnosis not present

## 2017-03-23 DIAGNOSIS — I10 Essential (primary) hypertension: Secondary | ICD-10-CM | POA: Insufficient documentation

## 2017-03-23 LAB — GLUCOSE, POCT (MANUAL RESULT ENTRY): POC GLUCOSE: 152 mg/dL — AB (ref 70–99)

## 2017-03-23 MED ORDER — AMOXICILLIN 500 MG PO CAPS: 500.0000 mg | ORAL_CAPSULE | Freq: Three times a day (TID) | ORAL | 0 refills | Status: DC

## 2017-03-23 MED ORDER — SILDENAFIL CITRATE 100 MG PO TABS: 50.0000 mg | ORAL_TABLET | Freq: Every day | ORAL | 2 refills | Status: DC | PRN

## 2017-03-23 MED FILL — AMOXICILLIN 500 MG CAPSULE: 500 | 10 days supply | Qty: 30 | Fill #0

## 2017-03-23 NOTE — Assessment & Plan Note (Signed)
Please take amoxicillin for gum swelling and pain Please see dental list and call for provide accepting new patient

## 2017-03-23 NOTE — Patient Instructions (Addendum)
Logan Knight was seen today for dental pain.  Diagnoses and all orders for this visit:  Controlled type 2 diabetes mellitus without complication, without long-term current use of insulin (HCC) -     POCT glucose (manual entry)  Erectile dysfunction, unspecified erectile dysfunction type -     sildenafil (VIAGRA) 100 MG tablet; Take 0.5-1 tablets (50-100 mg total) by mouth daily as needed for erectile dysfunction.  Gingivitis -     amoxicillin (AMOXIL) 500 MG capsule; Take 1 capsule (500 mg total) by mouth 3 (three) times daily.   Please take amoxicillin for gum swelling and pain Please see dental list and call for provide accepting new patient   F/u in 2 months for diabetes, HTN and flu shot   Dr.  Adrian Blackwater   Starting on April 17, 2017 I will be seeing patient at [facility]. You are welcome to follow up with me there if you like. [facility] accepts insurance and self pay.   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

## 2017-03-23 NOTE — Progress Notes (Signed)
Subjective:  Patient ID: Logan Knight, male    DOB: March 17, 1951  Age: 66 y.o. MRN: [id number]  CC: Dental Pain   HPI Logan Knight has diabetes and HTN he presents for   1. Dental pain: with gum swelling. Started 3 weeks ago. Upper molars on both sides. Improved over time. Today is the best its felt. He still has trouble chewing. He has cut down on sugar intake to improve his diabetes. He does not have a dental home. Denies fever and chills.    Social History  Substance Use Topics  . Smoking status: Never Smoker  . Smokeless tobacco: Never Used  . Alcohol use Yes     Comment: 2 40 oz a week or 3-4 cans a weeks    Outpatient Medications Prior to Visit  Medication Sig Dispense Refill  . amLODipine (NORVASC) 10 MG tablet Take 1 tablet (10 mg total) by mouth daily. 30 tablet 11  . aspirin EC 81 MG tablet Take 1 tablet (81 mg total) by mouth daily. 30 tablet 11  . atorvastatin (LIPITOR) 40 MG tablet Take 1 tablet (40 mg total) by mouth daily. 90 tablet 3  . Blood Glucose Monitoring Suppl (TRUERESULT BLOOD GLUCOSE) W/DEVICE KIT 1 each by Does not apply route once. 1 each 0  . glucose blood (TRUETEST TEST) test strip Use as instructed 100 each 12  . Lancet Device MISC 1 each by Does not apply route once. 100 each 5  . lisinopril (PRINIVIL,ZESTRIL) 10 MG tablet Take 1 tablet (10 mg total) by mouth daily. 30 tablet 2  . metFORMIN (GLUCOPHAGE XR) 500 MG 24 hr tablet Take 2 tablets (1,000 mg total) by mouth daily with breakfast. 60 tablet 11  . sildenafil (VIAGRA) 100 MG tablet Take 0.5-1 tablets (50-100 mg total) by mouth daily as needed for erectile dysfunction. 30 tablet 2   No facility-administered medications prior to visit.     ROS Review of Systems  Constitutional: Negative for chills, fatigue, fever and unexpected weight change.  HENT: Positive for dental problem.   Eyes: Negative for visual disturbance.  Respiratory: Negative for cough and shortness of breath.     Cardiovascular: Negative for chest pain, palpitations and leg swelling.  Gastrointestinal: Negative for abdominal pain, blood in stool, constipation, diarrhea, nausea and vomiting.  Endocrine: Negative for polydipsia, polyphagia and polyuria.  Musculoskeletal: Negative for arthralgias, back pain, gait problem, myalgias and neck pain.  Skin: Negative for rash.  Allergic/Immunologic: Negative for immunocompromised state.  Hematological: Negative for adenopathy. Does not bruise/bleed easily.  Psychiatric/Behavioral: Negative for dysphoric mood, sleep disturbance and suicidal ideas. The patient is not nervous/anxious.     Objective:  BP (!) 157/79   Pulse 92   Temp 98.4 F (36.9 C) (Oral)   Ht '6\' 1"'$  (1.854 m)   Wt 215 lb 6.4 oz (97.7 kg)   SpO2 98%   BMI 28.42 kg/m   BP/Weight 03/23/2017 02/06/2017 56/43/3295  Systolic BP 188 416 606  Diastolic BP 79 76 77  Wt. (Lbs) 215.4 217.6 222.6  BMI 28.42 28.71 29.37    Physical Exam  Constitutional: He appears well-developed and well-nourished. No distress.  HENT:  Head: Normocephalic and atraumatic.  Mouth/Throat: Abnormal dentition. Dental caries present.    Neck: Normal range of motion. Neck supple.  Cardiovascular: Normal rate, regular rhythm, normal heart sounds and intact distal pulses.   Pulmonary/Chest: Effort normal and breath sounds normal.  Musculoskeletal: He exhibits no edema.  Neurological: He is alert.  Skin: Skin is warm and dry. No rash noted. No erythema.  Psychiatric: He has a normal mood and affect.    Lab Results  Component Value Date   HGBA1C 8.0 02/06/2017   CBG 152 Assessment & Plan:  Daundre was seen today for dental pain.  Diagnoses and all orders for this visit:  Controlled type 2 diabetes mellitus without complication, without long-term current use of insulin (HCC) -     POCT glucose (manual entry)  Erectile dysfunction, unspecified erectile dysfunction type -     sildenafil (VIAGRA) 100 MG  tablet; Take 0.5-1 tablets (50-100 mg total) by mouth daily as needed for erectile dysfunction.  Gingivitis -     amoxicillin (AMOXIL) 500 MG capsule; Take 1 capsule (500 mg total) by mouth 3 (three) times daily.    No orders of the defined types were placed in this encounter.   Follow-up: Return in about 2 months (around 05/24/2017) for HTN and diabetes follow up and flu shot.   Boykin Nearing MD

## 2017-03-27 MED FILL — !VIAGRA 100 MG TABLET: 100 MG | 30 days supply | Qty: 3 | Fill #0

## 2017-03-28 ENCOUNTER — Ambulatory Visit: Payer: Medicare HMO | Attending: Internal Medicine

## 2017-04-04 ENCOUNTER — Other Ambulatory Visit: Payer: Self-pay | Admitting: Pharmacy Technician

## 2017-04-04 ENCOUNTER — Other Ambulatory Visit: Payer: Self-pay | Admitting: Pharmacist

## 2017-04-04 DIAGNOSIS — E785 Hyperlipidemia, unspecified: Secondary | ICD-10-CM

## 2017-04-04 DIAGNOSIS — E1169 Type 2 diabetes mellitus with other specified complication: Secondary | ICD-10-CM

## 2017-04-04 DIAGNOSIS — E119 Type 2 diabetes mellitus without complications: Secondary | ICD-10-CM

## 2017-04-04 DIAGNOSIS — I10 Essential (primary) hypertension: Secondary | ICD-10-CM

## 2017-04-04 MED ORDER — METFORMIN HCL ER 500 MG PO TB24: 1000.0000 mg | ORAL_TABLET | Freq: Every day | ORAL | 2 refills | Status: DC

## 2017-04-04 MED ORDER — AMLODIPINE BESYLATE 10 MG PO TABS: 10.0000 mg | ORAL_TABLET | Freq: Every day | ORAL | 2 refills | Status: DC

## 2017-04-04 MED ORDER — LISINOPRIL 10 MG PO TABS: 10.0000 mg | ORAL_TABLET | Freq: Every day | ORAL | 0 refills | Status: DC

## 2017-04-04 MED ORDER — ATORVASTATIN CALCIUM 40 MG PO TABS: 40.0000 mg | ORAL_TABLET | Freq: Every day | ORAL | 2 refills | Status: DC

## 2017-04-04 NOTE — Telephone Encounter (Signed)
Received message from Etter Sjogren, CPhT with [facility], and patient requested medications be sent to [facility] for mail order. Requested medications sent to [facility].

## 2017-04-04 NOTE — Patient Outreach (Signed)
[facility]) Care Management  04/04/2017  Logan Knight 1950/12/09 [id number]   Contacted patient in regards to Metformin medication adherence. No answer, Left [employer] appropriate voicemail.  Maud Deed [place], [place] Management [phone]

## 2017-04-04 NOTE — Patient Outreach (Signed)
[facility]) Care Management  04/04/2017  Logan Knight [birth year] [id number]   Patient returned my call. I verified DOB. Informed patient that I am calling from [place] in regards to Metformin medication adherence. Patients states he takes 2 tablets daily and has no issues taking it. I also informed patient of [employer] Tier 1 and 2 generic 0 co-pay list. I offered to contact patients MD to have new scripts sent into [facility]. Patient authorized for me to do so. I am requesting that Amlodipine, Atorvastatin, Lisinopril and Metformin scripts be sent into [facility] mail order for a 3 month supply.  Maud Deed [place], [place] Management [phone]

## 2017-04-05 ENCOUNTER — Other Ambulatory Visit: Payer: Self-pay | Admitting: Pharmacy Technician

## 2017-04-05 NOTE — Patient Outreach (Signed)
[facility]) Care Management  04/05/2017  Logan Knight 1950/12/24 [id number]   Contacted patient to follow up on having his maintenance medication scripts sent into [facility] mail order. No answer, Left [employer] appropriate voicemail.  Maud Deed [place], [place] Management [phone]

## 2017-05-02 MED FILL — !VIAGRA 100 MG TABLET: 100 MG | 30 days supply | Qty: 3 | Fill #1

## 2017-05-23 MED FILL — !VIAGRA 100 MG TABLET: 100 MG | 30 days supply | Qty: 3 | Fill #2

## 2017-06-02 ENCOUNTER — Other Ambulatory Visit: Payer: Self-pay | Admitting: *Deleted

## 2017-06-02 DIAGNOSIS — N529 Male erectile dysfunction, unspecified: Secondary | ICD-10-CM

## 2017-06-02 MED ORDER — SILDENAFIL CITRATE 100 MG PO TABS: 50.0000 mg | ORAL_TABLET | Freq: Every day | ORAL | 3 refills | Status: DC | PRN

## 2017-06-02 NOTE — Telephone Encounter (Signed)
PRINTED FOR PASS PROGRAM 

## 2017-06-19 ENCOUNTER — Ambulatory Visit: Payer: Medicare HMO | Attending: Internal Medicine | Admitting: Internal Medicine

## 2017-06-19 ENCOUNTER — Encounter: Payer: Self-pay | Admitting: Internal Medicine

## 2017-06-19 VITALS — BP 156/89 | HR 87 | Temp 98.6°F | Resp 16 | Wt 217.0 lb

## 2017-06-19 DIAGNOSIS — R911 Solitary pulmonary nodule: Secondary | ICD-10-CM | POA: Diagnosis not present

## 2017-06-19 DIAGNOSIS — N529 Male erectile dysfunction, unspecified: Secondary | ICD-10-CM | POA: Diagnosis not present

## 2017-06-19 DIAGNOSIS — E785 Hyperlipidemia, unspecified: Secondary | ICD-10-CM | POA: Insufficient documentation

## 2017-06-19 DIAGNOSIS — E1169 Type 2 diabetes mellitus with other specified complication: Secondary | ICD-10-CM

## 2017-06-19 DIAGNOSIS — R1031 Right lower quadrant pain: Secondary | ICD-10-CM | POA: Diagnosis not present

## 2017-06-19 DIAGNOSIS — L299 Pruritus, unspecified: Secondary | ICD-10-CM | POA: Insufficient documentation

## 2017-06-19 DIAGNOSIS — Z23 Encounter for immunization: Secondary | ICD-10-CM | POA: Diagnosis not present

## 2017-06-19 DIAGNOSIS — E1165 Type 2 diabetes mellitus with hyperglycemia: Secondary | ICD-10-CM | POA: Diagnosis not present

## 2017-06-19 DIAGNOSIS — E559 Vitamin D deficiency, unspecified: Secondary | ICD-10-CM | POA: Insufficient documentation

## 2017-06-19 DIAGNOSIS — Z7982 Long term (current) use of aspirin: Secondary | ICD-10-CM | POA: Diagnosis not present

## 2017-06-19 DIAGNOSIS — E119 Type 2 diabetes mellitus without complications: Secondary | ICD-10-CM | POA: Diagnosis not present

## 2017-06-19 DIAGNOSIS — I10 Essential (primary) hypertension: Secondary | ICD-10-CM | POA: Diagnosis not present

## 2017-06-19 DIAGNOSIS — K648 Other hemorrhoids: Secondary | ICD-10-CM | POA: Diagnosis not present

## 2017-06-19 DIAGNOSIS — Z79899 Other long term (current) drug therapy: Secondary | ICD-10-CM | POA: Diagnosis not present

## 2017-06-19 DIAGNOSIS — Z833 Family history of diabetes mellitus: Secondary | ICD-10-CM | POA: Diagnosis not present

## 2017-06-19 DIAGNOSIS — IMO0001 Reserved for inherently not codable concepts without codable children: Secondary | ICD-10-CM

## 2017-06-19 LAB — GLUCOSE, POCT (MANUAL RESULT ENTRY): POC Glucose: 152 mg/dl — AB (ref 70–99)

## 2017-06-19 LAB — POCT GLYCOSYLATED HEMOGLOBIN (HGB A1C): Hemoglobin A1C: 7.4

## 2017-06-19 MED ORDER — LISINOPRIL 10 MG PO TABS: 10.0000 mg | ORAL_TABLET | Freq: Every day | ORAL | 1 refills | Status: DC

## 2017-06-19 MED ORDER — METFORMIN HCL ER 500 MG PO TB24: ORAL_TABLET | ORAL | 2 refills | Status: DC

## 2017-06-19 MED ORDER — AMLODIPINE BESYLATE 10 MG PO TABS: 10.0000 mg | ORAL_TABLET | Freq: Every day | ORAL | 2 refills | Status: DC

## 2017-06-19 MED ORDER — ATORVASTATIN CALCIUM 40 MG PO TABS: 40.0000 mg | ORAL_TABLET | Freq: Every day | ORAL | 2 refills | Status: DC

## 2017-06-19 NOTE — Patient Instructions (Addendum)
Increase Metformin to 500 mg two tabs in morning and one in evening. Pneumococcal Conjugate Vaccine (PCV13) What You Need to Know 1. Why get vaccinated? Vaccination can protect both children and adults from pneumococcal disease. Pneumococcal disease is caused by bacteria that can spread from person to person through close contact. It can cause ear infections, and it can also lead to more serious infections of the:  Lungs (pneumonia),  Blood (bacteremia), and  Covering of the brain and spinal cord (meningitis).  Pneumococcal pneumonia is most common among adults. Pneumococcal meningitis can cause deafness and brain damage, and it kills about 1 child in 10 who get it. Anyone can get pneumococcal disease, but children under 14 years of age and adults 22 years and older, people with certain medical conditions, and cigarette smokers are at the highest risk. Before there was a vaccine, the [place] saw:  more than 700 cases of meningitis,  about 13,000 blood infections,  about 5 million ear infections, and  about 200 deaths  in children under 5 each year from pneumococcal disease. Since vaccine became available, severe pneumococcal disease in these children has fallen by 88%. About 18,000 older adults die of pneumococcal disease each year in the [place]. Treatment of pneumococcal infections with penicillin and other drugs is not as effective as it used to be, because some strains of the disease have become resistant to these drugs. This makes prevention of the disease, through vaccination, even more important. 2. PCV13 vaccine Pneumococcal conjugate vaccine (called PCV13) protects against 13 types of pneumococcal bacteria. PCV13 is routinely given to children at 2, 4, 6, and 26-65 months of age. It is also recommended for children and adults 45 to 6 years of age with certain health conditions, and for all adults 25 years of age and older. Your doctor can give you details. 3. Some  people should not get this vaccine Anyone who has ever had a life-threatening allergic reaction to a dose of this vaccine, to an earlier pneumococcal vaccine called PCV7, or to any vaccine containing diphtheria toxoid (for example, DTaP), should not get PCV13. Anyone with a severe allergy to any component of PCV13 should not get the vaccine. Tell your doctor if the person being vaccinated has any severe allergies. If the person scheduled for vaccination is not feeling well, your healthcare provider might decide to reschedule the shot on another day. 4. Risks of a vaccine reaction With any medicine, including vaccines, there is a chance of reactions. These are usually mild and go away on their own, but serious reactions are also possible. Problems reported following PCV13 varied by age and dose in the series. The most common problems reported among children were:  About half became drowsy after the shot, had a temporary loss of appetite, or had redness or tenderness where the shot was given.  About 1 out of 3 had swelling where the shot was given.  About 1 out of 3 had a mild fever, and about 1 in 20 had a fever over 102.20F.  Up to about 8 out of 10 became fussy or irritable.  Adults have reported pain, redness, and swelling where the shot was given; also mild fever, fatigue, headache, chills, or muscle pain. Young children who get PCV13 along with inactivated flu vaccine at the same time may be at increased risk for seizures caused by fever. Ask your doctor for more information. Problems that could happen after any vaccine:  People sometimes faint after a medical procedure,  including vaccination. Sitting or lying down for about 15 minutes can help prevent fainting, and injuries caused by a fall. Tell your doctor if you feel dizzy, or have vision changes or ringing in the ears.  Some older children and adults get severe pain in the shoulder and have difficulty moving the arm where a shot was  given. This happens very rarely.  Any medication can cause a severe allergic reaction. Such reactions from a vaccine are very rare, estimated at about 1 in a million doses, and would happen within a few minutes to a few hours after the vaccination. As with any medicine, there is a very small chance of a vaccine causing a serious injury or death. The safety of vaccines is always being monitored. For more information, visit: http://www.aguilar.org/ 5. What if there is a serious reaction? What should I look for? Look for anything that concerns you, such as signs of a severe allergic reaction, very high fever, or unusual behavior. Signs of a severe allergic reaction can include hives, swelling of the face and throat, difficulty breathing, a fast heartbeat, dizziness, and weakness-usually within a few minutes to a few hours after the vaccination. What should I do?  If you think it is a severe allergic reaction or other emergency that can't wait, call 9-1-1 or get the person to the nearest hospital. Otherwise, call your doctor.  Reactions should be reported to the Vaccine Adverse Event Reporting System (VAERS). Your doctor should file this report, or you can do it yourself through the VAERS web site at www.vaers.SamedayNews.es, or by calling (905)829-2943. ? VAERS does not give medical advice. 6. The National Vaccine Injury Compensation Program The [employer] Vaccine Injury Compensation Program (VICP) is a federal program that was created to compensate people who may have been injured by certain vaccines. Persons who believe they may have been injured by a vaccine can learn about the program and about filing a claim by calling (651)220-6202 or visiting the [place] website at GoldCloset.com.ee. There is a time limit to file a claim for compensation. 7. How can I learn more?  Ask your healthcare provider. He or she can give you the vaccine package insert or suggest other sources of  information.  Call your local or state health department.  Contact the Centers for Disease Control and Prevention (CDC): ? Call (480) 682-2879 (1-800-CDC-INFO) or ? Visit CDC's website at http://hunter.com/ Vaccine Information Statement, PCV13 Vaccine (07/03/2014) This information is not intended to replace advice given to you by your health care provider. Make sure you discuss any questions you have with your health care provider. Document Released: 06/12/2006 Document Revised: 05/05/2016 Document Reviewed: 05/05/2016 Elsevier Interactive Patient Education  2017 [place].   Influenza Virus Vaccine injection (Fluarix) What is this medicine? INFLUENZA VIRUS VACCINE (in floo EN zuh VAHY ruhs vak SEEN) helps to reduce the risk of getting influenza also known as the flu. This medicine may be used for other purposes; ask your health care provider or pharmacist if you have questions. COMMON BRAND NAME(S): Fluarix, Fluzone What should I tell my health care provider before I take this medicine? They need to know if you have any of these conditions: -bleeding disorder like hemophilia -fever or infection -Guillain-Barre syndrome or other neurological problems -immune system problems -infection with the human immunodeficiency virus (HIV) or AIDS -low blood platelet counts -multiple sclerosis -an unusual or allergic reaction to influenza virus vaccine, eggs, chicken proteins, latex, gentamicin, other medicines, foods, dyes or preservatives -pregnant or trying to  get pregnant -breast-feeding How should I use this medicine? This vaccine is for injection into a muscle. It is given by a health care professional. A copy of Vaccine Information Statements will be given before each vaccination. Read this sheet carefully each time. The sheet may change frequently. Talk to your pediatrician regarding the use of this medicine in children. Special care may be needed. Overdosage: If you think you  have taken too much of this medicine contact a poison control center or emergency room at once. NOTE: This medicine is only for you. Do not share this medicine with others. What if I miss a dose? This does not apply. What may interact with this medicine? -chemotherapy or radiation therapy -medicines that lower your immune system like etanercept, anakinra, infliximab, and adalimumab -medicines that treat or prevent blood clots like warfarin -phenytoin -steroid medicines like prednisone or cortisone -theophylline -vaccines This list may not describe all possible interactions. Give your health care provider a list of all the medicines, herbs, non-prescription drugs, or dietary supplements you use. Also tell them if you smoke, drink alcohol, or use illegal drugs. Some items may interact with your medicine. What should I watch for while using this medicine? Report any side effects that do not go away within 3 days to your doctor or health care professional. Call your health care provider if any unusual symptoms occur within 6 weeks of receiving this vaccine. You may still catch the flu, but the illness is not usually as bad. You cannot get the flu from the vaccine. The vaccine will not protect against colds or other illnesses that may cause fever. The vaccine is needed every year. What side effects may I notice from receiving this medicine? Side effects that you should report to your doctor or health care professional as soon as possible: -allergic reactions like skin rash, itching or hives, swelling of the face, lips, or tongue Side effects that usually do not require medical attention (report to your doctor or health care professional if they continue or are bothersome): -fever -headache -muscle aches and pains -pain, tenderness, redness, or swelling at site where injected -weak or tired This list may not describe all possible side effects. Call your doctor for medical advice about side  effects. You may report side effects to FDA at 1-800-FDA-1088. Where should I keep my medicine? This vaccine is only given in a clinic, pharmacy, doctor's office, or other health care setting and will not be stored at home. NOTE: This sheet is a summary. It may not cover all possible information. If you have questions about this medicine, talk to your doctor, pharmacist, or health care provider.  2018 Elsevier/Gold Standard (2008-03-12 09:30:40)
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
## 2017-06-20 ENCOUNTER — Telehealth: Payer: Self-pay | Admitting: Internal Medicine

## 2017-06-20 ENCOUNTER — Ambulatory Visit: Payer: Medicare HMO | Attending: Internal Medicine

## 2017-06-20 DIAGNOSIS — E1165 Type 2 diabetes mellitus with hyperglycemia: Secondary | ICD-10-CM | POA: Insufficient documentation

## 2017-06-20 NOTE — Progress Notes (Signed)
Patient here for lab visit only 

## 2017-06-20 NOTE — Telephone Encounter (Signed)
Will send to pcp ?

## 2017-06-20 NOTE — Telephone Encounter (Signed)
Pt called to request referral for physical therapy or orthopedic, since he has pain on his knee and wants someone to check on that pl follow up.

## 2017-06-20 NOTE — Telephone Encounter (Signed)
Pt. Came to facility requesting to speak with his nurse regarding his knee. Pt. States his new has been bothering him and he forgot to tell his PCP. Please f/u with pt.

## 2017-06-21 ENCOUNTER — Telehealth: Payer: Self-pay

## 2017-06-21 ENCOUNTER — Telehealth: Payer: Self-pay | Admitting: Internal Medicine

## 2017-06-21 LAB — COMPREHENSIVE METABOLIC PANEL
A/G RATIO: 1.3 (ref 1.2–2.2)
ALK PHOS: 68 IU/L (ref 39–117)
ALT: 18 IU/L (ref 0–44)
AST: 20 IU/L (ref 0–40)
Albumin: 4.3 g/dL (ref 3.6–4.8)
BUN/Creatinine Ratio: 12 (ref 10–24)
BUN: 15 mg/dL (ref 8–27)
Bilirubin Total: 0.3 mg/dL (ref 0.0–1.2)
CO2: 23 mmol/L (ref 20–29)
Calcium: 9.9 mg/dL (ref 8.6–10.2)
Chloride: 104 mmol/L (ref 96–106)
Creatinine, Ser: 1.25 mg/dL (ref 0.76–1.27)
GFR calc Af Amer: 69 mL/min/{1.73_m2} (ref >59)
GFR, EST NON AFRICAN AMERICAN: 60 mL/min/{1.73_m2} (ref >59)
GLOBULIN, TOTAL: 3.3 g/dL (ref 1.5–4.5)
Glucose: 142 mg/dL — ABNORMAL HIGH (ref 65–99)
POTASSIUM: 3.9 mmol/L (ref 3.5–5.2)
SODIUM: 143 mmol/L (ref 134–144)
Total Protein: 7.6 g/dL (ref 6.0–8.5)

## 2017-06-21 LAB — LIPID PANEL
Chol/HDL Ratio: 4.6 ratio (ref 0.0–5.0)
Cholesterol, Total: 137 mg/dL (ref 100–199)
HDL: 30 mg/dL — ABNORMAL LOW
LDL Calculated: 62 mg/dL (ref 0–99)
Triglycerides: 226 mg/dL — ABNORMAL HIGH (ref 0–149)
VLDL Cholesterol Cal: 45 mg/dL — ABNORMAL HIGH (ref 5–40)

## 2017-06-21 LAB — PSA: Prostate Specific Ag, Serum: 5.2 ng/mL — ABNORMAL HIGH (ref 0.0–4.0)

## 2017-06-21 LAB — CBC
Hematocrit: 39.9 % (ref 37.5–51.0)
Hemoglobin: 13.6 g/dL (ref 13.0–17.7)
MCH: 29.4 pg (ref 26.6–33.0)
MCHC: 34.1 g/dL (ref 31.5–35.7)
MCV: 86 fL (ref 79–97)
Platelets: 211 x10E3/uL (ref 150–379)
RBC: 4.63 x10E6/uL (ref 4.14–5.80)
RDW: 14.8 % (ref 12.3–15.4)
WBC: 8.8 x10E3/uL (ref 3.4–10.8)

## 2017-06-21 NOTE — Telephone Encounter (Signed)
Tried calling pt today to discuss lab result. I got an automated voicemail. Did not leave message. Will have CMA try him later. Pt with mild elevation in PSA (prostate cancer screening level). Will plan to recheck on f/u visit in 3 mths. If still elevated, will refer to urology. Total and LDL cholesterol levels are good. Kidney and LFT normal.   Results for orders placed or performed in visit on 06/19/17  CBC  Result Value Ref Range   WBC 8.8 3.4 - 10.8 x10E3/uL   RBC 4.63 4.14 - 5.80 x10E6/uL   Hemoglobin 13.6 13.0 - 17.7 g/dL   Hematocrit 39.9 37.5 - 51.0 %   MCV 86 79 - 97 fL   MCH 29.4 26.6 - 33.0 pg   MCHC 34.1 31.5 - 35.7 g/dL   RDW 14.8 12.3 - 15.4 %   Platelets 211 150 - 379 x10E3/uL  Comprehensive metabolic panel  Result Value Ref Range   Glucose 142 (H) 65 - 99 mg/dL   BUN 15 8 - 27 mg/dL   Creatinine, Ser 1.25 0.76 - 1.27 mg/dL   GFR calc non Af Amer 60 >59 mL/min/1.73   GFR calc Af Amer 69 >59 mL/min/1.73   BUN/Creatinine Ratio 12 10 - 24   Sodium 143 134 - 144 mmol/L   Potassium 3.9 3.5 - 5.2 mmol/L   Chloride 104 96 - 106 mmol/L   CO2 23 20 - 29 mmol/L   Calcium 9.9 8.6 - 10.2 mg/dL   Total Protein 7.6 6.0 - 8.5 g/dL   Albumin 4.3 3.6 - 4.8 g/dL   Globulin, Total 3.3 1.5 - 4.5 g/dL   Albumin/Globulin Ratio 1.3 1.2 - 2.2   Bilirubin Total 0.3 0.0 - 1.2 mg/dL   Alkaline Phosphatase 68 39 - 117 IU/L   AST 20 0 - 40 IU/L   ALT 18 0 - 44 IU/L  Lipid panel  Result Value Ref Range   Cholesterol, Total 137 100 - 199 mg/dL   Triglycerides 226 (H) 0 - 149 mg/dL   HDL 30 (L) >39 mg/dL   VLDL Cholesterol Cal 45 (H) 5 - 40 mg/dL   LDL Calculated 62 0 - 99 mg/dL   Chol/HDL Ratio 4.6 0.0 - 5.0 ratio  PSA  Result Value Ref Range   Prostate Specific Ag, Serum 5.2 (H) 0.0 - 4.0 ng/mL  POCT glucose (manual entry)  Result Value Ref Range   POC Glucose 152 (A) 70 - 99 mg/dl  POCT glycosylated hemoglobin (Hb A1C)  Result Value Ref Range   Hemoglobin A1C 7.4

## 2017-06-21 NOTE — Telephone Encounter (Signed)
Pt called need a referral for a sport physical therapy for his knee, please follow up

## 2017-06-21 NOTE — Telephone Encounter (Signed)
Contacted pt to go over lab results pt did not answer lvm asking pt to give me a call at his earliest convienence   If pt calls back please give results: mild elevation in PSA (prostate cancer screening level). Will plan to recheck on f/u visit in 3 mths. If still elevated, will refer to urology. Total and LDL cholesterol levels are good. Kidney and LFT normal.

## 2017-06-22 NOTE — Telephone Encounter (Signed)
Will this be for Dr. Wynetta Emery?

## 2017-06-26 MED FILL — $VIAGRA 100 MG TABLET: 100 | 30 days supply | Qty: 10 | Fill #0

## 2017-06-27 NOTE — Telephone Encounter (Signed)
Tried contacting patient to see which knee he is having issues with was unable to get in touch with patient

## 2017-07-12 DIAGNOSIS — S0590XA Unspecified injury of unspecified eye and orbit, initial encounter: Secondary | ICD-10-CM | POA: Diagnosis not present

## 2017-07-12 DIAGNOSIS — H21561 Pupillary abnormality, right eye: Secondary | ICD-10-CM | POA: Diagnosis not present

## 2017-07-12 DIAGNOSIS — H5010 Unspecified exotropia: Secondary | ICD-10-CM | POA: Diagnosis not present

## 2017-07-12 DIAGNOSIS — H31002 Unspecified chorioretinal scars, left eye: Secondary | ICD-10-CM | POA: Diagnosis not present

## 2017-07-12 DIAGNOSIS — H269 Unspecified cataract: Secondary | ICD-10-CM | POA: Diagnosis not present

## 2017-07-26 DIAGNOSIS — H25811 Combined forms of age-related cataract, right eye: Secondary | ICD-10-CM | POA: Diagnosis not present

## 2017-07-26 DIAGNOSIS — H269 Unspecified cataract: Secondary | ICD-10-CM | POA: Diagnosis not present

## 2017-07-26 MED FILL — PREDNISOLONE AC 1% EYE DROP: 1 | 28 days supply | Qty: 5 | Fill #0

## 2017-08-18 ENCOUNTER — Ambulatory Visit (INDEPENDENT_AMBULATORY_CARE_PROVIDER_SITE_OTHER): Payer: Self-pay | Admitting: Orthopaedic Surgery

## 2017-08-18 ENCOUNTER — Ambulatory Visit (INDEPENDENT_AMBULATORY_CARE_PROVIDER_SITE_OTHER): Payer: Medicare HMO

## 2017-08-18 ENCOUNTER — Encounter (INDEPENDENT_AMBULATORY_CARE_PROVIDER_SITE_OTHER): Payer: Self-pay | Admitting: Orthopaedic Surgery

## 2017-08-18 ENCOUNTER — Ambulatory Visit (INDEPENDENT_AMBULATORY_CARE_PROVIDER_SITE_OTHER): Payer: Medicare HMO | Admitting: Orthopaedic Surgery

## 2017-08-18 VITALS — BP 143/90 | HR 104 | Resp 14 | Ht 73.0 in | Wt 215.0 lb

## 2017-08-18 DIAGNOSIS — G8929 Other chronic pain: Secondary | ICD-10-CM

## 2017-08-18 DIAGNOSIS — M25562 Pain in left knee: Secondary | ICD-10-CM

## 2017-08-18 MED ORDER — BUPIVACAINE HCL 0.5 % IJ SOLN
2.0000 mL | INTRAMUSCULAR | Status: AC | PRN
  Administered 2017-08-18: 2 mL via INTRA_ARTICULAR

## 2017-08-18 MED ORDER — METHYLPREDNISOLONE ACETATE 40 MG/ML IJ SUSP
80.0000 mg | INTRAMUSCULAR | Status: AC | PRN
  Administered 2017-08-18: 80 mg

## 2017-08-18 MED ORDER — LIDOCAINE HCL 1 % IJ SOLN
2.0000 mL | INTRAMUSCULAR | Status: AC | PRN
  Administered 2017-08-18: 2 mL
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
## 2017-08-30 MED FILL — $VIAGRA 100 MG TABLET: 100 | 30 days supply | Qty: 10 | Fill #1

## 2017-09-22 ENCOUNTER — Encounter: Payer: Self-pay | Admitting: Internal Medicine

## 2017-09-22 ENCOUNTER — Ambulatory Visit: Payer: Medicare HMO | Attending: Internal Medicine | Admitting: Internal Medicine

## 2017-09-22 VITALS — BP 148/90 | HR 102 | Temp 98.2°F | Resp 16 | Ht 73.0 in | Wt 213.8 lb

## 2017-09-22 DIAGNOSIS — M25562 Pain in left knee: Secondary | ICD-10-CM | POA: Insufficient documentation

## 2017-09-22 DIAGNOSIS — E1165 Type 2 diabetes mellitus with hyperglycemia: Secondary | ICD-10-CM | POA: Diagnosis not present

## 2017-09-22 DIAGNOSIS — K648 Other hemorrhoids: Secondary | ICD-10-CM | POA: Diagnosis not present

## 2017-09-22 DIAGNOSIS — Z79899 Other long term (current) drug therapy: Secondary | ICD-10-CM | POA: Diagnosis not present

## 2017-09-22 DIAGNOSIS — E785 Hyperlipidemia, unspecified: Secondary | ICD-10-CM | POA: Insufficient documentation

## 2017-09-22 DIAGNOSIS — N529 Male erectile dysfunction, unspecified: Secondary | ICD-10-CM | POA: Insufficient documentation

## 2017-09-22 DIAGNOSIS — Z7984 Long term (current) use of oral hypoglycemic drugs: Secondary | ICD-10-CM | POA: Insufficient documentation

## 2017-09-22 DIAGNOSIS — IMO0001 Reserved for inherently not codable concepts without codable children: Secondary | ICD-10-CM

## 2017-09-22 DIAGNOSIS — M1712 Unilateral primary osteoarthritis, left knee: Secondary | ICD-10-CM | POA: Insufficient documentation

## 2017-09-22 DIAGNOSIS — I10 Essential (primary) hypertension: Secondary | ICD-10-CM | POA: Insufficient documentation

## 2017-09-22 DIAGNOSIS — R972 Elevated prostate specific antigen [PSA]: Secondary | ICD-10-CM | POA: Diagnosis not present

## 2017-09-22 DIAGNOSIS — R911 Solitary pulmonary nodule: Secondary | ICD-10-CM | POA: Insufficient documentation

## 2017-09-22 DIAGNOSIS — E559 Vitamin D deficiency, unspecified: Secondary | ICD-10-CM | POA: Insufficient documentation

## 2017-09-22 DIAGNOSIS — Z7982 Long term (current) use of aspirin: Secondary | ICD-10-CM | POA: Diagnosis not present

## 2017-09-22 LAB — GLUCOSE, POCT (MANUAL RESULT ENTRY): POC GLUCOSE: 311 mg/dL — AB (ref 70–99)

## 2017-09-22 LAB — POCT GLYCOSYLATED HEMOGLOBIN (HGB A1C): HEMOGLOBIN A1C: 8.3

## 2017-09-22 MED ORDER — LISINOPRIL 10 MG PO TABS: 15.0000 mg | ORAL_TABLET | Freq: Every day | ORAL | 3 refills | Status: DC

## 2017-09-22 MED ORDER — METFORMIN HCL ER 500 MG PO TB24: ORAL_TABLET | ORAL | 2 refills | Status: DC

## 2017-09-22 NOTE — Patient Instructions (Signed)
Your diabetes is not well controlled.  Increase Metformin to 2 tablets twice a day.  Check your blood sugars 3-4 times a week and bring in readings on next visit.  Your blood pressure is not well controlled.  Increase Lisinopril to 15 mg daily.
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
## 2017-10-27 MED FILL — $VIAGRA 100 MG TABLET: 100 | 30 days supply | Qty: 10 | Fill #2

## 2017-12-04 MED FILL — $VIAGRA 100 MG TABLET: 100 | 30 days supply | Qty: 10 | Fill #3

## 2018-01-04 MED FILL — $VIAGRA 100 MG TABLET: 100 | 30 days supply | Qty: 10 | Fill #4

## 2018-02-01 DIAGNOSIS — R69 Illness, unspecified: Secondary | ICD-10-CM | POA: Diagnosis not present

## 2018-02-05 ENCOUNTER — Encounter: Payer: Self-pay | Admitting: Internal Medicine

## 2018-02-05 ENCOUNTER — Ambulatory Visit: Payer: Medicare HMO | Attending: Internal Medicine | Admitting: Internal Medicine

## 2018-02-05 VITALS — BP 147/91 | HR 84 | Temp 98.3°F | Resp 16 | Wt 216.0 lb

## 2018-02-05 DIAGNOSIS — E1169 Type 2 diabetes mellitus with other specified complication: Secondary | ICD-10-CM

## 2018-02-05 DIAGNOSIS — Z79899 Other long term (current) drug therapy: Secondary | ICD-10-CM | POA: Insufficient documentation

## 2018-02-05 DIAGNOSIS — Z7952 Long term (current) use of systemic steroids: Secondary | ICD-10-CM | POA: Diagnosis not present

## 2018-02-05 DIAGNOSIS — Z7982 Long term (current) use of aspirin: Secondary | ICD-10-CM | POA: Diagnosis not present

## 2018-02-05 DIAGNOSIS — Z7984 Long term (current) use of oral hypoglycemic drugs: Secondary | ICD-10-CM | POA: Insufficient documentation

## 2018-02-05 DIAGNOSIS — Z833 Family history of diabetes mellitus: Secondary | ICD-10-CM | POA: Diagnosis not present

## 2018-02-05 DIAGNOSIS — B351 Tinea unguium: Secondary | ICD-10-CM | POA: Diagnosis not present

## 2018-02-05 DIAGNOSIS — N529 Male erectile dysfunction, unspecified: Secondary | ICD-10-CM | POA: Insufficient documentation

## 2018-02-05 DIAGNOSIS — I10 Essential (primary) hypertension: Secondary | ICD-10-CM | POA: Insufficient documentation

## 2018-02-05 DIAGNOSIS — Z9889 Other specified postprocedural states: Secondary | ICD-10-CM | POA: Diagnosis not present

## 2018-02-05 DIAGNOSIS — E785 Hyperlipidemia, unspecified: Secondary | ICD-10-CM | POA: Diagnosis not present

## 2018-02-05 DIAGNOSIS — R972 Elevated prostate specific antigen [PSA]: Secondary | ICD-10-CM | POA: Diagnosis not present

## 2018-02-05 DIAGNOSIS — E119 Type 2 diabetes mellitus without complications: Secondary | ICD-10-CM | POA: Diagnosis not present

## 2018-02-05 LAB — POCT GLYCOSYLATED HEMOGLOBIN (HGB A1C): HbA1c, POC (controlled diabetic range): 6.7 % (ref 0.0–7.0)

## 2018-02-05 LAB — GLUCOSE, POCT (MANUAL RESULT ENTRY): POC GLUCOSE: 143 mg/dL — AB (ref 70–99)

## 2018-02-05 MED ORDER — CICLOPIROX 8 % EX SOLN
Freq: Every day | CUTANEOUS | 1 refills | Status: DC
Start: 2018-02-05 — End: 2018-09-15

## 2018-02-05 MED ORDER — LISINOPRIL 20 MG PO TABS: 20.0000 mg | ORAL_TABLET | Freq: Every day | ORAL | 1 refills | Status: DC
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
## 2018-02-05 NOTE — Patient Instructions (Addendum)
Increase Lisinopril to 20 mg daily.   Use the Penlac on the toe nail as discussed.

## 2018-02-06 LAB — PSA: Prostate Specific Ag, Serum: 2.7 ng/mL (ref 0.0–4.0)

## 2018-02-07 ENCOUNTER — Telehealth: Payer: Self-pay

## 2018-02-07 NOTE — Telephone Encounter (Signed)
Contacted pt to go over lab results pt didn't answer left a detailed vm informing pt of results and if he has any questions or concerns to give me a call  If pt calls back please give results: his PSA level (screening test for prostate cancer) was normal.

## 2018-02-21 MED FILL — $VIAGRA 100 MG TABLET: 100 | 30 days supply | Qty: 10 | Fill #5

## 2018-04-02 MED FILL — !VIAGRA 100 MG TABLET: 100 MG | 30 days supply | Qty: 10 | Fill #6

## 2018-05-16 MED FILL — !VIAGRA 100 MG TABLET: 100 MG | 30 days supply | Qty: 10 | Fill #7

## 2018-05-30 ENCOUNTER — Emergency Department (HOSPITAL_COMMUNITY): Payer: Medicare HMO

## 2018-05-30 ENCOUNTER — Emergency Department (HOSPITAL_COMMUNITY)
Admission: EM | Admit: 2018-05-30 | Discharge: 2018-05-30 | Disposition: A | Discharge status: Home / Self Care | Payer: Medicare HMO | Attending: Emergency Medicine | Admitting: Emergency Medicine

## 2018-05-30 ENCOUNTER — Encounter (HOSPITAL_COMMUNITY): Payer: Self-pay | Admitting: *Deleted

## 2018-05-30 DIAGNOSIS — M5432 Sciatica, left side: Secondary | ICD-10-CM | POA: Diagnosis not present

## 2018-05-30 DIAGNOSIS — K5730 Diverticulosis of large intestine without perforation or abscess without bleeding: Secondary | ICD-10-CM | POA: Diagnosis not present

## 2018-05-30 DIAGNOSIS — Z79899 Other long term (current) drug therapy: Secondary | ICD-10-CM | POA: Insufficient documentation

## 2018-05-30 DIAGNOSIS — M5416 Radiculopathy, lumbar region: Secondary | ICD-10-CM | POA: Insufficient documentation

## 2018-05-30 DIAGNOSIS — E119 Type 2 diabetes mellitus without complications: Secondary | ICD-10-CM | POA: Insufficient documentation

## 2018-05-30 DIAGNOSIS — I10 Essential (primary) hypertension: Secondary | ICD-10-CM | POA: Insufficient documentation

## 2018-05-30 DIAGNOSIS — Z7982 Long term (current) use of aspirin: Secondary | ICD-10-CM | POA: Insufficient documentation

## 2018-05-30 DIAGNOSIS — M5442 Lumbago with sciatica, left side: Secondary | ICD-10-CM | POA: Diagnosis not present

## 2018-05-30 DIAGNOSIS — Z7984 Long term (current) use of oral hypoglycemic drugs: Secondary | ICD-10-CM | POA: Insufficient documentation

## 2018-05-30 DIAGNOSIS — M549 Dorsalgia, unspecified: Secondary | ICD-10-CM

## 2018-05-30 DIAGNOSIS — M545 Low back pain: Secondary | ICD-10-CM | POA: Diagnosis not present

## 2018-05-30 LAB — URINALYSIS, ROUTINE W REFLEX MICROSCOPIC
Bilirubin Urine: NEGATIVE
GLUCOSE, UA: NEGATIVE mg/dL
Hgb urine dipstick: NEGATIVE
Ketones, ur: NEGATIVE mg/dL
LEUKOCYTES UA: NEGATIVE
Nitrite: NEGATIVE
PH: 5 (ref 5.0–8.0)
PROTEIN: NEGATIVE mg/dL
Specific Gravity, Urine: 1.013 (ref 1.005–1.030)

## 2018-05-30 LAB — CBC WITH DIFFERENTIAL/PLATELET
ABS IMMATURE GRANULOCYTES: 0 10*3/uL (ref 0.0–0.1)
Basophils Absolute: 0.1 10*3/uL (ref 0.0–0.1)
Basophils Relative: 1 %
Eosinophils Absolute: 0.3 10*3/uL (ref 0.0–0.7)
Eosinophils Relative: 5 %
HEMATOCRIT: 44.1 % (ref 39.0–52.0)
Hemoglobin: 14.6 g/dL (ref 13.0–17.0)
IMMATURE GRANULOCYTES: 0 %
LYMPHS PCT: 14 %
Lymphs Abs: 0.9 10*3/uL (ref 0.7–4.0)
MCH: 29.9 pg (ref 26.0–34.0)
MCHC: 33.1 g/dL (ref 30.0–36.0)
MCV: 90.4 fL (ref 78.0–100.0)
MONOS PCT: 7 %
Monocytes Absolute: 0.5 10*3/uL (ref 0.1–1.0)
NEUTROS ABS: 4.4 10*3/uL (ref 1.7–7.7)
NEUTROS PCT: 73 %
PLATELETS: 224 10*3/uL (ref 150–400)
RBC: 4.88 MIL/uL (ref 4.22–5.81)
RDW: 12.3 % (ref 11.5–15.5)
WBC: 6.1 10*3/uL (ref 4.0–10.5)

## 2018-05-30 LAB — COMPREHENSIVE METABOLIC PANEL
ALT: 33 U/L (ref 0–44)
ANION GAP: 10 (ref 5–15)
AST: 43 U/L — ABNORMAL HIGH (ref 15–41)
Albumin: 3.7 g/dL (ref 3.5–5.0)
Alkaline Phosphatase: 48 U/L (ref 38–126)
BUN: 13 mg/dL (ref 8–23)
CHLORIDE: 103 mmol/L (ref 98–111)
CO2: 23 mmol/L (ref 22–32)
Calcium: 9.6 mg/dL (ref 8.9–10.3)
Creatinine, Ser: 1.19 mg/dL (ref 0.61–1.24)
GFR calc non Af Amer: >60 mL/min (ref >60)
Glucose, Bld: 120 mg/dL — ABNORMAL HIGH (ref 70–99)
Potassium: 4.1 mmol/L (ref 3.5–5.1)
SODIUM: 136 mmol/L (ref 135–145)
Total Bilirubin: 1.2 mg/dL (ref 0.3–1.2)
Total Protein: 7.2 g/dL (ref 6.5–8.1)

## 2018-05-30 MED ORDER — SODIUM CHLORIDE 0.9 % IV BOLUS
1000.0000 mL | Freq: Once | INTRAVENOUS | Status: AC
  Administered 2018-05-30: 1000 mL via INTRAVENOUS

## 2018-05-30 MED ORDER — IBUPROFEN 600 MG PO TABS: 600.0000 mg | ORAL_TABLET | Freq: Four times a day (QID) | ORAL | 0 refills | Status: DC | PRN

## 2018-05-30 MED ORDER — METHYLPREDNISOLONE 4 MG PO TBPK: ORAL_TABLET | ORAL | 0 refills | Status: DC

## 2018-05-30 MED ORDER — ONDANSETRON HCL 4 MG/2ML IJ SOLN
4.0000 mg | Freq: Once | INTRAMUSCULAR | Status: AC
  Administered 2018-05-30: 4 mg via INTRAVENOUS
  Filled 2018-05-30: qty 2

## 2018-05-30 MED ORDER — MORPHINE SULFATE (PF) 4 MG/ML IV SOLN
4.0000 mg | Freq: Once | INTRAVENOUS | Status: AC
  Administered 2018-05-30: 4 mg via INTRAVENOUS
  Filled 2018-05-30: qty 1

## 2018-05-30 MED ORDER — CYCLOBENZAPRINE HCL 5 MG PO TABS: 5.0000 mg | ORAL_TABLET | Freq: Three times a day (TID) | ORAL | 0 refills | Status: DC | PRN

## 2018-05-30 MED ORDER — HYDROCODONE-ACETAMINOPHEN 5-325 MG PO TABS: 1.0000 | ORAL_TABLET | Freq: Four times a day (QID) | ORAL | 0 refills | Status: DC | PRN
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
## 2018-05-30 NOTE — Discharge Instructions (Addendum)
Take motrin for pain.   Take flexeril for muscle spasms   Take medrol dose pack as prescribed.   Take vicodin for severe pain   See your doctor. Consider following up with spine doctor  Return to ER if you have worse back pain, numbness, weakness, trouble walking

## 2018-05-30 NOTE — ED Triage Notes (Signed)
Pt in c/o lower back pain that started Sunday night, worse with movement and standing, no distress noted, denies injury

## 2018-06-04 DIAGNOSIS — M545 Low back pain: Secondary | ICD-10-CM | POA: Diagnosis not present

## 2018-06-08 ENCOUNTER — Ambulatory Visit: Payer: Medicare HMO | Admitting: Internal Medicine

## 2018-06-25 ENCOUNTER — Other Ambulatory Visit: Payer: Self-pay

## 2018-06-25 DIAGNOSIS — N529 Male erectile dysfunction, unspecified: Secondary | ICD-10-CM

## 2018-06-25 MED ORDER — SILDENAFIL CITRATE 100 MG PO TABS: 50.0000 mg | ORAL_TABLET | Freq: Every day | ORAL | 2 refills | Status: DC | PRN

## 2018-06-26 MED FILL — SILDENAFIL CITRATE 100 MG T: 100 | 30 days supply | Qty: 10 | Fill #0

## 2018-07-06 ENCOUNTER — Encounter: Payer: Self-pay | Admitting: Internal Medicine

## 2018-07-06 ENCOUNTER — Ambulatory Visit: Payer: Medicare HMO | Attending: Internal Medicine | Admitting: Internal Medicine

## 2018-07-06 VITALS — BP 168/111 | HR 108 | Temp 98.5°F | Resp 16 | Wt 202.0 lb

## 2018-07-06 DIAGNOSIS — E119 Type 2 diabetes mellitus without complications: Secondary | ICD-10-CM | POA: Diagnosis not present

## 2018-07-06 DIAGNOSIS — M1712 Unilateral primary osteoarthritis, left knee: Secondary | ICD-10-CM | POA: Insufficient documentation

## 2018-07-06 DIAGNOSIS — Z7982 Long term (current) use of aspirin: Secondary | ICD-10-CM | POA: Insufficient documentation

## 2018-07-06 DIAGNOSIS — Z79899 Other long term (current) drug therapy: Secondary | ICD-10-CM | POA: Insufficient documentation

## 2018-07-06 DIAGNOSIS — Z7984 Long term (current) use of oral hypoglycemic drugs: Secondary | ICD-10-CM | POA: Diagnosis not present

## 2018-07-06 DIAGNOSIS — Z23 Encounter for immunization: Secondary | ICD-10-CM | POA: Diagnosis not present

## 2018-07-06 DIAGNOSIS — E785 Hyperlipidemia, unspecified: Secondary | ICD-10-CM | POA: Diagnosis not present

## 2018-07-06 DIAGNOSIS — E559 Vitamin D deficiency, unspecified: Secondary | ICD-10-CM | POA: Insufficient documentation

## 2018-07-06 DIAGNOSIS — I10 Essential (primary) hypertension: Secondary | ICD-10-CM

## 2018-07-06 DIAGNOSIS — N529 Male erectile dysfunction, unspecified: Secondary | ICD-10-CM | POA: Diagnosis not present

## 2018-07-06 DIAGNOSIS — F1010 Alcohol abuse, uncomplicated: Secondary | ICD-10-CM

## 2018-07-06 LAB — POCT GLYCOSYLATED HEMOGLOBIN (HGB A1C): HbA1c, POC (prediabetic range): 6.2 % (ref 5.7–6.4)

## 2018-07-06 LAB — GLUCOSE, POCT (MANUAL RESULT ENTRY): POC Glucose: 129 mg/dl — AB (ref 70–99)

## 2018-07-06 MED ORDER — LISINOPRIL 20 MG PO TABS: 20.0000 mg | ORAL_TABLET | Freq: Every day | ORAL | 2 refills | Status: DC

## 2018-07-06 MED ORDER — ATORVASTATIN CALCIUM 40 MG PO TABS: 40.0000 mg | ORAL_TABLET | Freq: Every day | ORAL | 2 refills | Status: DC

## 2018-07-06 MED ORDER — SILDENAFIL CITRATE 100 MG PO TABS: ORAL_TABLET | ORAL | 2 refills | Status: DC

## 2018-07-06 MED ORDER — AMLODIPINE BESYLATE 10 MG PO TABS: 10.0000 mg | ORAL_TABLET | Freq: Every day | ORAL | 2 refills | Status: DC

## 2018-07-06 MED ORDER — METFORMIN HCL ER 500 MG PO TB24: ORAL_TABLET | ORAL | 2 refills | Status: DC

## 2018-07-06 MED ORDER — PNEUMOCOCCAL VAC POLYVALENT 25 MCG/0.5ML IJ INJ: 0.5000 mL | INJECTION | INTRAMUSCULAR | 0 refills | Status: AC

## 2018-07-06 MED FILL — PNEUMOVAX 23 VIAL: 25 | 1 days supply | Qty: 1 | Fill #0
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
## 2018-07-06 NOTE — Patient Instructions (Addendum)
2 week bp check with Logan Knight  Influenza Virus Vaccine injection (Fluarix) What is this medicine? INFLUENZA VIRUS VACCINE (in floo EN zuh VAHY ruhs vak SEEN) helps to reduce the risk of getting influenza also known as the flu. This medicine may be used for other purposes; ask your health care provider or pharmacist if you have questions. COMMON BRAND NAME(S): Fluarix, Fluzone What should I tell my health care provider before I take this medicine? They need to know if you have any of these conditions: -bleeding disorder like hemophilia -fever or infection -Guillain-Barre syndrome or other neurological problems -immune system problems -infection with the human immunodeficiency virus (HIV) or AIDS -low blood platelet counts -multiple sclerosis -an unusual or allergic reaction to influenza virus vaccine, eggs, chicken proteins, latex, gentamicin, other medicines, foods, dyes or preservatives -pregnant or trying to get pregnant -breast-feeding How should I use this medicine? This vaccine is for injection into a muscle. It is given by a health care professional. A copy of Vaccine Information Statements will be given before each vaccination. Read this sheet carefully each time. The sheet may change frequently. Talk to your pediatrician regarding the use of this medicine in children. Special care may be needed. Overdosage: If you think you have taken too much of this medicine contact a poison control center or emergency room at once. NOTE: This medicine is only for you. Do not share this medicine with others. What if I miss a dose? This does not apply. What may interact with this medicine? -chemotherapy or radiation therapy -medicines that lower your immune system like etanercept, anakinra, infliximab, and adalimumab -medicines that treat or prevent blood clots like warfarin -phenytoin -steroid medicines like prednisone or cortisone -theophylline -vaccines This list may not describe all  possible interactions. Give your health care provider a list of all the medicines, herbs, non-prescription drugs, or dietary supplements you use. Also tell them if you smoke, drink alcohol, or use illegal drugs. Some items may interact with your medicine. What should I watch for while using this medicine? Report any side effects that do not go away within 3 days to your doctor or health care professional. Call your health care provider if any unusual symptoms occur within 6 weeks of receiving this vaccine. You may still catch the flu, but the illness is not usually as bad. You cannot get the flu from the vaccine. The vaccine will not protect against colds or other illnesses that may cause fever. The vaccine is needed every year. What side effects may I notice from receiving this medicine? Side effects that you should report to your doctor or health care professional as soon as possible: -allergic reactions like skin rash, itching or hives, swelling of the face, lips, or tongue Side effects that usually do not require medical attention (report to your doctor or health care professional if they continue or are bothersome): -fever -headache -muscle aches and pains -pain, tenderness, redness, or swelling at site where injected -weak or tired This list may not describe all possible side effects. Call your doctor for medical advice about side effects. You may report side effects to FDA at 1-800-FDA-1088. Where should I keep my medicine? This vaccine is only given in a clinic, pharmacy, doctor's office, or other health care setting and will not be stored at home. NOTE: This sheet is a summary. It may not cover all possible information. If you have questions about this medicine, talk to your doctor, pharmacist, or health care provider.  2018 Elsevier/Gold Standard (  2008-03-12 09:30:40)  Pneumococcal Polysaccharide Vaccine: What You Need to Know 1. Why get vaccinated? Vaccination can protect older  adults (and some children and younger adults) from pneumococcal disease. Pneumococcal disease is caused by bacteria that can spread from person to person through close contact. It can cause ear infections, and it can also lead to more serious infections of the:  Lungs (pneumonia),  Blood (bacteremia), and  Covering of the brain and spinal cord (meningitis). Meningitis can cause deafness and brain damage, and it can be fatal.  Anyone can get pneumococcal disease, but children under 61 years of age, people with certain medical conditions, adults over 23 years of age, and cigarette smokers are at the highest risk. About 18,000 older adults die each year from pneumococcal disease in the [place]. Treatment of pneumococcal infections with penicillin and other drugs used to be more effective. But some strains of the disease have become resistant to these drugs. This makes prevention of the disease, through vaccination, even more important. 2. Pneumococcal polysaccharide vaccine (PPSV23) Pneumococcal polysaccharide vaccine (PPSV23) protects against 23 types of pneumococcal bacteria. It will not prevent all pneumococcal disease. PPSV23 is recommended for:  All adults 29 years of age and older,  Anyone 2 through 67 years of age with certain long-term health problems,  Anyone 2 through 67 years of age with a weakened immune system,  Adults 22 through 67 years of age who smoke cigarettes or have asthma.  Most people need only one dose of PPSV. A second dose is recommended for certain high-risk groups. People 53 and older should get a dose even if they have gotten one or more doses of the vaccine before they turned 65. Your healthcare provider can give you more information about these recommendations. Most healthy adults develop protection within 2 to 3 weeks of getting the shot. 3. Some people should not get this vaccine  Anyone who has had a life-threatening allergic reaction to PPSV should  not get another dose.  Anyone who has a severe allergy to any component of PPSV should not receive it. Tell your provider if you have any severe allergies.  Anyone who is moderately or severely ill when the shot is scheduled may be asked to wait until they recover before getting the vaccine. Someone with a mild illness can usually be vaccinated.  Children less than 50 years of age should not receive this vaccine.  There is no evidence that PPSV is harmful to either a pregnant woman or to her fetus. However, as a precaution, women who need the vaccine should be vaccinated before becoming pregnant, if possible. 4. Risks of a vaccine reaction With any medicine, including vaccines, there is a chance of side effects. These are usually mild and go away on their own, but serious reactions are also possible. About half of people who get PPSV have mild side effects, such as redness or pain where the shot is given, which go away within about two days. Less than 1 out of 100 people develop a fever, muscle aches, or more severe local reactions. Problems that could happen after any vaccine:  People sometimes faint after a medical procedure, including vaccination. Sitting or lying down for about 15 minutes can help prevent fainting, and injuries caused by a fall. Tell your doctor if you feel dizzy, or have vision changes or ringing in the ears.  Some people get severe pain in the shoulder and have difficulty moving the arm where a shot was given. This happens  very rarely.  Any medication can cause a severe allergic reaction. Such reactions from a vaccine are very rare, estimated at about 1 in a million doses, and would happen within a few minutes to a few hours after the vaccination. As with any medicine, there is a very remote chance of a vaccine causing a serious injury or death. The safety of vaccines is always being monitored. For more information, visit: http://www.aguilar.org/ 5. What if there is a  serious reaction? What should I look for? Look for anything that concerns you, such as signs of a severe allergic reaction, very high fever, or unusual behavior. Signs of a severe allergic reaction can include hives, swelling of the face and throat, difficulty breathing, a fast heartbeat, dizziness, and weakness. These would usually start a few minutes to a few hours after the vaccination. What should I do? If you think it is a severe allergic reaction or other emergency that can't wait, call 9-1-1 or get to the nearest hospital. Otherwise, call your doctor. Afterward, the reaction should be reported to the Vaccine Adverse Event Reporting System (VAERS). Your doctor might file this report, or you can do it yourself through the VAERS web site at www.vaers.SamedayNews.es, or by calling (601)188-7754. VAERS does not give medical advice. 6. How can I learn more?  Ask your doctor. He or she can give you the vaccine package insert or suggest other sources of information.  Call your local or state health department.  Contact the Centers for Disease Control and Prevention (CDC): ? Call 2701917328 (1-800-CDC-INFO) or ? Visit CDC's website at http://hunter.com/ CDC Pneumococcal Polysaccharide Vaccine VIS (12/20/13) This information is not intended to replace advice given to you by your health care provider. Make sure you discuss any questions you have with your health care provider. Document Released: 06/12/2006 Document Revised: 05/05/2016 Document Reviewed: 05/05/2016 Elsevier Interactive Patient Education  2017 [employer].

## 2018-07-07 LAB — MICROALBUMIN / CREATININE URINE RATIO
CREATININE, UR: 88.9 mg/dL
Microalb/Creat Ratio: 194.5 mg/g creat — ABNORMAL HIGH (ref 0.0–30.0)
Microalbumin, Urine: 172.9 ug/mL

## 2018-07-13 ENCOUNTER — Telehealth: Payer: Self-pay

## 2018-07-13 NOTE — Telephone Encounter (Signed)
Contacted pt to go over urine results pt is aware and doesn't have any questions or concerns

## 2018-07-20 ENCOUNTER — Encounter: Payer: Self-pay | Admitting: Pharmacist

## 2018-07-20 ENCOUNTER — Ambulatory Visit: Payer: Medicare HMO | Attending: Internal Medicine | Admitting: Pharmacist

## 2018-07-20 DIAGNOSIS — I10 Essential (primary) hypertension: Secondary | ICD-10-CM

## 2018-07-20 NOTE — Patient Instructions (Signed)
Thank you for coming to see [place] today.   Blood pressure today is well-controlled.   Continue taking amlodipine and lisinopril. You take 1 pill of each every day.   Limiting salt and caffeine, as well as exercising as able for at least 30 minutes for 5 days out of the week, can also help you lower your blood pressure.  Take your blood pressure at home if you are able. Please write down these numbers and bring them to your visits.  If you have any questions about medications, please call me [phone].  Lurena Joiner

## 2018-07-20 NOTE — Progress Notes (Signed)
   S:    PCP: Dr. Wynetta Emery  Patient arrives in good spirits. Presents to the clinic for hypertension management. Patient was referred by Dr. Wynetta Emery on 07/06/18. BP elevated at that visit (163/105). Of note, pt reported increase in depressive symptoms d/t recent loss of his nephew. No changes made to medications at that time.  Patient reports adherence with medications.  Current BP Medications include:   - Amlodipine 10 mg daily - Lisinopril 20 mg daily  Antihypertensives tried in the past include: amlodipine 5 mg, lisinopril 5 & 10 mg daily, lisinopril-HCTZ 20-25, metoprolol 12.5 & 25 mg BID  Dietary habits include: limits salt, 1 cup of coffee in the morning Exercise habits include:rides a bike 3-4x/week Family / Social history: never smoker, drinks 1 beer every couple of days  Home BP readings: does not take at home  O:  L arm after 5 minutes rest: 129/80, HR 85 Last 3 Office BP readings: BP Readings from Last 3 Encounters:  07/06/18 (!) 168/111  05/30/18 (!) 140/102  02/05/18 (!) 147/91    BMET    Component Value Date/Time   NA 136 05/30/2018 0808   NA 143 06/20/2017 0844   K 4.1 05/30/2018 0808   CL 103 05/30/2018 0808   CO2 23 05/30/2018 0808   GLUCOSE 120 (H) 05/30/2018 0808   BUN 13 05/30/2018 0808   BUN 15 06/20/2017 0844   CREATININE 1.19 05/30/2018 0808   CREATININE 1.29 (H) 06/09/2016 1638   CALCIUM 9.6 05/30/2018 0808   GFRNONAA >60 05/30/2018 0808   GFRNONAA 58 (L) 06/09/2016 1638   GFRAA >60 05/30/2018 0808   GFRAA 67 06/09/2016 1638    Renal function: CrCl cannot be calculated (Patient's most recent lab result is older than the maximum 21 days allowed.).  A/P: Hypertension longstanding currently controlled on current medications. BP Goal <130/80 mmHg. Patient is adherent with current medications.  -Continued lisinopril and amlodipine.  -F/u labs ordered - lipid -HM: UTD on PNA, influenza, and tetanus vaccines -Counseled on lifestyle modifications  for blood pressure control including reduced dietary sodium, increased exercise, adequate sleep  Results reviewed and written information provided.  Total time in face-to-face counseling 15 minutes.   F/U Clinic Visit in 10/12/2018.    Benard Halsted, PharmD, [place] [phone]

## 2018-07-21 LAB — LIPID PANEL
CHOL/HDL RATIO: 3.6 ratio (ref 0.0–5.0)
Cholesterol, Total: 133 mg/dL (ref 100–199)
HDL: 37 mg/dL — ABNORMAL LOW (ref >39)
LDL CALC: 52 mg/dL (ref 0–99)
Triglycerides: 221 mg/dL — ABNORMAL HIGH (ref 0–149)
VLDL Cholesterol Cal: 44 mg/dL — ABNORMAL HIGH (ref 5–40)

## 2018-07-25 ENCOUNTER — Telehealth: Payer: Self-pay

## 2018-07-25 NOTE — Telephone Encounter (Signed)
Contacted pt to go over lab pt is aware and doesn't have any questions or concerns

## 2018-08-01 MED FILL — SILDENAFIL CITRATE 100 MG T: 100 | 30 days supply | Qty: 10 | Fill #1

## 2018-09-13 ENCOUNTER — Emergency Department (HOSPITAL_COMMUNITY): Payer: Medicare HMO

## 2018-09-13 ENCOUNTER — Inpatient Hospital Stay (HOSPITAL_COMMUNITY): Payer: Medicare HMO

## 2018-09-13 ENCOUNTER — Encounter (HOSPITAL_COMMUNITY): Payer: Self-pay | Admitting: General Surgery

## 2018-09-13 ENCOUNTER — Other Ambulatory Visit: Payer: Self-pay

## 2018-09-13 ENCOUNTER — Other Ambulatory Visit (HOSPITAL_COMMUNITY): Payer: Self-pay

## 2018-09-13 ENCOUNTER — Inpatient Hospital Stay (HOSPITAL_COMMUNITY)
Admission: EM | Admit: 2018-09-13 | Discharge: 2018-09-15 | DRG: 390 | Disposition: A | Discharge status: Home / Self Care | Payer: Medicare HMO | Attending: Oncology | Admitting: Oncology

## 2018-09-13 DIAGNOSIS — Z7984 Long term (current) use of oral hypoglycemic drugs: Secondary | ICD-10-CM | POA: Diagnosis not present

## 2018-09-13 DIAGNOSIS — Z86010 Personal history of colonic polyps: Secondary | ICD-10-CM | POA: Diagnosis not present

## 2018-09-13 DIAGNOSIS — F1010 Alcohol abuse, uncomplicated: Secondary | ICD-10-CM | POA: Diagnosis present

## 2018-09-13 DIAGNOSIS — E782 Mixed hyperlipidemia: Secondary | ICD-10-CM | POA: Diagnosis present

## 2018-09-13 DIAGNOSIS — Z4682 Encounter for fitting and adjustment of non-vascular catheter: Secondary | ICD-10-CM | POA: Diagnosis not present

## 2018-09-13 DIAGNOSIS — Z9049 Acquired absence of other specified parts of digestive tract: Secondary | ICD-10-CM | POA: Diagnosis not present

## 2018-09-13 DIAGNOSIS — K56600 Partial intestinal obstruction, unspecified as to cause: Secondary | ICD-10-CM | POA: Diagnosis not present

## 2018-09-13 DIAGNOSIS — K5730 Diverticulosis of large intestine without perforation or abscess without bleeding: Secondary | ICD-10-CM | POA: Diagnosis not present

## 2018-09-13 DIAGNOSIS — Z79899 Other long term (current) drug therapy: Secondary | ICD-10-CM

## 2018-09-13 DIAGNOSIS — K8020 Calculus of gallbladder without cholecystitis without obstruction: Secondary | ICD-10-CM | POA: Diagnosis not present

## 2018-09-13 DIAGNOSIS — E785 Hyperlipidemia, unspecified: Secondary | ICD-10-CM | POA: Diagnosis not present

## 2018-09-13 DIAGNOSIS — R109 Unspecified abdominal pain: Secondary | ICD-10-CM | POA: Diagnosis present

## 2018-09-13 DIAGNOSIS — R1084 Generalized abdominal pain: Secondary | ICD-10-CM | POA: Diagnosis not present

## 2018-09-13 DIAGNOSIS — R000 Tachycardia, unspecified: Secondary | ICD-10-CM | POA: Diagnosis not present

## 2018-09-13 DIAGNOSIS — K56609 Unspecified intestinal obstruction, unspecified as to partial versus complete obstruction: Secondary | ICD-10-CM | POA: Diagnosis not present

## 2018-09-13 DIAGNOSIS — E1169 Type 2 diabetes mellitus with other specified complication: Secondary | ICD-10-CM | POA: Diagnosis not present

## 2018-09-13 DIAGNOSIS — Z87828 Personal history of other (healed) physical injury and trauma: Secondary | ICD-10-CM | POA: Diagnosis not present

## 2018-09-13 DIAGNOSIS — I10 Essential (primary) hypertension: Secondary | ICD-10-CM | POA: Diagnosis present

## 2018-09-13 DIAGNOSIS — E1129 Type 2 diabetes mellitus with other diabetic kidney complication: Secondary | ICD-10-CM

## 2018-09-13 DIAGNOSIS — Z833 Family history of diabetes mellitus: Secondary | ICD-10-CM

## 2018-09-13 DIAGNOSIS — E119 Type 2 diabetes mellitus without complications: Secondary | ICD-10-CM

## 2018-09-13 DIAGNOSIS — N400 Enlarged prostate without lower urinary tract symptoms: Secondary | ICD-10-CM | POA: Diagnosis present

## 2018-09-13 DIAGNOSIS — Z8719 Personal history of other diseases of the digestive system: Secondary | ICD-10-CM | POA: Diagnosis not present

## 2018-09-13 DIAGNOSIS — R1013 Epigastric pain: Secondary | ICD-10-CM | POA: Diagnosis not present

## 2018-09-13 DIAGNOSIS — Z7982 Long term (current) use of aspirin: Secondary | ICD-10-CM

## 2018-09-13 DIAGNOSIS — I700 Atherosclerosis of aorta: Secondary | ICD-10-CM | POA: Diagnosis present

## 2018-09-13 DIAGNOSIS — Z0189 Encounter for other specified special examinations: Secondary | ICD-10-CM

## 2018-09-13 HISTORY — DX: Personal history of other medical treatment: Z92.89

## 2018-09-13 HISTORY — DX: Unspecified intestinal obstruction, unspecified as to partial versus complete obstruction: K56.609

## 2018-09-13 HISTORY — DX: Unspecified osteoarthritis, unspecified site: M19.90

## 2018-09-13 LAB — COMPREHENSIVE METABOLIC PANEL
ALK PHOS: 44 U/L (ref 38–126)
ALT: 20 U/L (ref 0–44)
ANION GAP: 11 (ref 5–15)
AST: 28 U/L (ref 15–41)
Albumin: 3.7 g/dL (ref 3.5–5.0)
BILIRUBIN TOTAL: 1.1 mg/dL (ref 0.3–1.2)
BUN: 7 mg/dL — ABNORMAL LOW (ref 8–23)
CALCIUM: 9.2 mg/dL (ref 8.9–10.3)
CO2: 23 mmol/L (ref 22–32)
Chloride: 102 mmol/L (ref 98–111)
Creatinine, Ser: 1.19 mg/dL (ref 0.61–1.24)
GFR calc non Af Amer: >60 mL/min (ref >60)
Glucose, Bld: 141 mg/dL — ABNORMAL HIGH (ref 70–99)
Potassium: 3.9 mmol/L (ref 3.5–5.1)
Sodium: 136 mmol/L (ref 135–145)
TOTAL PROTEIN: 7.3 g/dL (ref 6.5–8.1)

## 2018-09-13 LAB — CBC
HCT: 44.4 % (ref 39.0–52.0)
Hemoglobin: 14.6 g/dL (ref 13.0–17.0)
MCH: 29.6 pg (ref 26.0–34.0)
MCHC: 32.9 g/dL (ref 30.0–36.0)
MCV: 90.1 fL (ref 80.0–100.0)
NRBC: 0 % (ref 0.0–0.2)
PLATELETS: 213 10*3/uL (ref 150–400)
RBC: 4.93 MIL/uL (ref 4.22–5.81)
RDW: 13 % (ref 11.5–15.5)
WBC: 11.5 10*3/uL — AB (ref 4.0–10.5)

## 2018-09-13 LAB — URINALYSIS, ROUTINE W REFLEX MICROSCOPIC
BILIRUBIN URINE: NEGATIVE
Bacteria, UA: NONE SEEN
GLUCOSE, UA: NEGATIVE mg/dL
Hgb urine dipstick: NEGATIVE
KETONES UR: NEGATIVE mg/dL
LEUKOCYTES UA: NEGATIVE
Nitrite: NEGATIVE
PH: 6 (ref 5.0–8.0)
Protein, ur: 30 mg/dL — AB
Specific Gravity, Urine: 1.032 — ABNORMAL HIGH (ref 1.005–1.030)

## 2018-09-13 LAB — I-STAT TROPONIN, ED: Troponin i, poc: 0 ng/mL (ref 0.00–0.08)

## 2018-09-13 LAB — HEMOGLOBIN A1C
Hgb A1c MFr Bld: 6.5 % — ABNORMAL HIGH (ref 4.8–5.6)
Mean Plasma Glucose: 139.85 mg/dL

## 2018-09-13 LAB — GLUCOSE, CAPILLARY
GLUCOSE-CAPILLARY: 96 mg/dL (ref 70–99)
Glucose-Capillary: 123 mg/dL — ABNORMAL HIGH (ref 70–99)

## 2018-09-13 LAB — LIPASE, BLOOD: Lipase: 26 U/L (ref 11–51)

## 2018-09-13 MED ORDER — SODIUM CHLORIDE 0.9% FLUSH
3.0000 mL | Freq: Once | INTRAVENOUS | Status: AC
  Administered 2018-09-13: 3 mL via INTRAVENOUS

## 2018-09-13 MED ORDER — ACETAMINOPHEN 650 MG RE SUPP: 650.0000 mg | Freq: Four times a day (QID) | RECTAL | Status: DC | PRN

## 2018-09-13 MED ORDER — HYDROMORPHONE HCL 1 MG/ML IJ SOLN
1.0000 mg | Freq: Once | INTRAMUSCULAR | Status: AC
  Administered 2018-09-13: 1 mg via INTRAVENOUS
  Filled 2018-09-13: qty 1

## 2018-09-13 MED ORDER — INSULIN ASPART 100 UNIT/ML ~~LOC~~ SOLN
0.0000 [IU] | Freq: Three times a day (TID) | SUBCUTANEOUS | Status: DC
  Administered 2018-09-14: 2 [IU] via SUBCUTANEOUS

## 2018-09-13 MED ORDER — ACETAMINOPHEN 325 MG PO TABS
650.0000 mg | ORAL_TABLET | Freq: Four times a day (QID) | ORAL | Status: DC | PRN
  Administered 2018-09-14: 650 mg via ORAL
  Filled 2018-09-13: qty 2

## 2018-09-13 MED ORDER — SENNOSIDES-DOCUSATE SODIUM 8.6-50 MG PO TABS: 1.0000 | ORAL_TABLET | Freq: Every evening | ORAL | Status: DC | PRN

## 2018-09-13 MED ORDER — ONDANSETRON HCL 4 MG/2ML IJ SOLN: 4.0000 mg | Freq: Four times a day (QID) | INTRAMUSCULAR | Status: DC | PRN

## 2018-09-13 MED ORDER — LORAZEPAM 2 MG/ML IJ SOLN
1.0000 mg | Freq: Once | INTRAMUSCULAR | Status: AC
  Administered 2018-09-13: 1 mg via INTRAVENOUS

## 2018-09-13 MED ORDER — DIATRIZOATE MEGLUMINE & SODIUM 66-10 % PO SOLN
90.0000 mL | Freq: Once | ORAL | Status: AC
  Administered 2018-09-13: 90 mL via NASOGASTRIC
  Filled 2018-09-13 (×3): qty 90

## 2018-09-13 MED ORDER — ONDANSETRON HCL 4 MG/2ML IJ SOLN
4.0000 mg | Freq: Once | INTRAMUSCULAR | Status: AC
  Administered 2018-09-13: 4 mg via INTRAVENOUS
  Filled 2018-09-13: qty 2

## 2018-09-13 MED ORDER — IOHEXOL 300 MG/ML  SOLN
100.0000 mL | Freq: Once | INTRAMUSCULAR | Status: AC | PRN
  Administered 2018-09-13: 100 mL via INTRAVENOUS

## 2018-09-13 MED ORDER — KETOROLAC TROMETHAMINE 15 MG/ML IJ SOLN
15.0000 mg | Freq: Four times a day (QID) | INTRAMUSCULAR | Status: DC | PRN
  Administered 2018-09-13 – 2018-09-14 (×2): 15 mg via INTRAVENOUS
  Filled 2018-09-13 (×3): qty 1

## 2018-09-13 MED ORDER — ENOXAPARIN SODIUM 40 MG/0.4ML ~~LOC~~ SOLN
40.0000 mg | SUBCUTANEOUS | Status: DC
  Administered 2018-09-14 – 2018-09-15 (×2): 40 mg via SUBCUTANEOUS
  Filled 2018-09-13 (×3): qty 0.4

## 2018-09-13 MED ORDER — LABETALOL HCL 5 MG/ML IV SOLN
5.0000 mg | INTRAVENOUS | Status: DC | PRN
  Filled 2018-09-13: qty 4

## 2018-09-13 MED ORDER — LACTATED RINGERS IV SOLN
INTRAVENOUS | Status: AC
  Administered 2018-09-13: 17:00:00 via INTRAVENOUS

## 2018-09-13 MED ORDER — LORAZEPAM 2 MG/ML IJ SOLN
1.0000 mg | Freq: Once | INTRAMUSCULAR | Status: AC
  Administered 2018-09-13: 1 mg via INTRAVENOUS
  Filled 2018-09-13: qty 1
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
## 2018-09-13 NOTE — ED Provider Notes (Signed)
Medical screening examination/treatment/procedure(s) were conducted as a shared visit with non-physician practitioner(s) and myself.  I personally evaluated the patient during the encounter.  EKG Interpretation  Date/Time:  Thursday September 13 2018 03:41:00 EST Ventricular Rate:  107 PR Interval:  184 QRS Duration: 92 QT Interval:  356 QTC Calculation: 475 R Axis:   39 Text Interpretation:  Sinus tachycardia Otherwise normal ECG similar to previous  Confirmed by Ezequiel Essex [phone]) on 09/13/2018 6:34:16 AM Also confirmed by Ezequiel Essex [phone]), editor Philomena Doheny [phone])  on 09/13/2018 8:00:12 AM Patient had gradual onset of abdominal pain yesterday evening while watching television.  He reports over 6-hour.  It got significantly worse.  It started centrally.  He denies any vomiting or diarrhea.  He reports he had a normal bowel movement this morning and got no improvement or relief of symptoms.  He does have history of prior bowel obstructions.  Patient is alert and appropriate.  No respiratory distress clear mental status.  Heart regular.  Abdomen is soft but moderate to severe tenderness in the central and right lateral abdomen.  No peripheral edema.  Movements coordinated purposeful with clear mental status.  CT identifies bowel obstruction.  I agree with plan of management.   Charlesetta Shanks, MD 09/13/18 [phone]

## 2018-09-13 NOTE — ED Triage Notes (Signed)
BIB EMS from home. Pt reports epigastric pain since 2100. Tender with palpation, 8/10. Denies N/V. Denies CP.

## 2018-09-13 NOTE — ED Notes (Signed)
NG tube attempted x3 unsuccessfully. Pt given dilaudid and 2mg  ativan, as well as cechlor throat spray for tube placement. Tube coiled in mouth. Notified Dr. Heber [place] aware. Will wait for surgery to see patient.

## 2018-09-14 ENCOUNTER — Inpatient Hospital Stay (HOSPITAL_COMMUNITY): Payer: Medicare HMO

## 2018-09-14 LAB — BASIC METABOLIC PANEL
Anion gap: 10 (ref 5–15)
BUN: 14 mg/dL (ref 8–23)
CO2: 25 mmol/L (ref 22–32)
Calcium: 8.9 mg/dL (ref 8.9–10.3)
Chloride: 104 mmol/L (ref 98–111)
Creatinine, Ser: 1.36 mg/dL — ABNORMAL HIGH (ref 0.61–1.24)
GFR calc Af Amer: >60 mL/min (ref >60)
GFR calc non Af Amer: 53 mL/min — ABNORMAL LOW (ref >60)
Glucose, Bld: 108 mg/dL — ABNORMAL HIGH (ref 70–99)
Potassium: 3.9 mmol/L (ref 3.5–5.1)
Sodium: 139 mmol/L (ref 135–145)

## 2018-09-14 LAB — CBC
HCT: 40.4 % (ref 39.0–52.0)
Hemoglobin: 13.5 g/dL (ref 13.0–17.0)
MCH: 30.2 pg (ref 26.0–34.0)
MCHC: 33.4 g/dL (ref 30.0–36.0)
MCV: 90.4 fL (ref 80.0–100.0)
Platelets: 209 10*3/uL (ref 150–400)
RBC: 4.47 MIL/uL (ref 4.22–5.81)
RDW: 13.1 % (ref 11.5–15.5)
WBC: 7.2 10*3/uL (ref 4.0–10.5)
nRBC: 0 % (ref 0.0–0.2)

## 2018-09-14 LAB — GLUCOSE, CAPILLARY
GLUCOSE-CAPILLARY: 95 mg/dL (ref 70–99)
Glucose-Capillary: 96 mg/dL (ref 70–99)
Glucose-Capillary: 127 mg/dL — ABNORMAL HIGH (ref 70–99)
Glucose-Capillary: 66 mg/dL — ABNORMAL LOW (ref 70–99)
Glucose-Capillary: 97 mg/dL (ref 70–99)

## 2018-09-14 LAB — HIV ANTIBODY (ROUTINE TESTING W REFLEX): HIV Screen 4th Generation wRfx: NONREACTIVE

## 2018-09-14 LAB — MAGNESIUM: Magnesium: 1.9 mg/dL (ref 1.7–2.4)

## 2018-09-14 MED ORDER — AMLODIPINE BESYLATE 10 MG PO TABS
10.0000 mg | ORAL_TABLET | Freq: Every day | ORAL | Status: DC
  Administered 2018-09-14 – 2018-09-15 (×2): 10 mg via ORAL
  Filled 2018-09-14 (×2): qty 1

## 2018-09-14 MED ORDER — ATORVASTATIN CALCIUM 40 MG PO TABS
40.0000 mg | ORAL_TABLET | Freq: Every day | ORAL | Status: DC
  Administered 2018-09-14 – 2018-09-15 (×2): 40 mg via ORAL
  Filled 2018-09-14 (×2): qty 1

## 2018-09-14 MED ORDER — ASPIRIN EC 81 MG PO TBEC
81.0000 mg | DELAYED_RELEASE_TABLET | Freq: Every day | ORAL | Status: DC
  Administered 2018-09-14 – 2018-09-15 (×2): 81 mg via ORAL
  Filled 2018-09-14 (×2): qty 1

## 2018-09-14 MED ORDER — POTASSIUM CHLORIDE CRYS ER 20 MEQ PO TBCR
40.0000 meq | EXTENDED_RELEASE_TABLET | Freq: Once | ORAL | Status: AC
  Administered 2018-09-14: 40 meq via ORAL
  Filled 2018-09-14: qty 2
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
## 2018-09-14 NOTE — Progress Notes (Signed)
Pt CBG was 66 alert and oriented, independent, given orange juice and snacks and ate his dinner , rechecked the CBG 95.
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
## 2018-09-14 NOTE — Plan of Care (Signed)
  Problem: Activity: Goal: Risk for activity intolerance will decrease Outcome: Progressing   Problem: Nutrition: Goal: Adequate nutrition will be maintained Outcome: Progressing   Problem: Coping: Goal: Level of anxiety will decrease Outcome: Progressing   

## 2018-09-14 NOTE — Progress Notes (Signed)
Pt NGT discontinued on clear liquids.

## 2018-09-15 DIAGNOSIS — E119 Type 2 diabetes mellitus without complications: Secondary | ICD-10-CM

## 2018-09-15 DIAGNOSIS — Z7984 Long term (current) use of oral hypoglycemic drugs: Secondary | ICD-10-CM

## 2018-09-15 LAB — BASIC METABOLIC PANEL
Anion gap: 9 (ref 5–15)
BUN: 8 mg/dL (ref 8–23)
CO2: 24 mmol/L (ref 22–32)
Calcium: 8.8 mg/dL — ABNORMAL LOW (ref 8.9–10.3)
Chloride: 109 mmol/L (ref 98–111)
Creatinine, Ser: 1.24 mg/dL (ref 0.61–1.24)
GFR calc Af Amer: >60 mL/min (ref >60)
GFR calc non Af Amer: 60 mL/min — ABNORMAL LOW (ref >60)
Glucose, Bld: 128 mg/dL — ABNORMAL HIGH (ref 70–99)
Potassium: 4 mmol/L (ref 3.5–5.1)
Sodium: 142 mmol/L (ref 135–145)

## 2018-09-15 LAB — GLUCOSE, CAPILLARY
Glucose-Capillary: 118 mg/dL — ABNORMAL HIGH (ref 70–99)
Glucose-Capillary: 110 mg/dL — ABNORMAL HIGH (ref 70–99)
Glucose-Capillary: 123 mg/dL — ABNORMAL HIGH (ref 70–99)
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
## 2018-09-15 NOTE — Progress Notes (Signed)
Pt for discharge today going home, alert and oriented , ambulatory, tolerates his meals, no vomiting noted, had bowel movement today, discontinued the peripheral IV line, given health teaching, next appointment, due med explained and understood, given all his personal belongings, no s/s of distress at this time.
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
## 2018-09-27 DIAGNOSIS — E119 Type 2 diabetes mellitus without complications: Secondary | ICD-10-CM | POA: Diagnosis not present

## 2018-10-12 ENCOUNTER — Ambulatory Visit: Payer: Medicare HMO | Attending: Internal Medicine | Admitting: Internal Medicine

## 2018-10-12 ENCOUNTER — Encounter: Payer: Self-pay | Admitting: Internal Medicine

## 2018-10-12 VITALS — BP 150/97 | HR 100 | Temp 98.3°F | Resp 16 | Ht 73.0 in | Wt 212.0 lb

## 2018-10-12 DIAGNOSIS — I10 Essential (primary) hypertension: Secondary | ICD-10-CM | POA: Diagnosis not present

## 2018-10-12 DIAGNOSIS — Z09 Encounter for follow-up examination after completed treatment for conditions other than malignant neoplasm: Secondary | ICD-10-CM

## 2018-10-12 DIAGNOSIS — E1165 Type 2 diabetes mellitus with hyperglycemia: Secondary | ICD-10-CM

## 2018-10-12 DIAGNOSIS — Z7982 Long term (current) use of aspirin: Secondary | ICD-10-CM | POA: Insufficient documentation

## 2018-10-12 DIAGNOSIS — I700 Atherosclerosis of aorta: Secondary | ICD-10-CM | POA: Insufficient documentation

## 2018-10-12 DIAGNOSIS — N529 Male erectile dysfunction, unspecified: Secondary | ICD-10-CM | POA: Insufficient documentation

## 2018-10-12 DIAGNOSIS — Z7984 Long term (current) use of oral hypoglycemic drugs: Secondary | ICD-10-CM | POA: Insufficient documentation

## 2018-10-12 DIAGNOSIS — E785 Hyperlipidemia, unspecified: Secondary | ICD-10-CM | POA: Insufficient documentation

## 2018-10-12 DIAGNOSIS — Z79899 Other long term (current) drug therapy: Secondary | ICD-10-CM | POA: Insufficient documentation

## 2018-10-12 DIAGNOSIS — E119 Type 2 diabetes mellitus without complications: Secondary | ICD-10-CM | POA: Insufficient documentation

## 2018-10-12 LAB — GLUCOSE, POCT (MANUAL RESULT ENTRY): POC Glucose: 174 mg/dl — AB (ref 70–99)

## 2018-10-12 MED ORDER — LISINOPRIL 30 MG PO TABS: 30.0000 mg | ORAL_TABLET | Freq: Every day | ORAL | 1 refills | Status: DC

## 2018-10-12 NOTE — Patient Instructions (Signed)
We have increased your blood pressure medicine called lisinopril from 20 mg daily to 30 mg daily.  Since you still have some of the 20 mg tablets, you should take 1-1/2 tablets daily until you run out.
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
## 2018-10-23 MED FILL — LISINOPRIL 30 MG TABLET: 30 | 90 days supply | Qty: 90 | Fill #0

## 2018-10-25 ENCOUNTER — Telehealth: Payer: Self-pay | Admitting: Internal Medicine

## 2018-10-25 NOTE — Telephone Encounter (Signed)
Patient called because he would like to know why his medication for lisinopril was increased and if there is anything that he can do to improve it. Please follow up

## 2018-10-25 NOTE — Telephone Encounter (Signed)
Returned pt call   I explained to pt that when he seen Dr. Wynetta Emery on 2/14 his bp was evaluated so she increased his blood pressure medicine called lisinopril from 20 mg daily to 30 mg daily.  Pt states he understands and doesn't have any questions or concerns

## 2018-12-11 ENCOUNTER — Other Ambulatory Visit: Payer: Self-pay

## 2018-12-11 ENCOUNTER — Ambulatory Visit (HOSPITAL_COMMUNITY)
Admission: EM | Admit: 2018-12-11 | Discharge: 2018-12-11 | Disposition: A | Discharge status: Home / Self Care | Payer: Medicare HMO | Attending: Family Medicine | Admitting: Family Medicine

## 2018-12-11 ENCOUNTER — Other Ambulatory Visit: Payer: Self-pay | Admitting: Pharmacist

## 2018-12-11 ENCOUNTER — Encounter (HOSPITAL_COMMUNITY): Payer: Self-pay | Admitting: Emergency Medicine

## 2018-12-11 ENCOUNTER — Telehealth: Payer: Self-pay | Admitting: Internal Medicine

## 2018-12-11 DIAGNOSIS — I10 Essential (primary) hypertension: Secondary | ICD-10-CM | POA: Diagnosis not present

## 2018-12-11 DIAGNOSIS — T783XXA Angioneurotic edema, initial encounter: Secondary | ICD-10-CM

## 2018-12-11 MED ORDER — HYDROCHLOROTHIAZIDE 25 MG PO TABS: 25.0000 mg | ORAL_TABLET | Freq: Every day | ORAL | 0 refills | Status: DC

## 2018-12-11 MED ORDER — HYDROCHLOROTHIAZIDE 12.5 MG PO TABS: 12.5000 mg | ORAL_TABLET | Freq: Every day | ORAL | 0 refills | Status: DC

## 2018-12-11 MED ORDER — METHYLPREDNISOLONE SODIUM SUCC 125 MG IJ SOLR
125.0000 mg | Freq: Once | INTRAMUSCULAR | Status: AC
  Administered 2018-12-11: 125 mg via INTRAMUSCULAR

## 2018-12-11 MED ORDER — METHYLPREDNISOLONE SODIUM SUCC 125 MG IJ SOLR
INTRAMUSCULAR | Status: AC
  Filled 2018-12-11: qty 2

## 2018-12-11 MED ORDER — HYDROCHLOROTHIAZIDE 12.5 MG PO TABS: 12.5000 mg | ORAL_TABLET | Freq: Every day | ORAL | 1 refills | Status: DC

## 2018-12-11 MED ORDER — PREDNISONE 50 MG PO TABS: 50.0000 mg | ORAL_TABLET | Freq: Every day | ORAL | 0 refills | Status: AC

## 2018-12-11 NOTE — Telephone Encounter (Signed)
Patients call returned.  Patient identified by name and date of birth.  Patient informed of new appointment.  Patient acknowledged appoint time and date.

## 2018-12-11 NOTE — Discharge Instructions (Addendum)
We gave you a shot of Solu-Medrol today Please begin prednisone daily with food for the next 5 days, begin later this evening or tomorrow May take Benadryl and/or Zyrtec/Claritin to further help with swelling  Stop lisinopril, start hydrochlorothiazide daily as alternative.  This may make you urinate more. Please monitor your blood pressure with starting this new medicine. Please follow-up with your primary care for further management/discussion about medicine changes regarding her blood pressure Please follow-up if developing shortness of breath, chest pain, nausea, vomiting, difficulty breathing

## 2018-12-11 NOTE — ED Triage Notes (Signed)
Pt here with angioedema starting this am; pt denies SOB or difficulty with secretions; pt does take lisinopril

## 2018-12-11 NOTE — Addendum Note (Signed)
Addended by: Karle Plumber B on: 12/11/2018 04:22 PM   Modules accepted: Orders

## 2018-12-11 NOTE — Telephone Encounter (Signed)
Patient called stating they went to the UC because their lips have been swelling. Patient states he was taken off of his lisinopril and to begin taking the hydrochlorothiazide. Patient states he would like to verify with PCP if he should make this medication change and pcp input on what he should do. Please follow up.

## 2018-12-11 NOTE — Telephone Encounter (Signed)
Dr. Wynetta Emery -   Patient in ED today with presumed angioedema d/t lisinopril. They stopped this and started HCTZ. Patient would like this filled at [facility]. I could make the change, however, I wanted to make you aware of the situation. I have pended a 90-day supply of HCTZ and attached it to this telephone encounter. Would you send it in to his [employer] mail-delivery?

## 2018-12-11 NOTE — Telephone Encounter (Signed)
Patients call returned.  Patient identified by name and date of birth.  Patient went to Urgent Care today with spontaneous lip swelling.  Urgent Care stated he was having an allergic reaction to lisinopril.  Patient was given hydrochlorothiazide as a replacement.  Patient was concerned about the new medications having an effect on his kidneys'.  Patient was assured that switch of medications was appropriate. Triage was asked if [employer] would receive the prescription due to Urgent Care giving the patient a paper prescription.  Patient was told that triage would find out information and return a call.  Patient thank Nurse and acknowledged understanding of medication.
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
## 2018-12-25 ENCOUNTER — Ambulatory Visit: Payer: Medicare HMO | Attending: Internal Medicine | Admitting: Pharmacist

## 2018-12-25 ENCOUNTER — Other Ambulatory Visit: Payer: Self-pay

## 2018-12-25 VITALS — BP 132/72 | HR 97

## 2018-12-25 DIAGNOSIS — I10 Essential (primary) hypertension: Secondary | ICD-10-CM

## 2018-12-25 NOTE — Patient Instructions (Signed)

## 2018-12-25 NOTE — Progress Notes (Signed)
    S:    PCP: Dr. Wynetta Emery  Patient arrives in good spirits. Presents to the clinic for hypertension management. Patient was referred by Dr. Wynetta Emery on 12/11/18. Of note, he was switched from lisinopril to HCTZ after presenting to the ED w/ angioedema on that day.  Patient reports adherence with medications.  Denies chest pain, dyspnea, HA, or blurred vision. Denies BLE edema.   Current BP Medications include:   - Amlodipine 10 mg daily.  - HCTZ 12.5 mg daily.  Antihypertensives tried in the past include:  - Lisinopril (angioedema). - Lisinopril-HCTZ.  Dietary habits include: patient limits salt, limits caffeine Exercise habits include: rides bike occasionally  Family / Social history:  - Never smoker - Drinks 1 beer occasionally  Home BP readings: does not take  O:  L arm after 5 minutes rest: 132/72, HR 97  Last 3 Office BP readings: BP Readings from Last 3 Encounters:  12/25/18 132/72  12/11/18 (!) 145/96  10/12/18 (!) 150/97   BMET    Component Value Date/Time   NA 138 12/25/2018 0905   K 4.2 12/25/2018 0905   CL 103 12/25/2018 0905   CO2 20 12/25/2018 0905   GLUCOSE 180 (H) 12/25/2018 0905   GLUCOSE 128 (H) 09/15/2018 0301   BUN 18 12/25/2018 0905   CREATININE 1.19 12/25/2018 0905   CREATININE 1.29 (H) 06/09/2016 1638   CALCIUM 9.9 12/25/2018 0905   GFRNONAA 63 12/25/2018 0905   GFRNONAA 58 (L) 06/09/2016 1638   GFRAA 73 12/25/2018 0905   GFRAA 67 06/09/2016 1638   Renal function: CrCl cannot be calculated (Unknown ideal weight.).  ASCVD: aortic atherosclerosis  The 10-year ASCVD risk score Mikey Bussing DC Jr., et al., 2013) is: 31%   Values used to calculate the score:     Age: 68 years     Sex: Male     Is Non-Hispanic African American: Yes     Diabetic: Yes     Tobacco smoker: No     Systolic Blood Pressure: 488 mmHg     Is BP treated: Yes     HDL Cholesterol: 37 mg/dL     Total Cholesterol: 133 mg/dL  A/P: Hypertension longstanding currently  controlled on current medications. BP Goal <130/80 mmHg. Patient is adherent with current medications.  -Continued amlodipine 10 mg daily, HCTZ 12.5 mg daily.  -F/u labs ordered - CMP14 +GFR -Counseled on lifestyle modifications for blood pressure control including reduced dietary sodium, increased exercise, adequate sleep -HM: UTD on vaccines  -ASCVD: pt tolerating atorvastatin 40 mg well. Encouraged compliance.   Results reviewed and written information provided. Total time in face-to-face counseling 15 minutes.   F/U Clinic Visit w/ PCP prn.   Benard Halsted, PharmD, [place] [phone]

## 2018-12-26 ENCOUNTER — Encounter: Payer: Self-pay | Admitting: Pharmacist

## 2018-12-26 LAB — CMP14+EGFR
ALT: 24 IU/L (ref 0–44)
AST: 23 IU/L (ref 0–40)
Albumin/Globulin Ratio: 1.6 (ref 1.2–2.2)
Albumin: 4.2 g/dL (ref 3.8–4.8)
Alkaline Phosphatase: 77 IU/L (ref 39–117)
BUN/Creatinine Ratio: 15 (ref 10–24)
BUN: 18 mg/dL (ref 8–27)
Bilirubin Total: 0.2 mg/dL (ref 0.0–1.2)
CO2: 20 mmol/L (ref 20–29)
Calcium: 9.9 mg/dL (ref 8.6–10.2)
Chloride: 103 mmol/L (ref 96–106)
Creatinine, Ser: 1.19 mg/dL (ref 0.76–1.27)
GFR calc Af Amer: 73 mL/min/{1.73_m2} (ref >59)
GFR calc non Af Amer: 63 mL/min/{1.73_m2} (ref >59)
Globulin, Total: 2.7 g/dL (ref 1.5–4.5)
Glucose: 180 mg/dL — ABNORMAL HIGH (ref 65–99)
Potassium: 4.2 mmol/L (ref 3.5–5.2)
Sodium: 138 mmol/L (ref 134–144)
Total Protein: 6.9 g/dL (ref 6.0–8.5)

## 2018-12-27 ENCOUNTER — Telehealth: Payer: Self-pay

## 2018-12-27 NOTE — Telephone Encounter (Signed)
Contacted pt to go over lab results pt didn't answer lvm asking pt to give me a call at his earliest convenience  

## 2018-12-27 NOTE — Telephone Encounter (Signed)
error 

## 2019-02-26 ENCOUNTER — Ambulatory Visit: Payer: Medicare HMO | Attending: Internal Medicine | Admitting: Internal Medicine

## 2019-02-26 ENCOUNTER — Other Ambulatory Visit: Payer: Self-pay

## 2019-02-26 ENCOUNTER — Encounter: Payer: Self-pay | Admitting: Internal Medicine

## 2019-02-26 DIAGNOSIS — E119 Type 2 diabetes mellitus without complications: Secondary | ICD-10-CM

## 2019-02-26 DIAGNOSIS — E785 Hyperlipidemia, unspecified: Secondary | ICD-10-CM

## 2019-02-26 DIAGNOSIS — I10 Essential (primary) hypertension: Secondary | ICD-10-CM | POA: Diagnosis not present

## 2019-02-26 DIAGNOSIS — E1169 Type 2 diabetes mellitus with other specified complication: Secondary | ICD-10-CM

## 2019-02-26 MED ORDER — METFORMIN HCL 1000 MG PO TABS: 1000.0000 mg | ORAL_TABLET | Freq: Two times a day (BID) | ORAL | 3 refills | Status: DC

## 2019-02-26 NOTE — Progress Notes (Signed)
Virtual Visit via Telephone Note Due to current restrictions/limitations of in-office visits due to the COVID-19 pandemic, this scheduled clinical appointment was converted to a telehealth visit  I connected with Logan Knight on 02/26/19 at 1:43 p.m EDT by telephone and verified that I am speaking with the correct person using two identifiers. I am in my office.  The patient is at home.  Only the patient and myself participated in this encounter.  I discussed the limitations, risks, security and privacy concerns of performing an evaluation and management service by telephone and the availability of in person appointments. I also discussed with the patient that there may be a patient responsible charge related to this service. The patient expressed understanding and agreed to proceed.   History of Present Illness: Hx of DM, HTN, HL, OA LT kneeand ED   DM:  No longer has a meter to check BS Med: reports compliance with Metformin XR Eating habits:  Does well with eating habits.  Does not drink sugary drinks. Exercise:  Walk and ride his bike 3-4 x a wk No blurred vision  HYPERTENSION Currently taking: see medication list Med Adherence: '[x]'$  Yes    '[]'$  No Medication side effects: '[]'$  Yes    '[x]'$  No Adherence with salt restriction: '[x]'$  Yes    '[]'$  No Home Monitoring?: '[]'$  Yes    '[x]'$  No device to check Monitoring Frequency: '[]'$  Yes    '[]'$  No Home BP results range: '[]'$  Yes    '[]'$  No SOB? '[]'$  Yes    '[x]'$  No Chest Pain?: '[]'$  Yes    '[x]'$  No Leg swelling?: '[]'$  Yes    '[x]'$  No Headaches?: '[]'$  Yes    '[x]'$  No Dizziness? '[]'$  Yes    '[x]'$  No Comments:   HL:  Compliant with Lipitor.   Outpatient Encounter Medications as of 02/26/2019  Medication Sig  . amLODipine (NORVASC) 10 MG tablet Take 1 tablet (10 mg total) by mouth daily.  Marland Kitchen aspirin EC 81 MG tablet Take 1 tablet (81 mg total) by mouth daily.  Marland Kitchen atorvastatin (LIPITOR) 40 MG tablet Take 1 tablet (40 mg total) by mouth daily.  . Blood Glucose Monitoring  Suppl (TRUERESULT BLOOD GLUCOSE) W/DEVICE KIT 1 each by Does not apply route once.  . cyclobenzaprine (FLEXERIL) 5 MG tablet Take 1 tablet (5 mg total) by mouth 3 (three) times daily as needed. (Patient taking differently: Take 5 mg by mouth 3 (three) times daily as needed for muscle spasms. )  . glucose blood (TRUETEST TEST) test strip Use as instructed  . hydrochlorothiazide (HYDRODIURIL) 12.5 MG tablet Take 1 tablet (12.5 mg total) by mouth daily.  Marland Kitchen ibuprofen (ADVIL,MOTRIN) 600 MG tablet Take 1 tablet (600 mg total) by mouth every 6 (six) hours as needed. (Patient taking differently: Take 600 mg by mouth every 6 (six) hours as needed for moderate pain. )  . Lancet Device MISC 1 each by Does not apply route once.  . metFORMIN (GLUCOPHAGE XR) 500 MG 24 hr tablet 2 tabs in a.m and 2 tab in p.m  . sildenafil (VIAGRA) 100 MG tablet Take 1/2-1 tab PO PRN 1/2 hr prior to intercourse.  Limit use to 1 tab/24 hr   No facility-administered encounter medications on file as of 02/26/2019.     Observations/Objective:  Results for orders placed or performed in visit on 12/25/18  CMP14+EGFR  Result Value Ref Range   Glucose 180 (H) 65 - 99 mg/dL   BUN 18 8 - 27 mg/dL  Creatinine, Ser 1.19 0.76 - 1.27 mg/dL   GFR calc non Af Amer 63 >59 mL/min/1.73   GFR calc Af Amer 73 >59 mL/min/1.73   BUN/Creatinine Ratio 15 10 - 24   Sodium 138 134 - 144 mmol/L   Potassium 4.2 3.5 - 5.2 mmol/L   Chloride 103 96 - 106 mmol/L   CO2 20 20 - 29 mmol/L   Calcium 9.9 8.6 - 10.2 mg/dL   Total Protein 6.9 6.0 - 8.5 g/dL   Albumin 4.2 3.8 - 4.8 g/dL   Globulin, Total 2.7 1.5 - 4.5 g/dL   Albumin/Globulin Ratio 1.6 1.2 - 2.2   Bilirubin Total 0.2 0.0 - 1.2 mg/dL   Alkaline Phosphatase 77 39 - 117 IU/L   AST 23 0 - 40 IU/L   ALT 24 0 - 44 IU/L   Last A1C 6.5 08/2018 Assessment and Plan: 1. Controlled type 2 diabetes mellitus without complication, without long-term current use of insulin ([place]) -Patient does not  have a device to check blood sugar and he declines getting a new meter at this time.  He is agreeable to coming to the lab to have an A1c done since he has not had one in the past 6 months.  I have encouraged him to continue healthy eating habits and regular exercise. He is on Metformin XR.  This medication has been recalled.  I have informed the patient of this.  We will switch him to the regular metformin thousand milligrams twice a day. - Hemoglobin A1c; Future  2. Essential hypertension Level of control unknown as he does not have a device.  However he did see a clinical pharmacist back in April for repeat blood pressure check that was within goal.  He will continue his current medications  3. Hyperlipidemia associated with type 2 diabetes mellitus (HCC) Continue atorvastatin   Follow Up Instructions: F/u in 3 mths   I discussed the assessment and treatment plan with the patient. The patient was provided an opportunity to ask questions and all were answered. The patient agreed with the plan and demonstrated an understanding of the instructions.   The patient was advised to call back or seek an in-person evaluation if the symptoms worsen or if the condition fails to improve as anticipated.  I provided 11 minutes of non-face-to-face time during this encounter.   Karle Plumber, MD

## 2019-02-26 NOTE — Progress Notes (Signed)
Patient verified DOB Patient has taken medication today. Patient has eaten today. Patient denies pain at this time. 

## 2019-03-08 ENCOUNTER — Other Ambulatory Visit: Payer: Self-pay | Admitting: Internal Medicine

## 2019-03-08 DIAGNOSIS — N529 Male erectile dysfunction, unspecified: Secondary | ICD-10-CM

## 2019-03-08 DIAGNOSIS — I10 Essential (primary) hypertension: Secondary | ICD-10-CM

## 2019-03-08 DIAGNOSIS — E785 Hyperlipidemia, unspecified: Secondary | ICD-10-CM

## 2019-03-14 ENCOUNTER — Other Ambulatory Visit: Payer: Self-pay

## 2019-03-14 DIAGNOSIS — Z20822 Contact with and (suspected) exposure to covid-19: Secondary | ICD-10-CM

## 2019-03-19 LAB — NOVEL CORONAVIRUS, NAA: SARS-CoV-2, NAA: NOT DETECTED

## 2019-03-21 ENCOUNTER — Telehealth: Payer: Self-pay

## 2019-03-21 NOTE — Telephone Encounter (Signed)
Contacted pt to go over COVID-19 results pt didn't answer vm asking pt to give me a call back

## 2019-05-09 ENCOUNTER — Telehealth: Payer: Self-pay | Admitting: Internal Medicine

## 2019-05-09 NOTE — Telephone Encounter (Signed)
Attempted to reach patient to reschedule 9/28 appt. Please follow up

## 2019-05-27 ENCOUNTER — Ambulatory Visit: Payer: Medicare HMO | Admitting: Internal Medicine

## 2019-06-26 ENCOUNTER — Other Ambulatory Visit: Payer: Self-pay | Admitting: Internal Medicine

## 2019-06-26 DIAGNOSIS — E785 Hyperlipidemia, unspecified: Secondary | ICD-10-CM

## 2019-07-11 ENCOUNTER — Ambulatory Visit: Payer: Medicare HMO | Admitting: Internal Medicine

## 2019-07-15 ENCOUNTER — Ambulatory Visit: Payer: Medicare HMO | Attending: Internal Medicine | Admitting: Internal Medicine

## 2019-07-15 ENCOUNTER — Ambulatory Visit (HOSPITAL_BASED_OUTPATIENT_CLINIC_OR_DEPARTMENT_OTHER): Payer: Medicare HMO | Admitting: Pharmacist

## 2019-07-15 ENCOUNTER — Other Ambulatory Visit: Payer: Self-pay

## 2019-07-15 ENCOUNTER — Encounter: Payer: Self-pay | Admitting: Internal Medicine

## 2019-07-15 VITALS — BP 160/85 | HR 84 | Temp 98.3°F | Resp 16 | Wt 207.0 lb

## 2019-07-15 DIAGNOSIS — I10 Essential (primary) hypertension: Secondary | ICD-10-CM

## 2019-07-15 DIAGNOSIS — N529 Male erectile dysfunction, unspecified: Secondary | ICD-10-CM

## 2019-07-15 DIAGNOSIS — E119 Type 2 diabetes mellitus without complications: Secondary | ICD-10-CM

## 2019-07-15 DIAGNOSIS — Z23 Encounter for immunization: Secondary | ICD-10-CM

## 2019-07-15 DIAGNOSIS — E785 Hyperlipidemia, unspecified: Secondary | ICD-10-CM

## 2019-07-15 DIAGNOSIS — E1169 Type 2 diabetes mellitus with other specified complication: Secondary | ICD-10-CM

## 2019-07-15 LAB — POCT GLYCOSYLATED HEMOGLOBIN (HGB A1C): HbA1c, POC (prediabetic range): 5.8 % (ref 5.7–6.4)

## 2019-07-15 LAB — GLUCOSE, POCT (MANUAL RESULT ENTRY): POC Glucose: 172 mg/dl — AB (ref 70–99)

## 2019-07-15 MED ORDER — HYDROCHLOROTHIAZIDE 12.5 MG PO TABS: 12.5000 mg | ORAL_TABLET | Freq: Every day | ORAL | 1 refills | Status: DC

## 2019-07-15 MED ORDER — SILDENAFIL CITRATE 100 MG PO TABS: ORAL_TABLET | ORAL | 2 refills | Status: DC

## 2019-07-15 MED ORDER — AMLODIPINE BESYLATE 10 MG PO TABS: 10.0000 mg | ORAL_TABLET | Freq: Every day | ORAL | 1 refills | Status: DC

## 2019-07-15 MED ORDER — HYDROCHLOROTHIAZIDE 12.5 MG PO TABS: 12.5000 mg | ORAL_TABLET | Freq: Every day | ORAL | 3 refills | Status: DC

## 2019-07-15 MED ORDER — ATORVASTATIN CALCIUM 40 MG PO TABS: 40.0000 mg | ORAL_TABLET | Freq: Every day | ORAL | 1 refills | Status: DC

## 2019-07-15 NOTE — Patient Instructions (Addendum)
Please give patient an appointment with the clinical pharmacist in 2 weeks for repeat blood pressure check.  Try to check your blood pressure at least twice a week.  The goal is 130/80 or lower. Stop the medication called lisinopril.  The blood pressure medicine that you should be taking is called hydrochlorothiazide.  I have sent a refill on that to [facility] to be delivered to you.   Influenza Virus Vaccine injection (Fluarix) What is this medicine? INFLUENZA VIRUS VACCINE (in floo EN zuh VAHY ruhs vak SEEN) helps to reduce the risk of getting influenza also known as the flu. This medicine may be used for other purposes; ask your health care provider or pharmacist if you have questions. COMMON BRAND NAME(S): Fluarix, Fluzone What should I tell my health care provider before I take this medicine? They need to know if you have any of these conditions:  bleeding disorder like hemophilia  fever or infection  Guillain-Barre syndrome or other neurological problems  immune system problems  infection with the human immunodeficiency virus (HIV) or AIDS  low blood platelet counts  multiple sclerosis  an unusual or allergic reaction to influenza virus vaccine, eggs, chicken proteins, latex, gentamicin, other medicines, foods, dyes or preservatives  pregnant or trying to get pregnant  breast-feeding How should I use this medicine? This vaccine is for injection into a muscle. It is given by a health care professional. A copy of Vaccine Information Statements will be given before each vaccination. Read this sheet carefully each time. The sheet may change frequently. Talk to your pediatrician regarding the use of this medicine in children. Special care may be needed. Overdosage: If you think you have taken too much of this medicine contact a poison control center or emergency room at once. NOTE: This medicine is only for you. Do not share this medicine with others. What if I miss a  dose? This does not apply. What may interact with this medicine?  chemotherapy or radiation therapy  medicines that lower your immune system like etanercept, anakinra, infliximab, and adalimumab  medicines that treat or prevent blood clots like warfarin  phenytoin  steroid medicines like prednisone or cortisone  theophylline  vaccines This list may not describe all possible interactions. Give your health care provider a list of all the medicines, herbs, non-prescription drugs, or dietary supplements you use. Also tell them if you smoke, drink alcohol, or use illegal drugs. Some items may interact with your medicine. What should I watch for while using this medicine? Report any side effects that do not go away within 3 days to your doctor or health care professional. Call your health care provider if any unusual symptoms occur within 6 weeks of receiving this vaccine. You may still catch the flu, but the illness is not usually as bad. You cannot get the flu from the vaccine. The vaccine will not protect against colds or other illnesses that may cause fever. The vaccine is needed every year. What side effects may I notice from receiving this medicine? Side effects that you should report to your doctor or health care professional as soon as possible:  allergic reactions like skin rash, itching or hives, swelling of the face, lips, or tongue Side effects that usually do not require medical attention (report to your doctor or health care professional if they continue or are bothersome):  fever  headache  muscle aches and pains  pain, tenderness, redness, or swelling at site where injected  weak or tired This  list may not describe all possible side effects. Call your doctor for medical advice about side effects. You may report side effects to FDA at 1-800-FDA-1088. Where should I keep my medicine? This vaccine is only given in a clinic, pharmacy, doctor's office, or other health care  setting and will not be stored at home. NOTE: This sheet is a summary. It may not cover all possible information. If you have questions about this medicine, talk to your doctor, pharmacist, or health care provider.  2020 Elsevier/Gold Standard (2008-03-12 09:30:40)
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
## 2019-07-15 NOTE — Progress Notes (Signed)
Patient presents for vaccination against influenza per orders of Dr. Johnson. Consent given. Counseling provided. No contraindications exists. Vaccine administered without incident.   

## 2019-07-16 LAB — LIPID PANEL
Chol/HDL Ratio: 3.3 ratio (ref 0.0–5.0)
Cholesterol, Total: 146 mg/dL (ref 100–199)
HDL: 44 mg/dL (ref >39)
LDL Chol Calc (NIH): 68 mg/dL (ref 0–99)
Triglycerides: 208 mg/dL — ABNORMAL HIGH (ref 0–149)
VLDL Cholesterol Cal: 34 mg/dL (ref 5–40)

## 2019-07-16 LAB — COMPREHENSIVE METABOLIC PANEL
ALT: 21 IU/L (ref 0–44)
AST: 30 IU/L (ref 0–40)
Albumin/Globulin Ratio: 1.3 (ref 1.2–2.2)
Albumin: 4.5 g/dL (ref 3.8–4.8)
Alkaline Phosphatase: 73 IU/L (ref 39–117)
BUN/Creatinine Ratio: 9 — ABNORMAL LOW (ref 10–24)
BUN: 11 mg/dL (ref 8–27)
Bilirubin Total: 0.4 mg/dL (ref 0.0–1.2)
CO2: 22 mmol/L (ref 20–29)
Calcium: 9.9 mg/dL (ref 8.6–10.2)
Chloride: 101 mmol/L (ref 96–106)
Creatinine, Ser: 1.22 mg/dL (ref 0.76–1.27)
GFR calc Af Amer: 70 mL/min/{1.73_m2} (ref >59)
GFR calc non Af Amer: 61 mL/min/{1.73_m2} (ref >59)
Globulin, Total: 3.4 g/dL (ref 1.5–4.5)
Glucose: 114 mg/dL — ABNORMAL HIGH (ref 65–99)
Potassium: 4.1 mmol/L (ref 3.5–5.2)
Sodium: 140 mmol/L (ref 134–144)
Total Protein: 7.9 g/dL (ref 6.0–8.5)

## 2019-07-16 LAB — MICROALBUMIN / CREATININE URINE RATIO
Creatinine, Urine: 90.4 mg/dL
Microalb/Creat Ratio: 53 mg/g creat — ABNORMAL HIGH (ref 0–29)
Microalbumin, Urine: 48.3 ug/mL

## 2019-07-17 ENCOUNTER — Telehealth: Payer: Self-pay

## 2019-07-17 NOTE — Telephone Encounter (Signed)
Contacted pt to go over lab results pt didn't answer lvm asking pt to give me a call at his earliest convenience  

## 2019-07-18 ENCOUNTER — Telehealth: Payer: Self-pay | Admitting: Internal Medicine

## 2019-07-18 NOTE — Telephone Encounter (Signed)
Patient called for results °

## 2019-07-23 NOTE — Telephone Encounter (Signed)
Returned pt call to go over lab results pt is aware and doesn't have any questions or concerns  

## 2019-07-30 ENCOUNTER — Other Ambulatory Visit: Payer: Self-pay

## 2019-07-30 ENCOUNTER — Ambulatory Visit: Payer: Medicare HMO | Attending: Internal Medicine | Admitting: Pharmacist

## 2019-07-30 VITALS — BP 123/68 | HR 99

## 2019-07-30 DIAGNOSIS — I10 Essential (primary) hypertension: Secondary | ICD-10-CM | POA: Diagnosis not present

## 2019-07-30 NOTE — Progress Notes (Signed)
   S:    Patient arrives in good spirits. Presents to the clinic for hypertension evaluation, counseling, and management.  Patient was referred and last seen by Primary Care Provider on 07/15/19.    Patient denies adherence with medications. Patient presents with two amlodipine 10 mg bottles and reports taking one from each bottle daily (TDD of 20mg ). He brings in his HCTZ but reports not starting it.   Current BP Medications include:   - amlodipine 10 mg daily - hctz 12.5 mg daily  Antihypertensives tried in the past include: lisinopril (lip swelling)  Dietary habits include: compliant with salt restriction; denies drinking excess caffeine  Exercise habits include: rides bike 30-40 mins/3x per week Family / Social history:  - DM (mother) - Tobacco: never smoker - Alcohol: reports occasional use   O:  Vitals:   07/30/19 1253  BP: 123/68  Pulse: 99    Home BP readings: none; no device Last 3 Office BP readings: BP Readings from Last 3 Encounters:  07/30/19 123/68  07/15/19 (!) 160/85  12/25/18 132/72   BMET    Component Value Date/Time   NA 140 07/15/2019 1151   K 4.1 07/15/2019 1151   CL 101 07/15/2019 1151   CO2 22 07/15/2019 1151   GLUCOSE 114 (H) 07/15/2019 1151   GLUCOSE 128 (H) 09/15/2018 0301   BUN 11 07/15/2019 1151   CREATININE 1.22 07/15/2019 1151   CREATININE 1.29 (H) 06/09/2016 1638   CALCIUM 9.9 07/15/2019 1151   GFRNONAA 61 07/15/2019 1151   GFRNONAA 58 (L) 06/09/2016 1638   GFRAA 70 07/15/2019 1151   GFRAA 67 06/09/2016 1638   A/P: Hypertension longstanding currently at goal, however, he is taking 20 mg daily of amlodipine. I have consolidated his bottles to minimize confusion and instructed him to take amlodipine 10 mg daily along with HCTZ 12.5 mg daily. SBP goal = 130 mmHg.  -Continued amlodipine and HCTZ.  -F/u in 1 week to reassess.  -Counseled on lifestyle modifications for blood pressure control including reduced dietary sodium, increased  exercise, adequate sleep  Results reviewed and written information provided.   Total time in face-to-face counseling 15 minutes.   F/U Clinic Visit in 1 week.  Benard Halsted, PharmD, [place] [phone]

## 2019-08-06 ENCOUNTER — Other Ambulatory Visit: Payer: Self-pay

## 2019-08-06 ENCOUNTER — Ambulatory Visit: Payer: Medicare HMO | Attending: Internal Medicine | Admitting: Pharmacist

## 2019-08-06 VITALS — BP 148/79 | HR 109

## 2019-08-06 DIAGNOSIS — I10 Essential (primary) hypertension: Secondary | ICD-10-CM | POA: Diagnosis not present

## 2019-08-06 MED ORDER — HYDROCHLOROTHIAZIDE 25 MG PO TABS: 25.0000 mg | ORAL_TABLET | Freq: Every day | ORAL | 1 refills | Status: DC

## 2019-08-06 NOTE — Progress Notes (Signed)
   S:    PCP: Dr. Wynetta Emery  Patient arrives in good spirits. Presents to the clinic for hypertension evaluation, counseling, and management.  Patient was referred and last seen by Primary Care Provider on 07/15/19. I saw him 07/30/19 and pt reported taking 20 mg of amlodipine daily. I instructed him to take 10 mg daily as prescribed along with 12.5 mg daily of HCTZ.  Patient reports adherence with medications.  Current BP Medications include:   - amlodipine 10 mg daily - hctz 12.5 mg daily  Antihypertensives tried in the past include: lisinopril (lip swelling)  Dietary habits include: compliant with salt restriction; denies drinking excess caffeine  Exercise habits include: rides bike 30-40 mins/3x per week Family / Social history:  - DM (mother) - Tobacco: never smoker - Alcohol: reports occasional use   O:  Vitals:   08/06/19 0927  BP: (!) 148/79  Pulse: (!) 109   Home BP readings: none; no device Last 3 Office BP readings: BP Readings from Last 3 Encounters:  08/06/19 (!) 148/79  07/30/19 123/68  07/15/19 (!) 160/85   BMET    Component Value Date/Time   NA 140 07/15/2019 1151   K 4.1 07/15/2019 1151   CL 101 07/15/2019 1151   CO2 22 07/15/2019 1151   GLUCOSE 114 (H) 07/15/2019 1151   GLUCOSE 128 (H) 09/15/2018 0301   BUN 11 07/15/2019 1151   CREATININE 1.22 07/15/2019 1151   CREATININE 1.29 (H) 06/09/2016 1638   CALCIUM 9.9 07/15/2019 1151   GFRNONAA 61 07/15/2019 1151   GFRNONAA 58 (L) 06/09/2016 1638   GFRAA 70 07/15/2019 1151   GFRAA 67 06/09/2016 1638   A/P: Hypertension longstanding currently above goal. Pt reports adherence to medications and has taken today. -Continued amlodipine. -Increase HCTZ.  -F/u in 1 month to reassess.  -Counseled on lifestyle modifications for blood pressure control including reduced dietary sodium, increased exercise, adequate sleep  Results reviewed and written information provided.   Total time in face-to-face counseling  15 minutes.   F/U Clinic Visit in 1 week.  Benard Halsted, PharmD, [place] [phone]

## 2019-08-07 NOTE — Patient Instructions (Signed)
Thank you for coming to see [place] today.   Blood pressure today is elevated.   Start taking HCTZ 25 mg daily. Continue amlodipine 10 mg daily.   Limiting salt and caffeine, as well as exercising as able for at least 30 minutes for 5 days out of the week, can also help you lower your blood pressure.  Take your blood pressure at home if you are able. Please write down these numbers and bring them to your visits.  If you have any questions about medications, please call me [phone].  Lurena Joiner

## 2019-09-06 ENCOUNTER — Ambulatory Visit: Payer: Medicare HMO | Admitting: Pharmacist

## 2019-09-11 ENCOUNTER — Ambulatory Visit: Payer: Medicare HMO | Admitting: Pharmacist

## 2019-11-14 ENCOUNTER — Other Ambulatory Visit: Payer: Self-pay

## 2019-11-14 ENCOUNTER — Ambulatory Visit: Payer: Medicare HMO | Attending: Internal Medicine | Admitting: Internal Medicine

## 2019-11-14 VITALS — BP 144/86 | HR 112 | Temp 96.6°F | Resp 16 | Wt 206.0 lb

## 2019-11-14 DIAGNOSIS — I10 Essential (primary) hypertension: Secondary | ICD-10-CM

## 2019-11-14 DIAGNOSIS — E119 Type 2 diabetes mellitus without complications: Secondary | ICD-10-CM

## 2019-11-14 DIAGNOSIS — R1031 Right lower quadrant pain: Secondary | ICD-10-CM

## 2019-11-14 DIAGNOSIS — R000 Tachycardia, unspecified: Secondary | ICD-10-CM | POA: Diagnosis not present

## 2019-11-14 LAB — GLUCOSE, POCT (MANUAL RESULT ENTRY): POC Glucose: 157 mg/dl — AB (ref 70–99)

## 2019-11-14 LAB — POCT GLYCOSYLATED HEMOGLOBIN (HGB A1C): HbA1c, POC (prediabetic range): 6.2 % (ref 5.7–6.4)

## 2019-11-14 MED ORDER — CARVEDILOL 3.125 MG PO TABS: 3.1250 mg | ORAL_TABLET | Freq: Two times a day (BID) | ORAL | 3 refills | Status: DC

## 2019-11-14 MED FILL — CARVEDILOL 3.125 MG TABLET: 3.125 | 30 days supply | Qty: 60 | Fill #0
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
## 2019-11-14 NOTE — Patient Instructions (Addendum)
Please schedule 1 month with luke for Medicare Wellness    Your blood pressure is not at goal.  The goal is to be 130/80 or lower.  Try to check your blood pressure at least twice a week.  You should continue taking the hydrochlorothiazide and amlodipine.  We have added another blood pressure medicine called carvedilol 3.125 mg which you will take twice a day.

## 2019-11-15 LAB — BASIC METABOLIC PANEL
BUN/Creatinine Ratio: 7 — ABNORMAL LOW (ref 10–24)
BUN: 10 mg/dL (ref 8–27)
CO2: 21 mmol/L (ref 20–29)
Calcium: 10.1 mg/dL (ref 8.6–10.2)
Chloride: 101 mmol/L (ref 96–106)
Creatinine, Ser: 1.44 mg/dL — ABNORMAL HIGH (ref 0.76–1.27)
GFR calc Af Amer: 57 mL/min/{1.73_m2} — ABNORMAL LOW (ref >59)
GFR calc non Af Amer: 50 mL/min/{1.73_m2} — ABNORMAL LOW (ref >59)
Glucose: 149 mg/dL — ABNORMAL HIGH (ref 65–99)
Potassium: 4 mmol/L (ref 3.5–5.2)
Sodium: 139 mmol/L (ref 134–144)

## 2019-11-15 LAB — MICROALBUMIN / CREATININE URINE RATIO
Creatinine, Urine: 202.6 mg/dL
Microalb/Creat Ratio: 53 mg/g creat — ABNORMAL HIGH (ref 0–29)
Microalbumin, Urine: 108.3 ug/mL

## 2019-11-15 LAB — TSH: TSH: 4.92 u[IU]/mL — ABNORMAL HIGH (ref 0.450–4.500)

## 2019-11-17 NOTE — Progress Notes (Signed)
Let pt know that he has a small amount of protein in the urine that is stable and unchanged from 4 months ago but much less compared to one year ago.  This is good.  Kidney function decreased since being on Hydrochlorothiazide.  Make sure he drinks several glasses of water daily.  Thyroid level is slightly abnormal.  We will recheck level again in 3-4 mths to see if any significant change that would warrant treatment.

## 2019-11-18 ENCOUNTER — Telehealth: Payer: Self-pay

## 2019-11-18 ENCOUNTER — Telehealth: Payer: Self-pay | Admitting: Internal Medicine

## 2019-11-18 NOTE — Telephone Encounter (Signed)
Pt called stating Dr Wynetta Emery called and left a voicemail to go over results and would like a call back

## 2019-11-18 NOTE — Telephone Encounter (Signed)
Contacted pt to go over lab results pt didn't answer lvm asking pt to give a call back at his earliest convenience  

## 2019-11-18 NOTE — Telephone Encounter (Signed)
Pt returned call to go over lab results pt is aware and doesn't have any questions or concerns

## 2019-12-17 ENCOUNTER — Ambulatory Visit: Payer: Medicare HMO | Admitting: Pharmacist

## 2020-01-10 ENCOUNTER — Other Ambulatory Visit: Payer: Self-pay | Admitting: Internal Medicine

## 2020-01-10 DIAGNOSIS — I10 Essential (primary) hypertension: Secondary | ICD-10-CM

## 2020-01-10 DIAGNOSIS — E1169 Type 2 diabetes mellitus with other specified complication: Secondary | ICD-10-CM

## 2020-01-28 DIAGNOSIS — Z961 Presence of intraocular lens: Secondary | ICD-10-CM | POA: Diagnosis not present

## 2020-01-28 DIAGNOSIS — H2512 Age-related nuclear cataract, left eye: Secondary | ICD-10-CM | POA: Diagnosis not present

## 2020-01-28 DIAGNOSIS — H31013 Macula scars of posterior pole (postinflammatory) (post-traumatic), bilateral: Secondary | ICD-10-CM | POA: Diagnosis not present

## 2020-01-28 DIAGNOSIS — H35033 Hypertensive retinopathy, bilateral: Secondary | ICD-10-CM | POA: Diagnosis not present

## 2020-01-28 DIAGNOSIS — E119 Type 2 diabetes mellitus without complications: Secondary | ICD-10-CM | POA: Diagnosis not present

## 2020-01-28 LAB — HM DIABETES EYE EXAM

## 2020-01-29 ENCOUNTER — Encounter: Payer: Self-pay | Admitting: Internal Medicine

## 2020-01-29 DIAGNOSIS — H35033 Hypertensive retinopathy, bilateral: Secondary | ICD-10-CM | POA: Insufficient documentation

## 2020-02-14 ENCOUNTER — Other Ambulatory Visit: Payer: Self-pay

## 2020-02-14 ENCOUNTER — Ambulatory Visit: Payer: Medicare HMO | Attending: Internal Medicine | Admitting: Internal Medicine

## 2020-02-14 DIAGNOSIS — E1129 Type 2 diabetes mellitus with other diabetic kidney complication: Secondary | ICD-10-CM

## 2020-02-14 DIAGNOSIS — E1169 Type 2 diabetes mellitus with other specified complication: Secondary | ICD-10-CM | POA: Diagnosis not present

## 2020-02-14 DIAGNOSIS — I10 Essential (primary) hypertension: Secondary | ICD-10-CM

## 2020-02-14 DIAGNOSIS — Z1211 Encounter for screening for malignant neoplasm of colon: Secondary | ICD-10-CM | POA: Diagnosis not present

## 2020-02-14 DIAGNOSIS — R809 Proteinuria, unspecified: Secondary | ICD-10-CM | POA: Diagnosis not present

## 2020-02-14 DIAGNOSIS — E785 Hyperlipidemia, unspecified: Secondary | ICD-10-CM

## 2020-02-14 DIAGNOSIS — Z23 Encounter for immunization: Secondary | ICD-10-CM | POA: Diagnosis not present

## 2020-02-14 DIAGNOSIS — R7989 Other specified abnormal findings of blood chemistry: Secondary | ICD-10-CM

## 2020-02-14 DIAGNOSIS — N529 Male erectile dysfunction, unspecified: Secondary | ICD-10-CM | POA: Diagnosis not present

## 2020-02-14 MED ORDER — SILDENAFIL CITRATE 100 MG PO TABS: ORAL_TABLET | ORAL | 2 refills | Status: DC

## 2020-02-14 NOTE — Progress Notes (Signed)
Virtual Visit via Telephone Note Due to current restrictions/limitations of in-office visits due to the COVID-19 pandemic, this scheduled clinical appointment was converted to a telehealth visit  I connected with Logan Knight on 02/14/20 at 8:36 a.m by telephone and verified that I am speaking with the correct person using two identifiers. I am in my office.  The patient is at home.  Only the patient and myself participated in this encounter.  I discussed the limitations, risks, security and privacy concerns of performing an evaluation and management service by telephone and the availability of in person appointments. I also discussed with the patient that there may be a patient responsible charge related to this service. The patient expressed understanding and agreed to proceed.   History of Present Illness: Hx of DM with microalbumin, HTN with hypertensive retinopathy, HL, OA LT kneeand ED. purpose of today's visit is chronic disease management.  Patient last seen 10/2019.  HYPERTENSION Currently taking: see medication list Med Adherence: '[x]'$  Yes -Coreg added to Norvasc and HCTZ on last visit Medication side effects: '[]'$  Yes    '[x]'$  No Adherence with salt restriction: '[x]'$  Yes    '[]'$  No Home Monitoring?: '[x]'$  Yes   Monitoring Frequency: once a wk but has not been writing it down.  However he states it has been at the recommended range that I have told and written down for him. Home BP results range: '[]'$  Yes    '[]'$  No SOB? '[]'$  Yes    '[x]'$  No Chest Pain?: '[]'$  Yes    '[x]'$  No Leg swelling?: '[]'$  Yes    '[x]'$  No Headaches?: '[]'$  Yes    '[x]'$  No Dizziness? '[]'$  Yes    '[x]'$  No Comments: Last BMP revealed a decline in his GFR from 70 down to 57 and slight bump in creatinine of 1.44.  This occurred after HCTZ was increased.  Patient was advised to drink adequate fluids during the day to keep himself hydrated.  He states that he has been doing so.  HL: tolerating and taking Lipitor.  ED:  Request RF on  Sildenifil  DIABETES TYPE 2 Last A1C:   Results for orders placed or performed in visit on 11/14/19  Microalbumin / creatinine urine ratio  Result Value Ref Range   Creatinine, Urine 202.6 Not Estab. mg/dL   Microalbumin, Urine 108.3 Not Estab. ug/mL   Microalb/Creat Ratio 53 (H) 0 - 29 mg/g creat  Basic Metabolic Panel  Result Value Ref Range   Glucose 149 (H) 65 - 99 mg/dL   BUN 10 8 - 27 mg/dL   Creatinine, Ser 1.44 (H) 0.76 - 1.27 mg/dL   GFR calc non Af Amer 50 (L) >59 mL/min/1.73   GFR calc Af Amer 57 (L) >59 mL/min/1.73   BUN/Creatinine Ratio 7 (L) 10 - 24   Sodium 139 134 - 144 mmol/L   Potassium 4.0 3.5 - 5.2 mmol/L   Chloride 101 96 - 106 mmol/L   CO2 21 20 - 29 mmol/L   Calcium 10.1 8.6 - 10.2 mg/dL  TSH  Result Value Ref Range   TSH 4.920 (H) 0.450 - 4.500 uIU/mL  POCT glucose (manual entry)  Result Value Ref Range   POC Glucose 157 (A) 70 - 99 mg/dl  POCT glycosylated hemoglobin (Hb A1C)  Result Value Ref Range   Hemoglobin A1C     HbA1c POC (<> result, manual entry)     HbA1c, POC (prediabetic range) 6.2 5.7 - 6.4 %   HbA1c, POC (controlled diabetic  range)      Med Adherence:  '[x]'$  Yes on Metformin Medication side effects:  '[]'$  Yes    '[x]'$  No Home Monitoring?  '[]'$  Yes    '[x]'$  No Home glucose results range: Diet Adherence: '[x]'$  Yes    '[]'$  No Exercise: '[x]'$  Yes   -rides bike 4-5x/wk for 5 miles Hypoglycemic episodes?: '[]'$  Yes    '[]'$  No Numbness of the feet? '[]'$  Yes    '[x]'$  No Retinopathy hx? '[]'$  Yes    '[]'$  No Last eye exam: up to date Comments:   TSH was mildly elevated on blood test done last visit as part of the evaluation of tachycardia.  He denies any feeling tired all the time.  He does get cold at times.  HM:  Due for c-scope and Tdap.   Outpatient Encounter Medications as of 02/14/2020  Medication Sig  . amLODipine (NORVASC) 10 MG tablet TAKE 1 TABLET EVERY DAY  . aspirin EC 81 MG tablet Take 1 tablet (81 mg total) by mouth daily.  Marland Kitchen atorvastatin (LIPITOR)  40 MG tablet TAKE 1 TABLET EVERY DAY  . Blood Glucose Monitoring Suppl (TRUERESULT BLOOD GLUCOSE) W/DEVICE KIT 1 each by Does not apply route once.  . carvedilol (COREG) 3.125 MG tablet Take 1 tablet (3.125 mg total) by mouth 2 (two) times daily with a meal.  . glucose blood (TRUETEST TEST) test strip Use as instructed  . hydrochlorothiazide (HYDRODIURIL) 25 MG tablet TAKE 1 TABLET EVERY DAY  . Lancet Device MISC 1 each by Does not apply route once.  . metFORMIN (GLUCOPHAGE) 1000 MG tablet Take 1 tablet (1,000 mg total) by mouth 2 (two) times daily with a meal.  . sildenafil (VIAGRA) 100 MG tablet TAKE 1/2 TO 1 TABLET AS NEEDED 1/2 HOUR PRIOR TO INTERCOURSE. LIMIT USE TO 1 TABLET/24 HOURS   No facility-administered encounter medications on file as of 02/14/2020.      Observations/Objective: Results for orders placed or performed in visit on 11/14/19  Microalbumin / creatinine urine ratio  Result Value Ref Range   Creatinine, Urine 202.6 Not Estab. mg/dL   Microalbumin, Urine 108.3 Not Estab. ug/mL   Microalb/Creat Ratio 53 (H) 0 - 29 mg/g creat  Basic Metabolic Panel  Result Value Ref Range   Glucose 149 (H) 65 - 99 mg/dL   BUN 10 8 - 27 mg/dL   Creatinine, Ser 1.44 (H) 0.76 - 1.27 mg/dL   GFR calc non Af Amer 50 (L) >59 mL/min/1.73   GFR calc Af Amer 57 (L) >59 mL/min/1.73   BUN/Creatinine Ratio 7 (L) 10 - 24   Sodium 139 134 - 144 mmol/L   Potassium 4.0 3.5 - 5.2 mmol/L   Chloride 101 96 - 106 mmol/L   CO2 21 20 - 29 mmol/L   Calcium 10.1 8.6 - 10.2 mg/dL  TSH  Result Value Ref Range   TSH 4.920 (H) 0.450 - 4.500 uIU/mL  POCT glucose (manual entry)  Result Value Ref Range   POC Glucose 157 (A) 70 - 99 mg/dl  POCT glycosylated hemoglobin (Hb A1C)  Result Value Ref Range   Hemoglobin A1C     HbA1c POC (<> result, manual entry)     HbA1c, POC (prediabetic range) 6.2 5.7 - 6.4 %   HbA1c, POC (controlled diabetic range)       Assessment and Plan: 1. Type 2 diabetes  mellitus with microalbuminuria, without long-term current use of insulin (HCC) Patient not checking blood sugars but last A1c was at goal.  He is agreeable to coming to the lab to have A1c checked.  Encouraged him to continue healthy eating habits and regular exercise.  Continue Metformin.  Recheck BMP to make sure that GFR is stable - Hemoglobin A1c; Future  2. Essential hypertension Patient reports that his home blood pressure readings have been good.  He will continue amlodipine, hydrochlorothiazide and carvedilol.  Blood pressure goal is 130/80 or lower. - Basic Metabolic Panel; Future  3. Hyperlipidemia associated with type 2 diabetes mellitus (HCC) Continue atorvastatin  4. Abnormal TSH We will recheck thyroid level.  He will come to the lab to have this done - TSH+T4F+T3Free; Future  5. Vasculogenic erectile dysfunction, unspecified vasculogenic erectile dysfunction type Refill given on Viagra - sildenafil (VIAGRA) 100 MG tablet; TAKE 1/2 TO 1 TABLET AS NEEDED 1/2 HOUR PRIOR TO INTERCOURSE. LIMIT USE TO 1 TABLET/24 HOURS  Dispense: 20 tablet; Refill: 2  6. Screening for colon cancer Discussed colon cancer screening methods.  Last colonoscopy was 10 years ago.  He had 2 hyperplastic polyps removed.  I gave him the option of having the Cologuard test but patient prefers to have the colonoscopy again - Ambulatory referral to Gastroenterology  7. Need for Tdap vaccination When he comes to the lab he will have Tdap vaccine given by our clinical pharmacist.   Follow Up Instructions: 4 mth with me Will come next wk for lab test and Tdap vaccine. Appointment given with clinical pharmacist for his Medicare wellness visit in 4 weeks.   I discussed the assessment and treatment plan with the patient. The patient was provided an opportunity to ask questions and all were answered. The patient agreed with the plan and demonstrated an understanding of the instructions.   The patient was  advised to call back or seek an in-person evaluation if the symptoms worsen or if the condition fails to improve as anticipated.  I provided 19 minutes of non-face-to-face time during this encounter.   Karle Plumber, MD

## 2020-02-17 ENCOUNTER — Ambulatory Visit (HOSPITAL_BASED_OUTPATIENT_CLINIC_OR_DEPARTMENT_OTHER): Payer: Medicare HMO | Admitting: Pharmacist

## 2020-02-17 ENCOUNTER — Ambulatory Visit: Payer: Medicare HMO | Attending: Internal Medicine

## 2020-02-17 ENCOUNTER — Other Ambulatory Visit: Payer: Self-pay

## 2020-02-17 DIAGNOSIS — Z23 Encounter for immunization: Secondary | ICD-10-CM

## 2020-02-17 DIAGNOSIS — R7989 Other specified abnormal findings of blood chemistry: Secondary | ICD-10-CM

## 2020-02-17 DIAGNOSIS — R809 Proteinuria, unspecified: Secondary | ICD-10-CM | POA: Diagnosis not present

## 2020-02-17 DIAGNOSIS — I10 Essential (primary) hypertension: Secondary | ICD-10-CM | POA: Diagnosis not present

## 2020-02-17 DIAGNOSIS — E1129 Type 2 diabetes mellitus with other diabetic kidney complication: Secondary | ICD-10-CM

## 2020-02-17 NOTE — Progress Notes (Signed)
Patient presents for vaccination against tetanus per orders of Dr. Johnson. Consent given. Counseling provided. No contraindications exists. Vaccine administered without incident.   

## 2020-02-18 LAB — BASIC METABOLIC PANEL
BUN/Creatinine Ratio: 11 (ref 10–24)
BUN: 13 mg/dL (ref 8–27)
CO2: 21 mmol/L (ref 20–29)
Calcium: 10.2 mg/dL (ref 8.6–10.2)
Chloride: 102 mmol/L (ref 96–106)
Creatinine, Ser: 1.22 mg/dL (ref 0.76–1.27)
GFR calc Af Amer: 70 mL/min/{1.73_m2} (ref >59)
GFR calc non Af Amer: 61 mL/min/{1.73_m2} (ref >59)
Glucose: 74 mg/dL (ref 65–99)
Potassium: 3.7 mmol/L (ref 3.5–5.2)
Sodium: 142 mmol/L (ref 134–144)

## 2020-02-18 LAB — TSH+T4F+T3FREE
Free T4: 1.12 ng/dL (ref 0.82–1.77)
T3, Free: 3 pg/mL (ref 2.0–4.4)
TSH: 1.97 u[IU]/mL (ref 0.450–4.500)

## 2020-02-18 LAB — HEMOGLOBIN A1C
Est. average glucose Bld gHb Est-mCnc: 131 mg/dL
Hgb A1c MFr Bld: 6.2 % — ABNORMAL HIGH (ref 4.8–5.6)

## 2020-02-19 NOTE — Progress Notes (Signed)
Let patient know that his kidney function is much improved compared to 3 months ago.  A1c is 6.2 with goal being less than 7.  This means his diabetes is well controlled.  Repeat thyroid level is normal.

## 2020-03-18 ENCOUNTER — Ambulatory Visit: Payer: Medicare HMO | Attending: Internal Medicine | Admitting: Pharmacist

## 2020-03-18 ENCOUNTER — Other Ambulatory Visit: Payer: Self-pay

## 2020-03-18 ENCOUNTER — Encounter: Payer: Self-pay | Admitting: Pharmacist

## 2020-03-18 VITALS — BP 149/74 | HR 99 | Temp 97.5°F | Ht 73.0 in | Wt 203.6 lb

## 2020-03-18 DIAGNOSIS — Z0000 Encounter for general adult medical examination without abnormal findings: Secondary | ICD-10-CM

## 2020-03-18 DIAGNOSIS — R809 Proteinuria, unspecified: Secondary | ICD-10-CM | POA: Diagnosis not present

## 2020-03-18 DIAGNOSIS — E1129 Type 2 diabetes mellitus with other diabetic kidney complication: Secondary | ICD-10-CM | POA: Diagnosis not present

## 2020-03-18 LAB — GLUCOSE, POCT (MANUAL RESULT ENTRY): POC Glucose: 198 mg/dl — AB (ref 70–99)
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
## 2020-04-02 ENCOUNTER — Other Ambulatory Visit: Payer: Self-pay | Admitting: Internal Medicine

## 2020-06-16 ENCOUNTER — Encounter: Payer: Self-pay | Admitting: Internal Medicine

## 2020-06-16 ENCOUNTER — Ambulatory Visit: Payer: Medicare HMO | Attending: Internal Medicine | Admitting: Internal Medicine

## 2020-06-16 ENCOUNTER — Other Ambulatory Visit: Payer: Self-pay

## 2020-06-16 ENCOUNTER — Ambulatory Visit (HOSPITAL_BASED_OUTPATIENT_CLINIC_OR_DEPARTMENT_OTHER): Payer: Medicare HMO | Admitting: Pharmacist

## 2020-06-16 VITALS — BP 132/80 | HR 89 | Resp 16 | Wt 198.6 lb

## 2020-06-16 DIAGNOSIS — I10 Essential (primary) hypertension: Secondary | ICD-10-CM

## 2020-06-16 DIAGNOSIS — Z23 Encounter for immunization: Secondary | ICD-10-CM

## 2020-06-16 DIAGNOSIS — Z125 Encounter for screening for malignant neoplasm of prostate: Secondary | ICD-10-CM

## 2020-06-16 DIAGNOSIS — R809 Proteinuria, unspecified: Secondary | ICD-10-CM

## 2020-06-16 DIAGNOSIS — R3589 Other polyuria: Secondary | ICD-10-CM | POA: Diagnosis not present

## 2020-06-16 DIAGNOSIS — E785 Hyperlipidemia, unspecified: Secondary | ICD-10-CM

## 2020-06-16 DIAGNOSIS — E1129 Type 2 diabetes mellitus with other diabetic kidney complication: Secondary | ICD-10-CM | POA: Diagnosis not present

## 2020-06-16 DIAGNOSIS — I700 Atherosclerosis of aorta: Secondary | ICD-10-CM | POA: Diagnosis not present

## 2020-06-16 DIAGNOSIS — E1169 Type 2 diabetes mellitus with other specified complication: Secondary | ICD-10-CM

## 2020-06-16 LAB — GLUCOSE, POCT (MANUAL RESULT ENTRY): POC Glucose: 137 mg/dl — AB (ref 70–99)

## 2020-06-16 MED ORDER — CARVEDILOL 3.125 MG PO TABS: 3.1250 mg | ORAL_TABLET | Freq: Two times a day (BID) | ORAL | 3 refills | Status: DC
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
## 2020-06-16 NOTE — Progress Notes (Signed)
Patient presents for vaccination against influenza per orders of Dr. Johnson. Consent given. Counseling provided. No contraindications exists. Vaccine administered without incident.  ° °Luke Van Ausdall, PharmD, CPP °Clinical Pharmacist °[facility] °[phone] ° °

## 2020-06-16 NOTE — Patient Instructions (Addendum)
Try to drink water during the day and less at nights so that you are not having to wake up to urinate often.   Influenza Virus Vaccine injection (Fluarix) What is this medicine? INFLUENZA VIRUS VACCINE (in floo EN zuh VAHY ruhs vak SEEN) helps to reduce the risk of getting influenza also known as the flu. This medicine may be used for other purposes; ask your health care provider or pharmacist if you have questions. COMMON BRAND NAME(S): Fluarix, Fluzone What should I tell my health care provider before I take this medicine? They need to know if you have any of these conditions:  bleeding disorder like hemophilia  fever or infection  Guillain-Barre syndrome or other neurological problems  immune system problems  infection with the human immunodeficiency virus (HIV) or AIDS  low blood platelet counts  multiple sclerosis  an unusual or allergic reaction to influenza virus vaccine, eggs, chicken proteins, latex, gentamicin, other medicines, foods, dyes or preservatives  pregnant or trying to get pregnant  breast-feeding How should I use this medicine? This vaccine is for injection into a muscle. It is given by a health care professional. A copy of Vaccine Information Statements will be given before each vaccination. Read this sheet carefully each time. The sheet may change frequently. Talk to your pediatrician regarding the use of this medicine in children. Special care may be needed. Overdosage: If you think you have taken too much of this medicine contact a poison control center or emergency room at once. NOTE: This medicine is only for you. Do not share this medicine with others. What if I miss a dose? This does not apply. What may interact with this medicine?  chemotherapy or radiation therapy  medicines that lower your immune system like etanercept, anakinra, infliximab, and adalimumab  medicines that treat or prevent blood clots like warfarin  phenytoin  steroid  medicines like prednisone or cortisone  theophylline  vaccines This list may not describe all possible interactions. Give your health care provider a list of all the medicines, herbs, non-prescription drugs, or dietary supplements you use. Also tell them if you smoke, drink alcohol, or use illegal drugs. Some items may interact with your medicine. What should I watch for while using this medicine? Report any side effects that do not go away within 3 days to your doctor or health care professional. Call your health care provider if any unusual symptoms occur within 6 weeks of receiving this vaccine. You may still catch the flu, but the illness is not usually as bad. You cannot get the flu from the vaccine. The vaccine will not protect against colds or other illnesses that may cause fever. The vaccine is needed every year. What side effects may I notice from receiving this medicine? Side effects that you should report to your doctor or health care professional as soon as possible:  allergic reactions like skin rash, itching or hives, swelling of the face, lips, or tongue Side effects that usually do not require medical attention (report to your doctor or health care professional if they continue or are bothersome):  fever  headache  muscle aches and pains  pain, tenderness, redness, or swelling at site where injected  weak or tired This list may not describe all possible side effects. Call your doctor for medical advice about side effects. You may report side effects to FDA at 1-800-FDA-1088. Where should I keep my medicine? This vaccine is only given in a clinic, pharmacy, doctor's office, or other health care  setting and will not be stored at home. NOTE: This sheet is a summary. It may not cover all possible information. If you have questions about this medicine, talk to your doctor, pharmacist, or health care provider.  2020 Elsevier/Gold Standard (2008-03-12 09:30:40)

## 2020-06-17 LAB — CBC
Hematocrit: 40 % (ref 37.5–51.0)
Hemoglobin: 13.6 g/dL (ref 13.0–17.7)
MCH: 31.2 pg (ref 26.6–33.0)
MCHC: 34 g/dL (ref 31.5–35.7)
MCV: 92 fL (ref 79–97)
Platelets: 226 10*3/uL (ref 150–450)
RBC: 4.36 x10E6/uL (ref 4.14–5.80)
RDW: 12.9 % (ref 11.6–15.4)
WBC: 6.4 10*3/uL (ref 3.4–10.8)

## 2020-06-17 LAB — LIPID PANEL
Chol/HDL Ratio: 3.1 ratio (ref 0.0–5.0)
Cholesterol, Total: 161 mg/dL (ref 100–199)
HDL: 52 mg/dL (ref >39)
LDL Chol Calc (NIH): 86 mg/dL (ref 0–99)
Triglycerides: 130 mg/dL (ref 0–149)
VLDL Cholesterol Cal: 23 mg/dL (ref 5–40)

## 2020-06-17 LAB — HEPATIC FUNCTION PANEL
ALT: 20 IU/L (ref 0–44)
AST: 27 IU/L (ref 0–40)
Albumin: 4.3 g/dL (ref 3.8–4.8)
Alkaline Phosphatase: 59 IU/L (ref 44–121)
Bilirubin Total: 0.5 mg/dL (ref 0.0–1.2)
Bilirubin, Direct: 0.14 mg/dL (ref 0.00–0.40)
Total Protein: 7.2 g/dL (ref 6.0–8.5)

## 2020-06-17 LAB — HEMOGLOBIN A1C
Est. average glucose Bld gHb Est-mCnc: 126 mg/dL
Hgb A1c MFr Bld: 6 % — ABNORMAL HIGH (ref 4.8–5.6)

## 2020-06-17 LAB — PSA: Prostate Specific Ag, Serum: 4.6 ng/mL — ABNORMAL HIGH (ref 0.0–4.0)

## 2020-06-17 NOTE — Progress Notes (Signed)
A1C is 6 meaning his DM is under good control. PSA which is the screening test for prostate cancer is slightly elevated.  I recommend that we repeat the level on f/u visit in 4 mths.  If more elevated at that time, will refer to urology. Liver function test normal. LDL cholesterol mildly elevated at 86 with goal being less than 70.  Continue to take Atorvastatin daily as prescribed.   Blood cell counts including red blood cells, white blood cells and platelet counts are normal.

## 2020-06-18 ENCOUNTER — Telehealth: Payer: Self-pay | Admitting: Internal Medicine

## 2020-06-18 NOTE — Telephone Encounter (Signed)
Pt is aware his PSA is elevated and has an appt with dr Wynetta Emery in feb 2022. Pt would like to know if there is anything he could be doing to  Lower his PSA etc ok to leave message on mobile number.

## 2020-06-18 NOTE — Telephone Encounter (Signed)
Left detailed message as requested by pt w/ advisement/education

## 2020-06-19 NOTE — Telephone Encounter (Signed)
Attempted to call patient back:  Notify patient of results from Dr. Wynetta Emery:   A1C is 6 meaning his DM is under good control. PSA which is the screening test for prostate cancer is slightly elevated. I recommend that we repeat the level on f/u visit in 4 mths. If more elevated at that time, will refer to urology. Liver function test normal. LDL cholesterol mildly elevated at 86 with goal being less than 70. Continue to take Atorvastatin daily as prescribed.  Blood cell counts including red blood cells, white blood cells and platelet counts are normal.

## 2020-06-19 NOTE — Telephone Encounter (Signed)
Pt called stating that he did not receive a VM. Please advise .

## 2020-06-22 ENCOUNTER — Other Ambulatory Visit: Payer: Self-pay | Admitting: Internal Medicine

## 2020-06-22 DIAGNOSIS — E785 Hyperlipidemia, unspecified: Secondary | ICD-10-CM

## 2020-06-22 DIAGNOSIS — E1169 Type 2 diabetes mellitus with other specified complication: Secondary | ICD-10-CM

## 2020-06-22 NOTE — Telephone Encounter (Signed)
Approved per protocol.  Requested Prescriptions  Pending Prescriptions Disp Refills  . atorvastatin (LIPITOR) 40 MG tablet [Pharmacy Med Name: ATORVASTATIN CALCIUM 40 MG Tablet] 90 tablet 0    Sig: TAKE 1 TABLET EVERY DAY     Cardiovascular:  Antilipid - Statins Failed - 06/22/2020  6:58 PM      Failed - LDL in normal range and within 360 days    LDL Chol Calc (NIH)  Date Value Ref Range Status  06/16/2020 86 0 - 99 mg/dL Final         Passed - Total Cholesterol in normal range and within 360 days    Cholesterol, Total  Date Value Ref Range Status  06/16/2020 161 100 - 199 mg/dL Final         Passed - HDL in normal range and within 360 days    HDL  Date Value Ref Range Status  06/16/2020 52 >39 mg/dL Final         Passed - Triglycerides in normal range and within 360 days    Triglycerides  Date Value Ref Range Status  06/16/2020 130 0 - 149 mg/dL Final         Passed - Patient is not pregnant      Passed - Valid encounter within last 12 months    Recent Outpatient Visits          6 days ago Need for influenza vaccination   [place], Annie Main L, RPH-CPP   6 days ago Type 2 diabetes mellitus with microalbuminuria, without long-term current use of insulin ([place])   [place], Neoma Laming B, MD   3 months ago Type 2 diabetes mellitus with microalbuminuria, without long-term current use of insulin [facility])   [place], Jarome Matin, RPH-CPP   4 months ago Need for Tdap vaccination   [place], Stephen L, RPH-CPP   4 months ago Type 2 diabetes mellitus with microalbuminuria, without long-term current use of insulin [facility])   [place], MD      Future Appointments            In 3 months Wynetta Emery, Dalbert Batman, MD [place]

## 2020-07-04 ENCOUNTER — Other Ambulatory Visit: Payer: Self-pay | Admitting: Internal Medicine

## 2020-07-13 ENCOUNTER — Other Ambulatory Visit: Payer: Self-pay | Admitting: Internal Medicine

## 2020-07-13 DIAGNOSIS — E119 Type 2 diabetes mellitus without complications: Secondary | ICD-10-CM

## 2020-07-13 MED ORDER — TRUERESULT BLOOD GLUCOSE W/DEVICE KIT: 1.0000 | PACK | Freq: Once | 0 refills | Status: AC

## 2020-07-13 MED ORDER — LANCET DEVICE MISC: 1.0000 | Freq: Once | 5 refills | Status: AC

## 2020-07-13 MED ORDER — TRUETEST TEST VI STRP: ORAL_STRIP | 12 refills | Status: DC

## 2020-07-13 NOTE — Telephone Encounter (Signed)
Medication Refill - Medication: Blood Glucose Monitoring Suppl (TRUERESULT BLOOD GLUCOSE) W/DEVICE  Needs meter, test strips, lancets, alcohol swabs  [employer] faxed over request 07/07/2020  Has the patient contacted their pharmacy? Yes.   (Agent: If no, request that the patient contact the pharmacy for the refill.) (Agent: If yes, when and what did the pharmacy advise?)  Preferred Pharmacy (with phone number or street name):  [place], [address]  [place] [address]  Phone: [phone] Fax: [phone]     Agent: Please be advised that RX refills may take up to 3 business days. We ask that you follow-up with your pharmacy.

## 2020-07-13 NOTE — Telephone Encounter (Signed)
Pt requesting prescription for alcohol swabs. Please send to [facility].

## 2020-07-15 DIAGNOSIS — Z8719 Personal history of other diseases of the digestive system: Secondary | ICD-10-CM | POA: Diagnosis not present

## 2020-07-15 DIAGNOSIS — Z1211 Encounter for screening for malignant neoplasm of colon: Secondary | ICD-10-CM | POA: Diagnosis not present

## 2020-07-15 DIAGNOSIS — K649 Unspecified hemorrhoids: Secondary | ICD-10-CM | POA: Diagnosis not present

## 2020-07-15 DIAGNOSIS — Z9049 Acquired absence of other specified parts of digestive tract: Secondary | ICD-10-CM | POA: Diagnosis not present

## 2020-07-17 ENCOUNTER — Other Ambulatory Visit: Payer: Self-pay | Admitting: Pharmacist

## 2020-07-17 DIAGNOSIS — E1129 Type 2 diabetes mellitus with other diabetic kidney complication: Secondary | ICD-10-CM

## 2020-07-17 MED ORDER — TRUEPLUS LANCETS 33G MISC: 2 refills | Status: DC

## 2020-07-17 MED ORDER — TRUE METRIX METER W/DEVICE KIT: PACK | 0 refills | Status: DC

## 2020-07-17 MED ORDER — TRUE METRIX BLOOD GLUCOSE TEST VI STRP: ORAL_STRIP | 2 refills | Status: DC

## 2020-07-20 ENCOUNTER — Other Ambulatory Visit: Payer: Self-pay | Admitting: Pharmacist

## 2020-07-20 MED ORDER — ACCU-CHEK SOFTCLIX LANCETS MISC: 2 refills | Status: DC

## 2020-08-05 IMAGING — CT CT L SPINE W/O CM
4 of 7 series · 13 of 33 positions shown, 14 images · non-contrast
Comparison: CT abdomen and pelvis 02/14/2011.

CLINICAL DATA: Low back pain since 05/27/2018.  No known injury.

EXAM:
CT LUMBAR SPINE WITHOUT CONTRAST
TECHNIQUE: Multidetector CT imaging of the lumbar spine was performed without
intravenous contrast administration. Multiplanar CT image
reconstructions were also generated.

[Series 3: stone study 5.0 i30f 2 · axial · 0.76mm/px · z∈[+1052,+1282]mm · 3 of 94 slices shown, 4 images]
[im 24/94  soft-tissue]
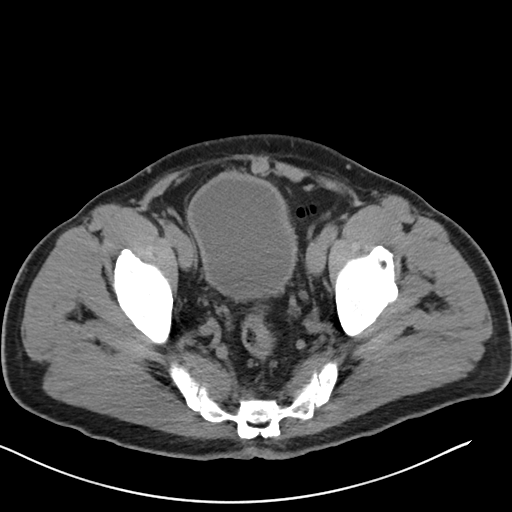
[im 24/94  bone]
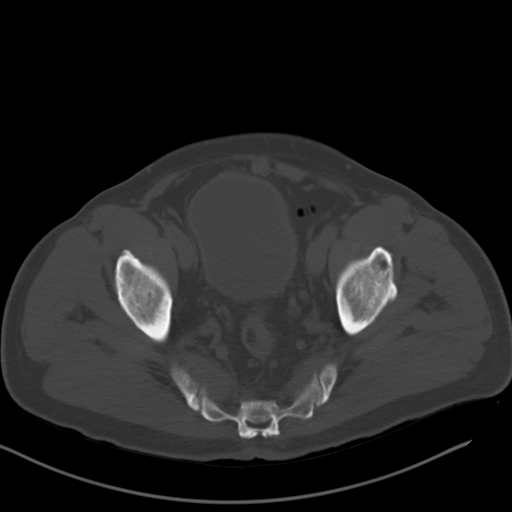
[im 47/94  bone]
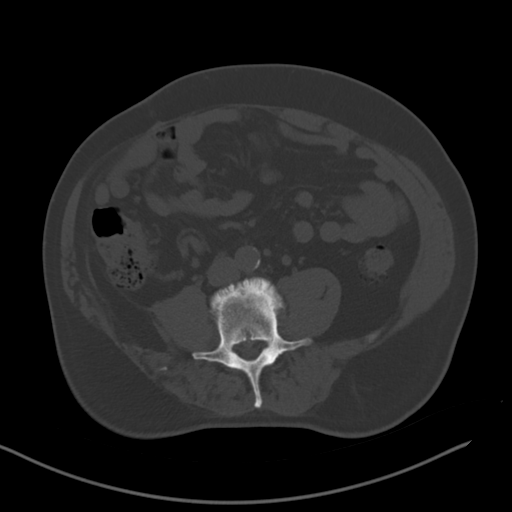
[im 70/94  bone]
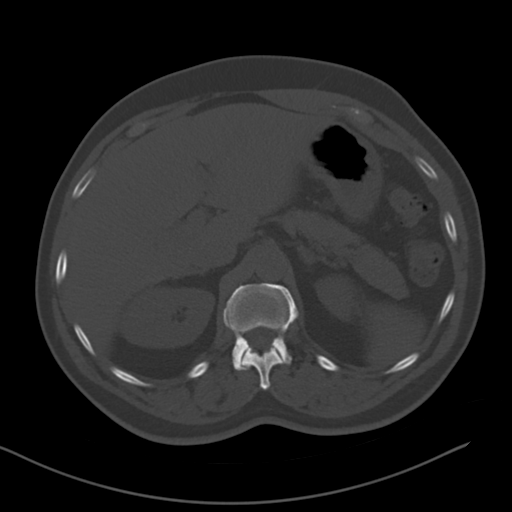

[Series 9: lspine st axial · axial · 0.29mm/px · z∈[+1105,+1183]mm · 3 of 118 slices shown]
[im 20/118  bone]
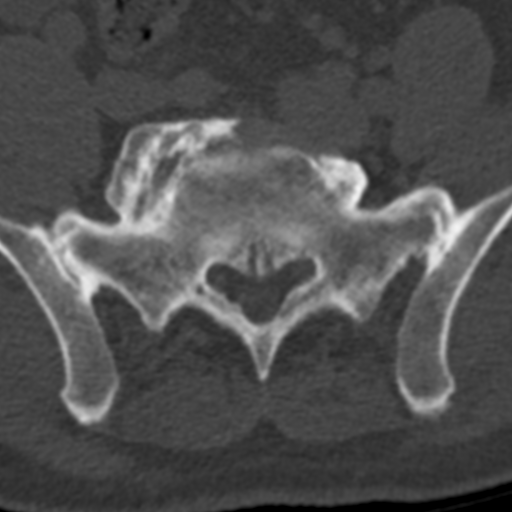
[im 40/118  bone]
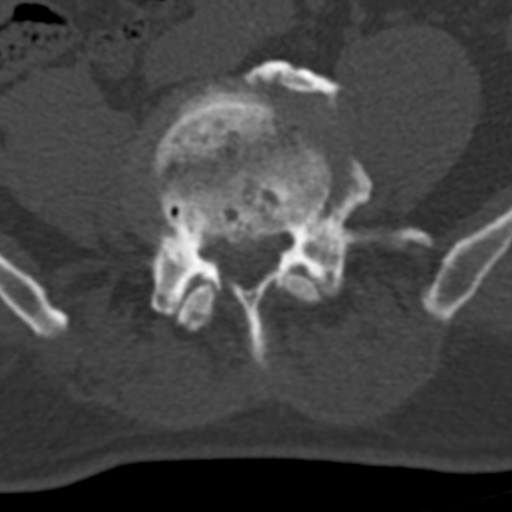
[im 59/118  bone]
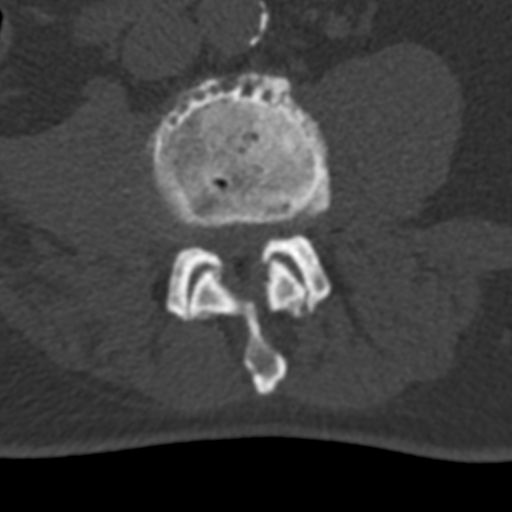

[Series 10: lspine bone axial · coronal · 0.29mm/px · 2 of 215 slices shown (1 of 2)]
[im 72/215  bone]
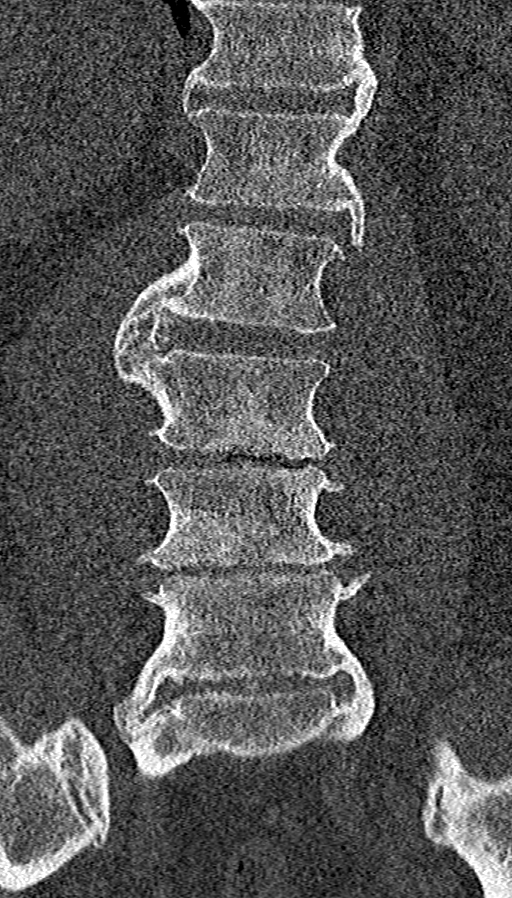
[im 143/215  bone]
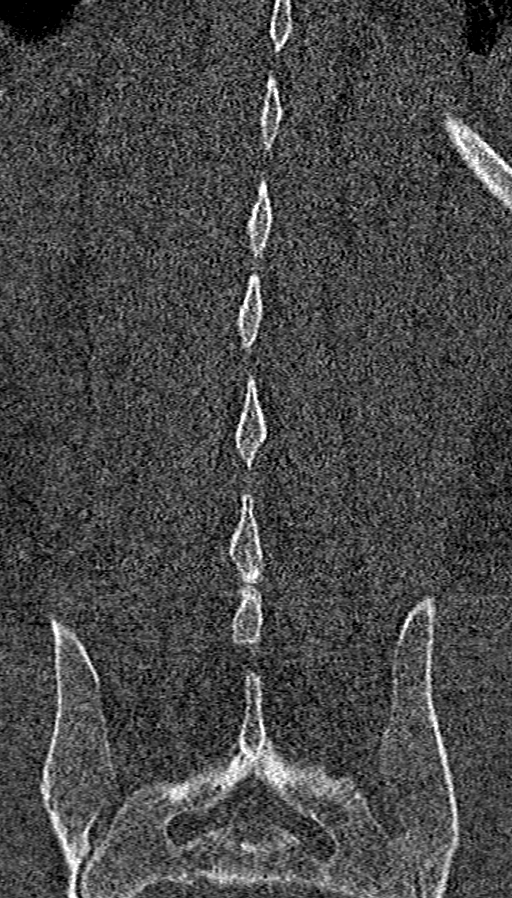

[Series 11: lspine bone axial · sagittal · 0.35mm/px · 5 of 238 slices shown (2 of 2)]
[im 60/238  bone]
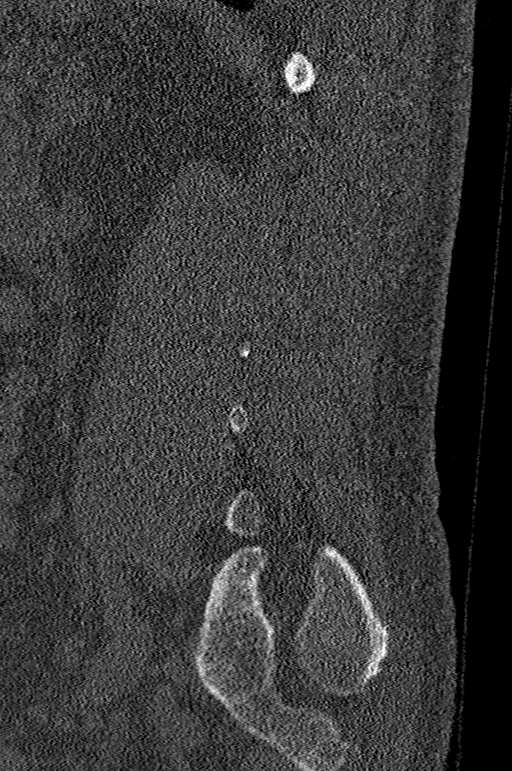
[im 89/238  bone]
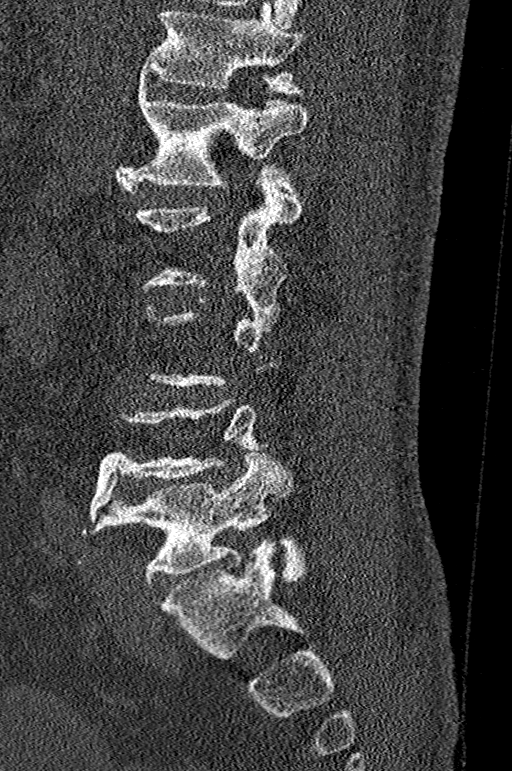
[im 119/238  bone]
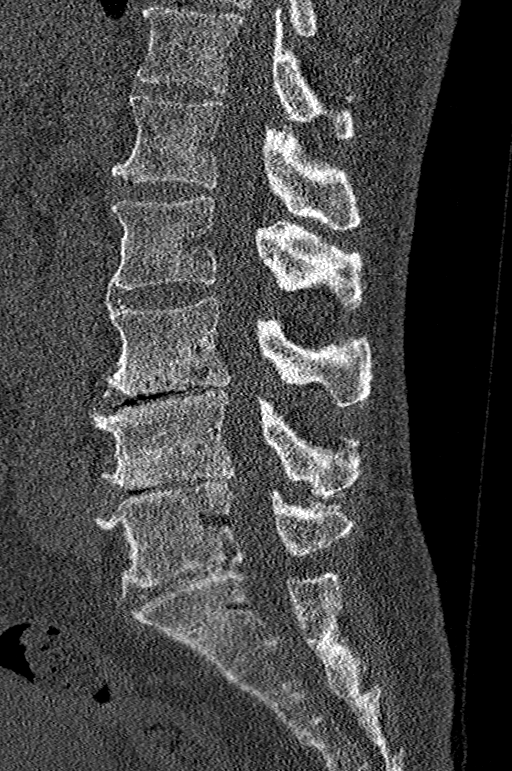
[im 149/238  bone]
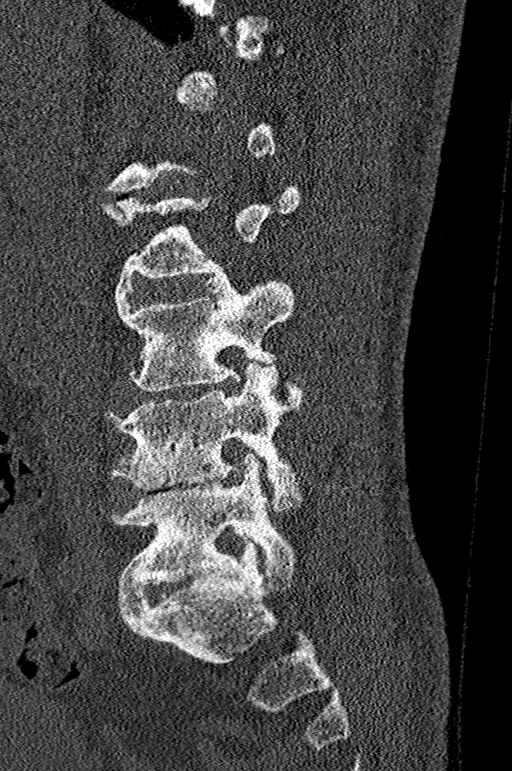
[im 178/238  bone]
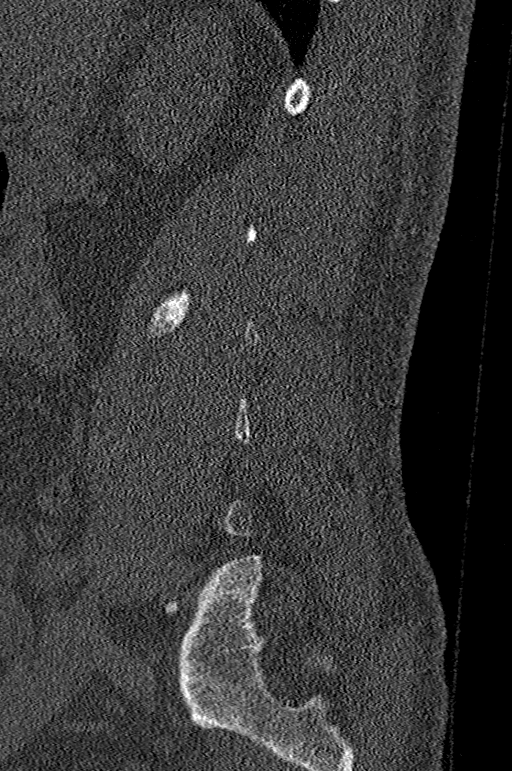

[13 of 33 positions shown; findings below may reference images not displayed]

FINDINGS: Segmentation: Standard.

Alignment: Maintained.

Vertebrae: No fracture or worrisome lesion.

Paraspinal and other soft tissues: See report of dedicated CT
abdomen and pelvis this same day. Degenerative ankylosis of the
sacroiliac joints is noted.

Disc levels: T12-L1: Mild-to-moderate facet arthropathy is worse on
the right. No disc bulge or protrusion is identified. The central
canal and foramina appear open.

L1-2: Mild loss of disc space height and vacuum disc phenomenon.
Shallow disc bulge, ligamentum flavum thickening and mild to
moderate facet degenerative change are seen. There is mild central
canal narrowing. The foramina appear open.

L2-3: Mild-to-moderate facet arthropathy is worse on the left.
Bridging endplate spur is seen on the right. No bulge or protrusion.
The central canal is open. Mild to moderate bilateral foraminal
narrowing is seen.

L3-4: Marked loss of disc space height with vacuum disc phenomenon.
Moderate facet degenerative change is worse on the left. There is a
shallow broad-based disc bulge and some ligamentum flavum
thickening. Moderate central canal stenosis is seen. Severe left
foraminal narrowing is predominantly due to osteophytosis off the
left facets. There is mild to moderate right foraminal narrowing.

L4-5: Marked loss of disc space height with vacuum disc phenomenon.
Partial autologous fusion across the posterior margin of the disc
interspace is identified. There is a shallow bulge with endplate
spur. The central canal is mildly narrowed. Mild to moderate
foraminal narrowing appears worse on the left.

L5-S1: Partial autologous fusion across the disc interspace is
identified. Shallow bulge with endplate spur is seen. There appears
to be mild narrowing in the right lateral recess and mild bilateral
foraminal narrowing.
IMPRESSION: No acute abnormality.

Multilevel spondylosis as described above appears worst at L3-4
where there is severe left foraminal narrowing and moderate central
canal stenosis. Please see above for descriptions of individual
levels.

## 2020-09-04 ENCOUNTER — Other Ambulatory Visit: Payer: Self-pay | Admitting: Internal Medicine

## 2020-09-04 DIAGNOSIS — E785 Hyperlipidemia, unspecified: Secondary | ICD-10-CM

## 2020-09-04 DIAGNOSIS — E1169 Type 2 diabetes mellitus with other specified complication: Secondary | ICD-10-CM

## 2020-09-04 DIAGNOSIS — I10 Essential (primary) hypertension: Secondary | ICD-10-CM

## 2020-09-09 ENCOUNTER — Other Ambulatory Visit: Payer: Self-pay | Admitting: Gastroenterology

## 2020-09-09 DIAGNOSIS — Z1159 Encounter for screening for other viral diseases: Secondary | ICD-10-CM | POA: Diagnosis not present

## 2020-09-09 MED FILL — PEG 3350-ELECTROLYTE SOLN: 420 | 1 days supply | Qty: 4000 | Fill #0

## 2020-10-19 ENCOUNTER — Telehealth: Payer: Medicare HMO | Admitting: Internal Medicine

## 2020-10-19 DIAGNOSIS — Z01812 Encounter for preprocedural laboratory examination: Secondary | ICD-10-CM | POA: Diagnosis not present

## 2020-10-21 ENCOUNTER — Other Ambulatory Visit: Payer: Self-pay | Admitting: Internal Medicine

## 2020-10-21 DIAGNOSIS — E1129 Type 2 diabetes mellitus with other diabetic kidney complication: Secondary | ICD-10-CM

## 2020-10-22 DIAGNOSIS — K5730 Diverticulosis of large intestine without perforation or abscess without bleeding: Secondary | ICD-10-CM | POA: Diagnosis not present

## 2020-10-22 DIAGNOSIS — D124 Benign neoplasm of descending colon: Secondary | ICD-10-CM | POA: Diagnosis not present

## 2020-10-22 DIAGNOSIS — Z1211 Encounter for screening for malignant neoplasm of colon: Secondary | ICD-10-CM | POA: Diagnosis not present

## 2020-10-22 DIAGNOSIS — K635 Polyp of colon: Secondary | ICD-10-CM | POA: Diagnosis not present

## 2020-10-22 DIAGNOSIS — K648 Other hemorrhoids: Secondary | ICD-10-CM | POA: Diagnosis not present

## 2020-10-22 DIAGNOSIS — D122 Benign neoplasm of ascending colon: Secondary | ICD-10-CM | POA: Diagnosis not present

## 2020-10-27 DIAGNOSIS — D124 Benign neoplasm of descending colon: Secondary | ICD-10-CM | POA: Diagnosis not present

## 2020-10-27 DIAGNOSIS — D122 Benign neoplasm of ascending colon: Secondary | ICD-10-CM | POA: Diagnosis not present

## 2020-10-27 DIAGNOSIS — K635 Polyp of colon: Secondary | ICD-10-CM | POA: Diagnosis not present

## 2020-11-02 ENCOUNTER — Other Ambulatory Visit: Payer: Self-pay

## 2020-11-02 ENCOUNTER — Ambulatory Visit (HOSPITAL_BASED_OUTPATIENT_CLINIC_OR_DEPARTMENT_OTHER): Payer: Medicare HMO | Admitting: Internal Medicine

## 2020-11-02 DIAGNOSIS — N529 Male erectile dysfunction, unspecified: Secondary | ICD-10-CM

## 2020-11-02 DIAGNOSIS — Z5329 Procedure and treatment not carried out because of patient's decision for other reasons: Secondary | ICD-10-CM

## 2020-11-02 MED ORDER — SILDENAFIL CITRATE 100 MG PO TABS: ORAL_TABLET | ORAL | 2 refills | Status: DC

## 2020-11-02 NOTE — Progress Notes (Signed)
Pt no showed appt This was supposed to be a telephone visit at 8:30 AM.  Patient was called at 8:36, 8:38 and 8:43 AM.  I left message for him on his home phone and cell phone.  Patient called back later in the morning stating that he had overslept and to call him again.  I tried calling him at 11:41 AM and got his voicemail.  We will have him reschedule.

## 2020-11-02 NOTE — Telephone Encounter (Signed)
Copied from [place] [id number]. Topic: Quick Communication - Rx Refill/Question >> Nov 02, 2020  8:58 AM Tessa Lerner A wrote: Medication: sildenafil (VIAGRA) 100 MG tablet   Has the patient contacted their pharmacy? Patient was instructed to contact PCP before making contact with Pharmacy  Preferred Pharmacy (with phone number or street name): [place], [place]  Phone:  [phone]  Agent: Please be advised that RX refills may take up to 3 business days. We ask that you follow-up with your pharmacy.

## 2020-11-03 ENCOUNTER — Telehealth: Payer: Self-pay | Admitting: Internal Medicine

## 2020-11-03 NOTE — Telephone Encounter (Signed)
Called patient on both numbers on file and LVM to return call and reschedule his appt.

## 2020-11-03 NOTE — Telephone Encounter (Signed)
-----   Message from Ladell Pier, MD sent at 11/02/2020 11:42 AM EST ----- Regarding: please reschedule his appt for next available.

## 2020-11-04 ENCOUNTER — Encounter: Payer: Self-pay | Admitting: Internal Medicine

## 2020-11-04 NOTE — Progress Notes (Signed)
Patient had colonoscopy done by Dr. Cheyenne Adas on 225/22.  He was found to have a large abrasion on perianal exam.  He had 4 polyps removed the largest of which was 6 mm in size.  He had internal hemorrhoids.  He had diverticulosis in the sigmoid colon.  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

## 2020-11-19 ENCOUNTER — Other Ambulatory Visit: Payer: Self-pay | Admitting: Internal Medicine

## 2020-11-19 DIAGNOSIS — E785 Hyperlipidemia, unspecified: Secondary | ICD-10-CM

## 2020-11-19 DIAGNOSIS — E1169 Type 2 diabetes mellitus with other specified complication: Secondary | ICD-10-CM
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
## 2020-11-19 NOTE — Telephone Encounter (Signed)
Requested Prescriptions  Pending Prescriptions Disp Refills  . atorvastatin (LIPITOR) 40 MG tablet [Pharmacy Med Name: ATORVASTATIN CALCIUM 40 MG Tablet] 90 tablet 0    Sig: TAKE 1 TABLET EVERY DAY     Cardiovascular:  Antilipid - Statins Failed - 11/19/2020  5:50 AM      Failed - LDL in normal range and within 360 days    LDL Chol Calc (NIH)  Date Value Ref Range Status  06/16/2020 86 0 - 99 mg/dL Final         Passed - Total Cholesterol in normal range and within 360 days    Cholesterol, Total  Date Value Ref Range Status  06/16/2020 161 100 - 199 mg/dL Final         Passed - HDL in normal range and within 360 days    HDL  Date Value Ref Range Status  06/16/2020 52 >39 mg/dL Final         Passed - Triglycerides in normal range and within 360 days    Triglycerides  Date Value Ref Range Status  06/16/2020 130 0 - 149 mg/dL Final         Passed - Patient is not pregnant      Passed - Valid encounter within last 12 months    Recent Outpatient Visits          2 weeks ago No-show for appointment   Crocker, Deborah B, MD   5 months ago Need for influenza vaccination   [place], Annie Main L, RPH-CPP   5 months ago Type 2 diabetes mellitus with microalbuminuria, without long-term current use of insulin [facility])   [place] Karle Plumber B, MD   8 months ago Type 2 diabetes mellitus with microalbuminuria, without long-term current use of insulin [facility])   [place], Jarome Matin, RPH-CPP   9 months ago Need for Tdap vaccination   Strong, RPH-CPP
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
## 2020-11-24 ENCOUNTER — Ambulatory Visit: Payer: Self-pay | Admitting: Surgery

## 2020-11-24 DIAGNOSIS — K642 Third degree hemorrhoids: Secondary | ICD-10-CM | POA: Diagnosis not present

## 2020-11-24 DIAGNOSIS — K56600 Partial intestinal obstruction, unspecified as to cause: Secondary | ICD-10-CM | POA: Diagnosis not present

## 2020-11-24 DIAGNOSIS — K6289 Other specified diseases of anus and rectum: Secondary | ICD-10-CM | POA: Diagnosis not present
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
## 2020-11-25 ENCOUNTER — Other Ambulatory Visit: Payer: Self-pay | Admitting: Internal Medicine

## 2020-11-25 NOTE — Telephone Encounter (Signed)
Requested Prescriptions  Pending Prescriptions Disp Refills  . hydrochlorothiazide (HYDRODIURIL) 25 MG tablet [Pharmacy Med Name: HYDROCHLOROTHIAZIDE 25 MG Tablet] 90 tablet 0    Sig: TAKE 1 TABLET EVERY DAY     Cardiovascular: Diuretics - Thiazide Passed - 11/25/2020  6:26 AM      Passed - Ca in normal range and within 360 days    Calcium  Date Value Ref Range Status  02/17/2020 10.2 8.6 - 10.2 mg/dL Final   Calcium, Ion  Date Value Ref Range Status  07/11/2010 1.00 (L) 1.12 - 1.32 mmol/L Final         Passed - Cr in normal range and within 360 days    Creat  Date Value Ref Range Status  06/09/2016 1.29 (H) 0.70 - 1.25 mg/dL Final    Comment:      For patients > or = 70 years of age: The upper reference limit for Creatinine is approximately 13% higher for people identified as African-American.      Creatinine, Ser  Date Value Ref Range Status  02/17/2020 1.22 0.76 - 1.27 mg/dL Final         Passed - K in normal range and within 360 days    Potassium  Date Value Ref Range Status  02/17/2020 3.7 3.5 - 5.2 mmol/L Final         Passed - Na in normal range and within 360 days    Sodium  Date Value Ref Range Status  02/17/2020 142 134 - 144 mmol/L Final         Passed - Last BP in normal range    BP Readings from Last 1 Encounters:  06/16/20 132/80         Passed - Valid encounter within last 6 months    Recent Outpatient Visits          3 weeks ago No-show for appointment   [place] Ladell Pier, MD   5 months ago Need for influenza vaccination   [place], Annie Main L, RPH-CPP   5 months ago Type 2 diabetes mellitus with microalbuminuria, without long-term current use of insulin [facility])   [place] Karle Plumber B, MD   8 months ago Type 2 diabetes mellitus with microalbuminuria, without long-term current use of insulin [facility])   [place], Jarome Matin, RPH-CPP   9 months ago Need for Tdap vaccination   [place], RPH-CPP

## 2020-12-17 ENCOUNTER — Other Ambulatory Visit: Payer: Self-pay | Admitting: Internal Medicine

## 2020-12-17 DIAGNOSIS — I10 Essential (primary) hypertension: Secondary | ICD-10-CM

## 2020-12-17 NOTE — Telephone Encounter (Signed)
Requested Prescriptions  Pending Prescriptions Disp Refills  . amLODipine (NORVASC) 10 MG tablet [Pharmacy Med Name: AMLODIPINE BESYLATE 10 MG Tablet] 30 tablet 0    Sig: TAKE 1 TABLET EVERY DAY     Cardiovascular:  Calcium Channel Blockers Failed - 12/17/2020  3:09 PM      Failed - Valid encounter within last 6 months    Recent Outpatient Visits          1 month ago No-show for appointment   [place] Ladell Pier, MD   6 months ago Need for influenza vaccination   [place], Annie Main L, RPH-CPP   6 months ago Type 2 diabetes mellitus with microalbuminuria, without long-term current use of insulin [facility])   [place] Karle Plumber B, MD   9 months ago Type 2 diabetes mellitus with microalbuminuria, without long-term current use of insulin [facility])   [place], Annie Main L, RPH-CPP   10 months ago Need for Tdap vaccination   [place], RPH-CPP             Passed - Last BP in normal range    BP Readings from Last 1 Encounters:  06/16/20 132/80

## 2020-12-29 ENCOUNTER — Ambulatory Visit: Payer: Medicare HMO | Attending: Internal Medicine | Admitting: Internal Medicine

## 2020-12-29 ENCOUNTER — Other Ambulatory Visit: Payer: Self-pay

## 2020-12-29 ENCOUNTER — Encounter: Payer: Self-pay | Admitting: Internal Medicine

## 2020-12-29 VITALS — BP 132/79

## 2020-12-29 DIAGNOSIS — R809 Proteinuria, unspecified: Secondary | ICD-10-CM | POA: Diagnosis not present

## 2020-12-29 DIAGNOSIS — K6289 Other specified diseases of anus and rectum: Secondary | ICD-10-CM

## 2020-12-29 DIAGNOSIS — I152 Hypertension secondary to endocrine disorders: Secondary | ICD-10-CM | POA: Diagnosis not present

## 2020-12-29 DIAGNOSIS — I700 Atherosclerosis of aorta: Secondary | ICD-10-CM | POA: Diagnosis not present

## 2020-12-29 DIAGNOSIS — E1159 Type 2 diabetes mellitus with other circulatory complications: Secondary | ICD-10-CM | POA: Diagnosis not present

## 2020-12-29 DIAGNOSIS — R972 Elevated prostate specific antigen [PSA]: Secondary | ICD-10-CM

## 2020-12-29 DIAGNOSIS — E1129 Type 2 diabetes mellitus with other diabetic kidney complication: Secondary | ICD-10-CM

## 2020-12-29 NOTE — Progress Notes (Signed)
Virtual Visit via Telephone Note  I connected with Logan Knight on 12/29/2020 at 9:56 a.m by telephone and verified that I am speaking with the correct person using two identifiers.  Location: Patient: home Provider: office  Participants: Myself Patient CMA: Ms. [name]   I discussed the limitations, risks, security and privacy concerns of performing an evaluation and management service by telephone and the availability of in person appointments. I also discussed with the patient that there may be a patient responsible charge related to this service. The patient expressed understanding and agreed to proceed.  History of Present Illness: Hx of DMwith microalbumin, HTNwith hypertensive retinopathy, HL, aortic atherosclerosis, OA LT kneeand ED. Last eval 05/2020.  Today's visit is for chronic disease management.  Since last visit with me, patient had colonoscopy by Dr. [name] in 09/2020.  4 polyps removed, 2 were tubular adenomas. Large prolapsing mass noted around the rectum of concern.  Referred to Dr. [name] who did exam and recommended removal to r/o anal CA.  Saw the surgeon in March but held off on moving forward with having the procedure done because he wanted to speak with me first about it.  Wanted to know whether I think it is necessary.  PSA: on last visit PSA was 4.6.  1 yr prior it was 2.6 He is passing his urine okay.  No blood in the urine.  DIABETES TYPE 2 Last A1C:   Lab Results  Component Value Date   HGBA1C 6.0 (H) 06/16/2020   Med Adherence:  [x]  Yes.  He is on metformin.    []  No Medication side effects:  []  Yes    [x]  No Home Monitoring?  []  Yes    [x]  No - has new supplies from his insurance but not sure how to use it. 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 [x]  Yes    []  No Retinopathy hx? []  Yes    []  No Last eye exam: up to date Comments:   HYPERTENSION Currently taking: see medication list.  He is on carvedilol, amlodipine and hydrochlorothiazide Med Adherence: [x]  Yes    []  No Medication side effects: []  Yes    [x]  No Adherence with salt restriction: [x]  Yes    []  No Home Monitoring?: []  Yes    [x]  No but does have a device Monitoring Frequency: []  Yes    []  No Home BP results range:  BP this a.m was 132/75 SOB? []  Yes    [x]  No Chest Pain?: []  Yes    [x]  No Leg swelling?: []  Yes    [x]  No Headaches?: []  Yes    [x]  No Dizziness? []  Yes    [x]  No Comments:   HL/aortic atherosclerosis: Compliant with taking aspirin and atorvastatin.  Tolerating atorvastatin without muscle aches.   Current Outpatient Medications on File Prior to Visit  Medication Sig Dispense Refill  . Accu-Chek Softclix Lancets lancets Check blood sugar once daily. Dx: E11.29 100 each 2  . amLODipine (NORVASC) 10 MG tablet TAKE 1 TABLET EVERY DAY 30 tablet 0  . aspirin EC 81 MG tablet Take 1 tablet (81 mg total) by mouth daily. 30 tablet 11  . atorvastatin (LIPITOR) 40 MG tablet TAKE 1  TABLET EVERY DAY 90 tablet 0  . Blood Glucose Monitoring Suppl (TRUE METRIX METER) w/Device KIT USE AS DIRECTED 1 kit 0  . carvedilol (COREG) 3.125 MG tablet Take 1 tablet (3.125 mg total) by mouth 2 (two) times daily with a meal. 180 tablet 3  . glucose blood (TRUE METRIX BLOOD GLUCOSE TEST) test strip Check blood sugar once daily. Dx: E11.29 100 each 2  . hydrochlorothiazide (HYDRODIURIL) 25 MG tablet TAKE 1 TABLET EVERY DAY 90 tablet 0  . metFORMIN (GLUCOPHAGE) 1000 MG tablet TAKE 1 TABLET (1,000 MG TOTAL) BY MOUTH 2 (TWO) TIMES DAILY WITH A MEAL. 180 tablet 3  . polyethylene glycol-electrolytes (NULYTELY) 420 g solution USE AS DIRECTED 4000 mL 0  . sildenafil (VIAGRA) 100 MG tablet TAKE 1/2 TO 1 TABLET AS NEEDED 1/2 HOUR PRIOR TO INTERCOURSE. LIMIT  USE TO 1 TABLET/24 HOURS 20 tablet 2   No current facility-administered medications on file prior to visit.       Observations/Objective: Results for orders placed or performed in visit on 06/16/20  Hepatic function panel  Result Value Ref Range   Total Protein 7.2 6.0 - 8.5 g/dL   Albumin 4.3 3.8 - 4.8 g/dL   Bilirubin Total 0.5 0.0 - 1.2 mg/dL   Bilirubin, Direct 0.14 0.00 - 0.40 mg/dL   Alkaline Phosphatase 59 44 - 121 IU/L   AST 27 0 - 40 IU/L   ALT 20 0 - 44 IU/L  Lipid panel  Result Value Ref Range   Cholesterol, Total 161 100 - 199 mg/dL   Triglycerides 130 0 - 149 mg/dL   HDL 52 >39 mg/dL   VLDL Cholesterol Cal 23 5 - 40 mg/dL   LDL Chol Calc (NIH) 86 0 - 99 mg/dL   Chol/HDL Ratio 3.1 0.0 - 5.0 ratio  PSA  Result Value Ref Range   Prostate Specific Ag, Serum 4.6 (H) 0.0 - 4.0 ng/mL  Hemoglobin A1c  Result Value Ref Range   Hgb A1c MFr Bld 6.0 (H) 4.8 - 5.6 %   Est. average glucose Bld gHb Est-mCnc 126 mg/dL  CBC  Result Value Ref Range   WBC 6.4 3.4 - 10.8 x10E3/uL   RBC 4.36 4.14 - 5.80 x10E6/uL   Hemoglobin 13.6 13.0 - 17.7 g/dL   Hematocrit 40.0 37.5 - 51.0 %   MCV 92 79 - 97 fL   MCH 31.2 26.6 - 33.0 pg   MCHC 34.0 31.5 - 35.7 g/dL   RDW 12.9 11.6 - 15.4 %   Platelets 226 150 - 450 x10E3/uL  POCT glucose (manual entry)  Result Value Ref Range   POC Glucose 137 (A) 70 - 99 mg/dl     Assessment and Plan: 1. Type 2 diabetes mellitus with microalbuminuria, without long-term current use of insulin (HCC) Last A1c was at goal.  Encouraged him to continue healthy eating habits and moving as much as he can.  Continue metformin.  Appointment to be made with clinical pharmacist for him to be taught how to use his glucose monitoring device. - Microalbumin / creatinine urine ratio; Future - Hemoglobin A1c; Future  2. Hypertension associated with diabetes ([place]) Blood pressure today is close to goal.  He will continue current medications and low-salt diet.   Advised to check blood pressure at least once a week with goal being 130/80 or lower  3. Elevated PSA PSA mildly elevated 6 months ago.  Patient asymptomatic at this time.  Agreeable to having his PSA level rechecked.  If  it has increased more, we will refer to urology.  However patient told that mild increase can be seen with increased age. - PSA; Future  4. Rectal mass I did receive note from the gastroenterologist.  I have also reviewed Dr. Clyda Greener note.  Advised patient that if there is concern to rule out anal cancer, then I would suggest having the procedure done which should be low risk. - Ambulatory referral to General Surgery  5. Aortic atherosclerosis (HCC) Continue aspirin and statin therapy.  Follow Up Instructions: In July for AWV   I discussed the assessment and treatment plan with the patient. The patient was provided an opportunity to ask questions and all were answered. The patient agreed with the plan and demonstrated an understanding of the instructions.   The patient was advised to call back or seek an in-person evaluation if the symptoms worsen or if the condition fails to improve as anticipated.  I  Spent 20 minutes on this telephone encounter  Karle Plumber, MD

## 2021-01-05 ENCOUNTER — Other Ambulatory Visit: Payer: Self-pay

## 2021-01-05 ENCOUNTER — Ambulatory Visit: Payer: Medicare HMO | Attending: Internal Medicine | Admitting: Pharmacist

## 2021-01-05 ENCOUNTER — Ambulatory Visit: Payer: Medicare HMO

## 2021-01-05 DIAGNOSIS — R972 Elevated prostate specific antigen [PSA]: Secondary | ICD-10-CM

## 2021-01-05 DIAGNOSIS — R809 Proteinuria, unspecified: Secondary | ICD-10-CM

## 2021-01-05 DIAGNOSIS — E1129 Type 2 diabetes mellitus with other diabetic kidney complication: Secondary | ICD-10-CM

## 2021-01-05 MED ORDER — TRUE METRIX BLOOD GLUCOSE TEST VI STRP: ORAL_STRIP | 4 refills | Status: DC

## 2021-01-05 NOTE — Progress Notes (Signed)
Patient was educated on the use of the True Metrix blood glucose meter. Reviewed necessary supplies and operation of the meter. Also reviewed goal blood glucose levels. Patient was able to demonstrate use. All questions and concerns were addressed.  Time spent counseling: 30 minutes  Follow-up for Pharmacy visit in 1 month.  Harriet Pho, PharmD PGY-1 [facility] Pharmacy Resident   01/05/2021 10:38 AM

## 2021-01-06 ENCOUNTER — Other Ambulatory Visit: Payer: Self-pay | Admitting: Internal Medicine

## 2021-01-06 ENCOUNTER — Telehealth: Payer: Self-pay

## 2021-01-06 DIAGNOSIS — R972 Elevated prostate specific antigen [PSA]: Secondary | ICD-10-CM

## 2021-01-06 LAB — HEMOGLOBIN A1C
Est. average glucose Bld gHb Est-mCnc: 140 mg/dL
Hgb A1c MFr Bld: 6.5 % — ABNORMAL HIGH (ref 4.8–5.6)

## 2021-01-06 LAB — MICROALBUMIN / CREATININE URINE RATIO
Creatinine, Urine: 209.1 mg/dL
Microalb/Creat Ratio: 27 mg/g creat (ref 0–29)
Microalbumin, Urine: 56.2 ug/mL

## 2021-01-06 LAB — PSA: Prostate Specific Ag, Serum: 5.7 ng/mL — ABNORMAL HIGH (ref 0.0–4.0)

## 2021-01-06 NOTE — Telephone Encounter (Signed)
Contacted pt to go over lab results pt didn't answer lvm  

## 2021-01-06 NOTE — Progress Notes (Signed)
Let patient know that his A1c is 6.5 which indicates that his diabetes is under good control.  The PSA level which is the screening test for prostate cancer is still mildly elevated.  I will refer him to the urologist for further evaluation of this.

## 2021-01-20 ENCOUNTER — Other Ambulatory Visit: Payer: Self-pay | Admitting: Internal Medicine

## 2021-01-20 NOTE — Telephone Encounter (Signed)
No future visit

## 2021-02-01 ENCOUNTER — Other Ambulatory Visit: Payer: Self-pay | Admitting: Internal Medicine

## 2021-02-01 DIAGNOSIS — E785 Hyperlipidemia, unspecified: Secondary | ICD-10-CM

## 2021-02-01 DIAGNOSIS — E1169 Type 2 diabetes mellitus with other specified complication: Secondary | ICD-10-CM

## 2021-02-05 ENCOUNTER — Ambulatory Visit: Payer: Medicare HMO | Admitting: Pharmacist

## 2021-02-07 ENCOUNTER — Other Ambulatory Visit: Payer: Self-pay | Admitting: Internal Medicine

## 2021-02-07 NOTE — Telephone Encounter (Signed)
Last RF 11/25/20 #90

## 2021-02-22 ENCOUNTER — Encounter (HOSPITAL_COMMUNITY): Admission: RE | Admit: 2021-02-22 | Payer: Medicare HMO | Source: Ambulatory Visit

## 2021-03-02 NOTE — Progress Notes (Signed)
LEFT PATIENT VOICEMAIL MESSAGE WITH ARRIVE 1015 AM 03-03-2021 [facility] [address] [place] AND TO TAKE AMLODIPINE , ATORVASTATIN AND CARVEDILOL IF TAKE THESE MEDS IN AM AND TO BRING ALL MEDICATIONS AM OF SURGERY AND HAVE DRIVER CAREIVER ARRANGED FOR DAY OF SURGERY AND TO CALL [phone] IF CANNOT ARRIVE 1015 AM ON 03-03-2021, NO SOLID FOOD AFTER MIDNIGHT MAY HAVE WATER FROM MIDNIGHT UNTIL 915 AM THEN NPO.  LABS NEEDED PER ANESTHESIA GUIDELINES ARE I STAT AND EKG, ODRERS ENTERED INTO Epic FOR I STAT AND EKG.   UNABLE TO DO MEDICAL HISTORY AND INSTRUCTIONS FOR 03-03-2021 SURGERY, PT LEFT VOICEMAIL MESSAGE X 7 TO CALL PRE OP RN

## 2021-03-03 ENCOUNTER — Ambulatory Visit (HOSPITAL_BASED_OUTPATIENT_CLINIC_OR_DEPARTMENT_OTHER): Payer: Medicare HMO | Admitting: Physician Assistant

## 2021-03-03 ENCOUNTER — Encounter (HOSPITAL_BASED_OUTPATIENT_CLINIC_OR_DEPARTMENT_OTHER): Payer: Self-pay | Admitting: Surgery

## 2021-03-03 ENCOUNTER — Encounter (HOSPITAL_BASED_OUTPATIENT_CLINIC_OR_DEPARTMENT_OTHER)
Admission: RE | Disposition: A | Discharge status: Home / Self Care | Payer: Self-pay | Source: Home / Self Care | Attending: Surgery

## 2021-03-03 ENCOUNTER — Ambulatory Visit (HOSPITAL_BASED_OUTPATIENT_CLINIC_OR_DEPARTMENT_OTHER)
Admission: RE | Admit: 2021-03-03 | Discharge: 2021-03-03 | Disposition: A | Discharge status: Home / Self Care | Payer: Medicare HMO | Attending: Surgery | Admitting: Surgery

## 2021-03-03 DIAGNOSIS — K643 Fourth degree hemorrhoids: Secondary | ICD-10-CM | POA: Diagnosis not present

## 2021-03-03 DIAGNOSIS — I10 Essential (primary) hypertension: Secondary | ICD-10-CM | POA: Diagnosis not present

## 2021-03-03 DIAGNOSIS — K625 Hemorrhage of anus and rectum: Secondary | ICD-10-CM | POA: Diagnosis not present

## 2021-03-03 DIAGNOSIS — K6289 Other specified diseases of anus and rectum: Secondary | ICD-10-CM

## 2021-03-03 DIAGNOSIS — Z7984 Long term (current) use of oral hypoglycemic drugs: Secondary | ICD-10-CM | POA: Diagnosis not present

## 2021-03-03 DIAGNOSIS — Z79899 Other long term (current) drug therapy: Secondary | ICD-10-CM | POA: Diagnosis not present

## 2021-03-03 DIAGNOSIS — Z86010 Personal history of colonic polyps: Secondary | ICD-10-CM | POA: Insufficient documentation

## 2021-03-03 DIAGNOSIS — K641 Second degree hemorrhoids: Secondary | ICD-10-CM | POA: Diagnosis not present

## 2021-03-03 DIAGNOSIS — E119 Type 2 diabetes mellitus without complications: Secondary | ICD-10-CM | POA: Diagnosis not present

## 2021-03-03 DIAGNOSIS — K642 Third degree hemorrhoids: Secondary | ICD-10-CM | POA: Insufficient documentation

## 2021-03-03 DIAGNOSIS — E785 Hyperlipidemia, unspecified: Secondary | ICD-10-CM | POA: Diagnosis not present

## 2021-03-03 HISTORY — PX: EVALUATION UNDER ANESTHESIA WITH HEMORRHOIDECTOMY: SHX5624

## 2021-03-03 HISTORY — PX: MASS EXCISION: SHX2000

## 2021-03-03 LAB — POCT I-STAT, CHEM 8
BUN: 15 mg/dL (ref 8–23)
Calcium, Ion: 1.24 mmol/L (ref 1.15–1.40)
Chloride: 107 mmol/L (ref 98–111)
Creatinine, Ser: 0.9 mg/dL (ref 0.61–1.24)
Glucose, Bld: 116 mg/dL — ABNORMAL HIGH (ref 70–99)
HCT: 42 % (ref 39.0–52.0)
Hemoglobin: 14.3 g/dL (ref 13.0–17.0)
Potassium: 4 mmol/L (ref 3.5–5.1)
Sodium: 148 mmol/L — ABNORMAL HIGH (ref 135–145)
TCO2: 27 mmol/L (ref 22–32)

## 2021-03-03 SURGERY — EXAM UNDER ANESTHESIA WITH HEMORRHOIDECTOMY
Anesthesia: General | Site: Anus

## 2021-03-03 MED ORDER — METRONIDAZOLE 500 MG/100ML IV SOLN
INTRAVENOUS | Status: AC
  Filled 2021-03-03: qty 100

## 2021-03-03 MED ORDER — BUPIVACAINE-EPINEPHRINE 0.25% -1:200000 IJ SOLN
INTRAMUSCULAR | Status: DC | PRN
  Administered 2021-03-03: 20 mL

## 2021-03-03 MED ORDER — FENTANYL CITRATE (PF) 100 MCG/2ML IJ SOLN
INTRAMUSCULAR | Status: DC | PRN
  Administered 2021-03-03 (×2): 50 ug via INTRAVENOUS

## 2021-03-03 MED ORDER — SUGAMMADEX SODIUM 500 MG/5ML IV SOLN
INTRAVENOUS | Status: AC
  Filled 2021-03-03: qty 5

## 2021-03-03 MED ORDER — ROCURONIUM BROMIDE 10 MG/ML (PF) SYRINGE
PREFILLED_SYRINGE | INTRAVENOUS | Status: AC
  Filled 2021-03-03: qty 30

## 2021-03-03 MED ORDER — DIAZEPAM 5 MG PO TABS: 5.0000 mg | ORAL_TABLET | Freq: Three times a day (TID) | ORAL | 1 refills | Status: DC | PRN

## 2021-03-03 MED ORDER — FENTANYL CITRATE (PF) 100 MCG/2ML IJ SOLN
INTRAMUSCULAR | Status: AC
  Filled 2021-03-03: qty 2

## 2021-03-03 MED ORDER — SUGAMMADEX SODIUM 200 MG/2ML IV SOLN
INTRAVENOUS | Status: DC | PRN
  Administered 2021-03-03: 200 mg via INTRAVENOUS

## 2021-03-03 MED ORDER — SUGAMMADEX SODIUM 200 MG/2ML IV SOLN: INTRAVENOUS | Status: DC | PRN

## 2021-03-03 MED ORDER — CHLORHEXIDINE GLUCONATE CLOTH 2 % EX PADS: 6.0000 | MEDICATED_PAD | Freq: Once | CUTANEOUS | Status: DC

## 2021-03-03 MED ORDER — DEXAMETHASONE SODIUM PHOSPHATE 10 MG/ML IJ SOLN
INTRAMUSCULAR | Status: AC
  Filled 2021-03-03: qty 1

## 2021-03-03 MED ORDER — DIBUCAINE (PERIANAL) 1 % EX OINT
TOPICAL_OINTMENT | CUTANEOUS | Status: DC | PRN
  Administered 2021-03-03: 1 via RECTAL

## 2021-03-03 MED ORDER — ACETAMINOPHEN 325 MG PO TABS
325.0000 mg | ORAL_TABLET | ORAL | Status: DC | PRN
Start: 2021-03-03 — End: 2021-03-03

## 2021-03-03 MED ORDER — BUPIVACAINE LIPOSOME 1.3 % IJ SUSP
INTRAMUSCULAR | Status: DC | PRN
  Administered 2021-03-03: 20 mL

## 2021-03-03 MED ORDER — BUPIVACAINE-EPINEPHRINE 0.25% -1:200000 IJ SOLN
INTRAMUSCULAR | Status: AC
  Filled 2021-03-03: qty 1

## 2021-03-03 MED ORDER — 0.9 % SODIUM CHLORIDE (POUR BTL) OPTIME
TOPICAL | Status: DC | PRN
  Administered 2021-03-03: 1000 mL

## 2021-03-03 MED ORDER — ONDANSETRON HCL 4 MG/2ML IJ SOLN
INTRAMUSCULAR | Status: AC
  Filled 2021-03-03: qty 2

## 2021-03-03 MED ORDER — METRONIDAZOLE 500 MG/100ML IV SOLN
500.0000 mg | INTRAVENOUS | Status: AC
  Administered 2021-03-03: 500 mg via INTRAVENOUS

## 2021-03-03 MED ORDER — SODIUM CHLORIDE 0.9 % IV SOLN
INTRAVENOUS | Status: AC
  Filled 2021-03-03: qty 100

## 2021-03-03 MED ORDER — GABAPENTIN 300 MG PO CAPS
ORAL_CAPSULE | ORAL | Status: AC
  Filled 2021-03-03: qty 1

## 2021-03-03 MED ORDER — ACETAMINOPHEN 500 MG PO TABS
ORAL_TABLET | ORAL | Status: AC
  Filled 2021-03-03: qty 2

## 2021-03-03 MED ORDER — ONDANSETRON HCL 4 MG/2ML IJ SOLN: 4.0000 mg | Freq: Once | INTRAMUSCULAR | Status: DC | PRN

## 2021-03-03 MED ORDER — AMLODIPINE BESYLATE 10 MG PO TABS
10.0000 mg | ORAL_TABLET | Freq: Every day | ORAL | Status: AC
  Administered 2021-03-03: 10 mg via ORAL
  Filled 2021-03-03: qty 1

## 2021-03-03 MED ORDER — ACETAMINOPHEN 500 MG PO TABS
1000.0000 mg | ORAL_TABLET | ORAL | Status: AC
  Administered 2021-03-03: 1000 mg via ORAL

## 2021-03-03 MED ORDER — PHENYLEPHRINE HCL (PRESSORS) 10 MG/ML IV SOLN
INTRAVENOUS | Status: DC | PRN
  Administered 2021-03-03 (×3): 80 ug via INTRAVENOUS

## 2021-03-03 MED ORDER — CEFTRIAXONE SODIUM 2 G IJ SOLR
INTRAMUSCULAR | Status: AC
  Filled 2021-03-03: qty 20

## 2021-03-03 MED ORDER — OXYCODONE HCL 5 MG PO TABS: 5.0000 mg | ORAL_TABLET | Freq: Four times a day (QID) | ORAL | 0 refills | Status: DC | PRN

## 2021-03-03 MED ORDER — BUPIVACAINE LIPOSOME 1.3 % IJ SUSP: 20.0000 mL | Freq: Once | INTRAMUSCULAR | Status: DC

## 2021-03-03 MED ORDER — PROPOFOL 10 MG/ML IV BOLUS
INTRAVENOUS | Status: AC
  Filled 2021-03-03: qty 40

## 2021-03-03 MED ORDER — DEXAMETHASONE SODIUM PHOSPHATE 4 MG/ML IJ SOLN
INTRAMUSCULAR | Status: DC | PRN
  Administered 2021-03-03: 4 mg via INTRAVENOUS

## 2021-03-03 MED ORDER — FENTANYL CITRATE (PF) 100 MCG/2ML IJ SOLN
25.0000 ug | INTRAMUSCULAR | Status: DC | PRN
  Administered 2021-03-03: 25 ug via INTRAVENOUS
  Administered 2021-03-03: 50 ug via INTRAVENOUS
  Administered 2021-03-03: 25 ug via INTRAVENOUS

## 2021-03-03 MED ORDER — ACETAMINOPHEN 160 MG/5ML PO SOLN: 325.0000 mg | ORAL | Status: DC | PRN

## 2021-03-03 MED ORDER — LIDOCAINE HCL (CARDIAC) PF 100 MG/5ML IV SOSY
PREFILLED_SYRINGE | INTRAVENOUS | Status: DC | PRN
  Administered 2021-03-03: 80 mg via INTRAVENOUS

## 2021-03-03 MED ORDER — KETOROLAC TROMETHAMINE 30 MG/ML IJ SOLN: 30.0000 mg | Freq: Once | INTRAMUSCULAR | Status: DC | PRN

## 2021-03-03 MED ORDER — MIDAZOLAM HCL 2 MG/2ML IJ SOLN
INTRAMUSCULAR | Status: DC | PRN
  Administered 2021-03-03: 1 mg via INTRAVENOUS

## 2021-03-03 MED ORDER — KETOROLAC TROMETHAMINE 30 MG/ML IJ SOLN
INTRAMUSCULAR | Status: DC | PRN
  Administered 2021-03-03: 15 mg via INTRAVENOUS

## 2021-03-03 MED ORDER — MEPERIDINE HCL 25 MG/ML IJ SOLN: 6.2500 mg | INTRAMUSCULAR | Status: DC | PRN

## 2021-03-03 MED ORDER — OXYCODONE HCL 5 MG/5ML PO SOLN: 5.0000 mg | Freq: Once | ORAL | Status: DC | PRN

## 2021-03-03 MED ORDER — OXYCODONE HCL 5 MG PO TABS: 5.0000 mg | ORAL_TABLET | Freq: Once | ORAL | Status: DC | PRN

## 2021-03-03 MED ORDER — MIDAZOLAM HCL 2 MG/2ML IJ SOLN
INTRAMUSCULAR | Status: AC
  Filled 2021-03-03: qty 2

## 2021-03-03 MED ORDER — DIBUCAINE (PERIANAL) 1 % EX OINT
TOPICAL_OINTMENT | CUTANEOUS | Status: AC
  Filled 2021-03-03: qty 28

## 2021-03-03 MED ORDER — PROPOFOL 10 MG/ML IV BOLUS
INTRAVENOUS | Status: DC | PRN
  Administered 2021-03-03: 50 mg via INTRAVENOUS
  Administered 2021-03-03: 150 mg via INTRAVENOUS
  Administered 2021-03-03: 50 mg via INTRAVENOUS

## 2021-03-03 MED ORDER — ENSURE PRE-SURGERY PO LIQD: 296.0000 mL | Freq: Once | ORAL | Status: DC

## 2021-03-03 MED ORDER — SODIUM CHLORIDE 0.9 % IV SOLN: INTRAVENOUS | Status: DC

## 2021-03-03 MED ORDER — ROCURONIUM BROMIDE 100 MG/10ML IV SOLN
INTRAVENOUS | Status: DC | PRN
  Administered 2021-03-03: 50 mg via INTRAVENOUS

## 2021-03-03 MED ORDER — CARVEDILOL 3.125 MG PO TABS
3.1250 mg | ORAL_TABLET | Freq: Once | ORAL | Status: AC
  Administered 2021-03-03: 3.125 mg via ORAL
  Filled 2021-03-03: qty 1

## 2021-03-03 MED ORDER — BUPIVACAINE LIPOSOME 1.3 % IJ SUSP
INTRAMUSCULAR | Status: AC
  Filled 2021-03-03: qty 20

## 2021-03-03 MED ORDER — LIDOCAINE HCL (PF) 2 % IJ SOLN
INTRAMUSCULAR | Status: AC
  Filled 2021-03-03: qty 5

## 2021-03-03 MED ORDER — GABAPENTIN 300 MG PO CAPS
300.0000 mg | ORAL_CAPSULE | ORAL | Status: AC
  Administered 2021-03-03: 300 mg via ORAL

## 2021-03-03 MED ORDER — SODIUM CHLORIDE 0.9 % IV SOLN
2.0000 g | INTRAVENOUS | Status: AC
  Administered 2021-03-03: 2 g via INTRAVENOUS

## 2021-03-03 SURGICAL SUPPLY — 53 items
APL SKNCLS STERI-STRIP NONHPOA (GAUZE/BANDAGES/DRESSINGS) ×1
BENZOIN TINCTURE PRP APPL 2/3 (GAUZE/BANDAGES/DRESSINGS) ×2 IMPLANT
BLADE HEX COATED 2.75 (ELECTRODE) ×2 IMPLANT
BLADE SURG 10 STRL SS (BLADE) IMPLANT
BLADE SURG 15 STRL LF DISP TIS (BLADE) ×1 IMPLANT
BLADE SURG 15 STRL SS (BLADE) ×2
BRIEF STRETCH FOR OB PAD LRG (UNDERPADS AND DIAPERS) ×2 IMPLANT
CANISTER SUCT 1200ML W/VALVE (MISCELLANEOUS) ×2 IMPLANT
COVER BACK TABLE 60X90IN (DRAPES) ×2 IMPLANT
COVER MAYO STAND STRL (DRAPES) ×2 IMPLANT
DECANTER SPIKE VIAL GLASS SM (MISCELLANEOUS) ×2 IMPLANT
DRAPE HYSTEROSCOPY (MISCELLANEOUS) IMPLANT
DRAPE LAPAROTOMY 100X72 PEDS (DRAPES) ×2 IMPLANT
DRAPE SHEET LG 3/4 BI-LAMINATE (DRAPES) IMPLANT
DRSG PAD ABDOMINAL 8X10 ST (GAUZE/BANDAGES/DRESSINGS) ×2 IMPLANT
ELECT NEEDLE TIP 2.8 STRL (NEEDLE) IMPLANT
ELECT REM PT RETURN 9FT ADLT (ELECTROSURGICAL) ×2
ELECTRODE REM PT RTRN 9FT ADLT (ELECTROSURGICAL) ×1 IMPLANT
FILTER STRAW (MISCELLANEOUS) ×2 IMPLANT
GLOVE SRG 8 PF TXTR STRL LF DI (GLOVE) ×1 IMPLANT
GLOVE SURG LTX SZ8 (GLOVE) ×2 IMPLANT
GLOVE SURG UNDER POLY LF SZ8 (GLOVE) ×2
GOWN STRL REUS W/TWL XL LVL3 (GOWN DISPOSABLE) ×2 IMPLANT
IV CATH PLACEMENT 20 GA (IV SOLUTION) ×2 IMPLANT
KIT SIGMOIDOSCOPE (SET/KITS/TRAYS/PACK) IMPLANT
KIT TURNOVER CYSTO (KITS) ×2 IMPLANT
LEGGING LITHOTOMY PAIR STRL (DRAPES) IMPLANT
NEEDLE HYPO 22GX1.5 SAFETY (NEEDLE) ×2 IMPLANT
NS IRRIG 500ML POUR BTL (IV SOLUTION) ×2 IMPLANT
PACK BASIN DAY SURGERY FS (CUSTOM PROCEDURE TRAY) ×2 IMPLANT
PAD PREP 24X48 CUFFED NSTRL (MISCELLANEOUS) IMPLANT
PENCIL SMOKE EVACUATOR (MISCELLANEOUS) ×2 IMPLANT
SCRUB TECHNI CARE 4 OZ NO DYE (MISCELLANEOUS) ×2 IMPLANT
SHEARS HARMONIC 9CM CVD (BLADE) IMPLANT
SURGILUBE 2OZ TUBE FLIPTOP (MISCELLANEOUS) ×2 IMPLANT
SUT CHROMIC 2 0 SH (SUTURE) ×4 IMPLANT
SUT CHROMIC 3 0 SH 27 (SUTURE) IMPLANT
SUT VIC AB 2-0 SH 27 (SUTURE)
SUT VIC AB 2-0 SH 27XBRD (SUTURE) IMPLANT
SUT VIC AB 2-0 UR6 27 (SUTURE) ×16 IMPLANT
SUT VICRYL 0 UR6 27IN ABS (SUTURE) IMPLANT
SUT VICRYL AB 2 0 TIE (SUTURE) IMPLANT
SUT VICRYL AB 2 0 TIES (SUTURE)
SYR 20ML LL LF (SYRINGE) ×2 IMPLANT
SYR 27GX1/2 1ML LL SAFETY (SYRINGE) ×2 IMPLANT
SYR BULB IRRIG 60ML STRL (SYRINGE) ×2 IMPLANT
SYR CONTROL 10ML LL (SYRINGE) IMPLANT
TAPE CLOTH 3X10 TAN LF (GAUZE/BANDAGES/DRESSINGS) ×2 IMPLANT
TOWEL OR 17X26 10 PK STRL BLUE (TOWEL DISPOSABLE) ×2 IMPLANT
TRAY DSU PREP LF (CUSTOM PROCEDURE TRAY) ×2 IMPLANT
TUBE CONNECTING 12X1/4 (SUCTIONS) ×2 IMPLANT
UNDERPAD 30X36 HEAVY ABSORB (UNDERPADS AND DIAPERS) ×2 IMPLANT
YANKAUER SUCT BULB TIP NO VENT (SUCTIONS) ×2 IMPLANT
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
## 2021-03-03 NOTE — Op Note (Signed)
03/03/2021  2:52 PM  PATIENT:  Logan Knight  70 y.o. male  Patient Care Team: Ladell Pier, MD as PCP - General (Internal Medicine) Michael Boston, MD as Consulting Physician (General Surgery) Otis Brace, MD as Consulting Physician (Gastroenterology)  PRE-OPERATIVE DIAGNOSIS:   PROLAPSING RECTAL MASS WITH BLEEDING INTERNAL HEMORRHOIDS GRADE 2-3  POST-OPERATIVE DIAGNOSIS:   PROLAPSING RECTAL MASS WITH BLEEDING INTERNAL HEMORRHOIDS GRADE 2-3  PROCEDURE:    PARTIAL PROCTECTOMY EXCISION OF PROLAPSING RECTAL MASS HEMORRHOIDECTOMY X 1 HEMORRHOIDAL LIGATION AND HEMORRHOIDOPEXY ANORECTAL EXAM UNDER ANESTHESIA   SURGEON:  Adin Hector, MD  ASSISTANT:  .Lucas Mallow, MD, PGY 6 [place]   I was personally present during the key and critical portions of this procedure and immediately available throughout the entire procedure, as documented in my operative note.   ANESTHESIA:   General Anorectal & Local field block (0.25% bupivacaine with epinephrine mixed with Liposomal bupivacaine (Experel)   EBL:  Total I/O In: 1000 [I.V.:1000] Out: 30 [Blood:30].  See operative record  Delay start of Pharmacological VTE agent (>24hrs) due to surgical blood loss or risk of bleeding:  NO  DRAINS: NONE  SPECIMEN:   Right posterolateral prolapsing polypoid distal rectal mass Internal & external hemorrhoid x1 - left posterior  DISPOSITION OF SPECIMEN:  PATHOLOGY  COUNTS:  YES  PLAN OF CARE: Discharge home after PACU  PATIENT DISPOSITION:  PACU - hemodynamically stable.  INDICATION: Pleasant patient undergoing colonoscopy.  Adenomatous polyps removed with large prolapsing distal rectal mass noted.  Gastrology recommended surgical consultation from transanal excision.  I recommended  examination under anesthesia and surgical treatment:  The anatomy & physiology of the anorectal region was discussed.  The pathophysiology of polyps, hemorrhoids and differential  diagnosis was discussed.  Natural history risks without surgery was discussed.   I stressed the importance of a bowel regimen to have daily soft bowel movements to minimize progression of disease.  Interventions such as sclerotherapy & banding were discussed.  The patient's symptoms are not adequately controlled by medicines and other non-operative treatments.  I feel the risks & problems of no surgery outweigh the operative risks; therefore, I recommended surgery to treat the hemorrhoids by ligation, pexy, and transanal excision of large prolapsing mass.  Risks such as bleeding, infection, urinary difficulties, injury to other organs, need for repair of tissues / organs, need for further treatment, heart attack, death, and other risks were discussed.   I noted a good likelihood this will help address the problem.  Goals of post-operative recovery were discussed as well.  Possibility that this will not correct all symptoms was explained.  Post-operative pain, bleeding, constipation, and other problems after surgery were discussed.  We will work to minimize complications.   Educational handouts further explaining the pathology, treatment options, and bowel regimen were given as well.  Questions were answered.  The patient expresses understanding & wishes to proceed with surgery.  OR FINDINGS: Patient had very large polypoid friable, soft, chronically prolapsed mass primarily from the right posterior distal rectum involving almost a quarter of the circumference.  Excision, closure, and pexy done.  Left posterior grade 3 hemorrhoid with external component.  Pexy and external/internal hemorrhoidectomy done.  Left anterior grade 2 internal hemorrhoid.  Ligation and pexy done.  DESCRIPTION:   Informed consent was confirmed. Patient underwent general anesthesia without difficulty. Patient was placed into prone positioning.  The perianal region was prepped and draped in sterile fashion. Surgical time-out  confirmed our plan.  I did  digital rectal examination and then transitioned over to anoscopy to get a sense of the anatomy.  Findings noted above.   I focused on resecting the large prolapsing mass.  I was able to grab the external component and placed on axial tension.  It seemed involved the right posterior and posterior lateral aspects.  I proceeded to do rectal ligation and pexy. I used a 2-0 Vicryl suture on a UR-6 needle in a figure-of-eight fashion 6 cm proximal to the anal verge.  I started at the large prolapsing mass.  I ran the stitch down a few centimeters.  I then excised the somewhat sessile but mostly pedunculated rectal polypoid mass longitudinally in a fusiform biconcave fashion, sparing the anal canal to avoid narrowing.  I then ran that stitch longitudinally more distally to close the rectal and anal canal wound to the anal verge over a large Parks self-retaining anal retractor to avoid narrowing of the anal canal.  I then tied that stitch down to cause a hemorrhoidopexy and provide good hemostasis.     Then focused on the rest of the hemorrhoid piles.  I then did hemorrhoidal ligation and pexy at the other 5 hemorrhoidal columns.  Patient had significant left posterior internal and especially external hemorrhoidal prolapsing pile that I excised longitudinally as well.  At the completion of this, all 6 anorectal columns were ligated and pexied in the classic hexagonal fashion (right anterior/lateral/posterior, left anterior/lateral/posterior).  I trimmed & closed the external part of the right posterior proctectomy and left posterior hemorrhoidectomy wounds with radial interrupted horizontal mattress 2-0 chromic suture over a large Sawyer anal retractor, leaving the last 5 mm open to allow natural drainage.    I redid anoscopy & examination.  At completion of this, all hemorrhoids had been removed or reduced into the rectum.  There is no more prolapse.  Internal & external anatomy was  more more normal.  Hemostasis was good.  Fluffed gauze was on-laid over the perianal region.  No packing done.  Patient is being extubated go to go to the recovery room.  I had discussed postop care in detail with the patient in the preop holding area.  Instructions for post-operative recovery and prescriptions are written. I made an attempt to locate family to discuss patient's status and recommendations.  No one is available at this time.     Adin Hector, M.D., F.A.C.S. Gastrointestinal and Minimally Invasive Surgery [facility] [address] [place], [place] [address] [phone] Main / Paging

## 2021-03-03 NOTE — Discharge Instructions (Addendum)
ANORECTAL SURGERY:  POST OPERATIVE INSTRUCTIONS  ######################################################################  EAT Start with a pureed / full liquid diet After 24 hours, gradually transition to a high fiber diet.    CONTROL PAIN Control pain so you can tolerate bowel movements,  walk, sleep, tolerate sneezing/coughing, and go up/down stairs.   HAVE A BOWEL MOVEMENT DAILY Keep your bowels regular to avoid problems.   Taking a fiber supplement every day to keep bowels soft.   Try a laxative to override constipation. Use an antidairrheal to slow down diarrhea.   Call if not better after 2 tries  WALK Walk an hour a day.  Control your pain to do that.   CALL IF YOU HAVE PROBLEMS/CONCERNS Call if you are still struggling despite following these instructions. Call if you have concerns not answered by these instructions  ######################################################################    Take your usually prescribed home medications unless otherwise directed.  DIET: Follow a light bland diet & liquids the first 24 hours after arrival home, such as soup, liquids, starches, etc.  Be sure to drink plenty of fluids.  Quickly advance to a usual solid diet within a few days.  Avoid fast food or heavy meals as your are more likely to get nauseated or have irregular bowels.  A low-fat, high-fiber diet for the rest of your life is ideal.  PAIN CONTROL: Pain is best controlled by a usual combination of three different methods TOGETHER: Ice/Heat Over the counter pain medication Prescription pain medication Expect swelling and discomfort in the anus/rectal area.  Warm water baths (30-60 minutes up to 6 times a day, especially after bowel meovements) will help. Use ice for the first few days to help decrease swelling and bruising, then switch to heat such as warm towels, sitz baths, warm baths, etc to help relax tight/sore spots and speed recovery.  Some people prefer to use ice  alone, heat alone, alternating between ice & heat.  Experiment to what works for you.   It is helpful to take an over-the-counter pain medication continuously for the first few weeks.  Choose one of the following that works best for you: Naproxen (Aleve, etc)  Two 220mg  tabs twice a day Ibuprofen (Advil, etc) Three 200mg  tabs four times a day (every meal & bedtime) Acetaminophen (Tylenol, etc) 500-650mg  four times a day (every meal & bedtime) A  prescription for pain medication (such as oxycodone, hydrocodone, etc) should be given to you upon discharge.  Take your pain medication as prescribed.  If you are having problems/concerns with the prescription medicine (does not control pain, nausea, vomiting, rash, itching, etc), please call us [phone] to see if we need to switch you to a different pain medicine that will work better for you and/or control your side effect better. If you need a refill on your pain medication, please contact your pharmacy.  They will contact our office to request authorization. Prescriptions will not be filled after 5 pm or on week-ends.  If can take up to 48 hours for it to be filled & ready so avoid waiting until you are down to thel ast pill. A topical cream (Dibucaine) or a prescription for a cream (such as diltiazem 2% gel) may be given to you.  Many people find relief with topical creams.  Some people find it burns too much.  Experiment.  If it helps, use it.  If it burns, don't using it.  Use a Sitz Bath 4-8 times a day for relief   [employer]  A sitz bath is a warm water bath taken in the sitting position that covers only the hips and buttocks. It may be used for either healing or hygiene purposes. Sitz baths are also used to relieve pain, itching, or muscle spasms. The water may contain medicine. Moist heat will help you heal and relax.  HOME CARE INSTRUCTIONS  Take 3 to 4 sitz baths a day. Fill the bathtub half full with warm water. Sit in the water and open  the drain a little. Turn on the warm water to keep the tub half full. Keep the water running constantly. Soak in the water for 15 to 20 minutes. After the sitz bath, pat the affected area dry first.   KEEP YOUR BOWELS REGULAR The goal is one soft bowel movement a day Avoid getting constipated.  Between the surgery and the pain medications, it is common to experience some constipation.  Increasing fluid intake and taking a fiber supplement (such as Metamucil, Citrucel, FiberCon, MiraLax, etc) 2-3 times a day regularly will usually help prevent this problem from occurring.  A mild laxative (prune juice, Milk of Magnesia, MiraLax, etc) should be taken according to package directions if there are no bowel movements after 48 hours. Watch out for diarrhea.  If you have many loose bowel movements, simplify your diet to bland foods & liquids for a few days.  Stop any stool softeners and decrease your fiber supplement.  Switching to mild anti-diarrheal medications (Kayopectate, Pepto Bismol) can help.  Can try an imodium/loperamide dose.  If this worsens or does not improve, please call us.  Wound Care  Remove your bandages with your first bowel movement, usually the day after surgery.  Let the gauze fall off with the first bowel movement or shower.   Wear an absorbent pad or soft cotton balls in your underwear as needed to catch any drainage and help keep the area  Keep the area clean and dry.  Bathe / shower every day.  Keep the area clean by showering / bathing over the incision / wound.   It is okay to soak an open wound to help wash it.  Consider using a squeeze bottle filled with warm water to gently wash the anal area.  Wet wipes or showers / gentle washing after bowel movements is often less traumatic than regular toilet paper. You will often notice bleeding with bowel movements.  This should slow down by the end of the first week of surgery.  Sitting on an ice pack can help. Expect some drainage.   This should slow down by the end of the first week of surgery, but you will have occasional bleeding or drainage up to a few months after surgery.  Wear an absorbent pad or soft cotton gauze in your underwear until the drainage stops.  ACTIVITIES as tolerated:   You may resume regular (light) daily activities beginning the next day--such as daily self-care, walking, climbing stairs--gradually increasing activities as tolerated.  If you can walk 30 minutes without difficulty, it is safe to try more intense activity such as jogging, treadmill, bicycling, low-impact aerobics, swimming, etc. Save the most intensive and strenuous activity for last such as sit-ups, heavy lifting, contact sports, etc  Refrain from any heavy lifting or straining until you are off narcotics for pain control.   DO NOT PUSH THROUGH PAIN.  Let pain be your guide: If it hurts to do something, don't do it.  Pain is your body warning you to avoid that  activity for another week until the pain goes down. You may drive when you are no longer taking prescription pain medication, you can comfortably sit for long periods of time, and you can safely maneuver your car and apply brakes. You may have sexual intercourse when it is comfortable.  FOLLOW UP in our office Please call [facility] at [phone] to set up an appointment to see your surgeon in the office for a follow-up appointment approximately 2-3 weeks after your surgery. Make sure that you call for this appointment the day you arrive home to ensure a convenient appointment time.  8. IF YOU HAVE DISABILITY OR FAMILY LEAVE FORMS, BRING THEM TO THE OFFICE FOR PROCESSING.  DO NOT GIVE THEM TO YOUR DOCTOR.        WHEN TO CALL us [phone]: Poor pain control Reactions / problems with new medications (rash/itching, nausea, etc)  Fever over 101.5 F (38.5 C) Inability to urinate Nausea and/or vomiting Worsening swelling or bruising Continued bleeding from incision. Increased  pain, redness, or drainage from the incision  The clinic staff is available to answer your questions during regular business hours (8:30am-5pm).  Please don't hesitate to call and ask to speak to one of our nurses for clinical concerns.   A surgeon from [facility] is always on call at the hospitals   If you have a medical emergency, go to the nearest emergency room or call 911.    [facility], [place], [place], [place], [place]  [address] ? MAIN: [phone] ? TOLL FREE: [phone] ? FAX [phone] [email or web address]      Post Anesthesia Home Care Instructions  Activity: Get plenty of rest for the remainder of the day. A responsible individual must stay with you for 24 hours following the procedure.  For the next 24 hours, DO NOT: -Drive a car -Paediatric nurse -Drink alcoholic beverages -Take any medication unless instructed by your physician -Make any legal decisions or sign important papers.  Meals: Start with liquid foods such as gelatin or soup. Progress to regular foods as tolerated. Avoid greasy, spicy, heavy foods. If nausea and/or vomiting occur, drink only clear liquids until the nausea and/or vomiting subsides. Call your physician if vomiting continues.  Special Instructions/Symptoms: Your throat may feel dry or sore from the anesthesia or the breathing tube placed in your throat during surgery. If this causes discomfort, gargle with warm salt water. The discomfort should disappear within 24 hours.  Follow all other discharge instructions given to you by your surgeon or nurse. Eat a healthy diet and drink plenty of water or other fluids.  Do not take any tylenol until after 7:00 pm today.  Do not take any nonsteroidal anti inflammatories until after 8:30 pm today.        Information for Discharge Teaching: EXPAREL (bupivacaine liposome injectable suspension)   Your surgeon or anesthesiologist  gave you EXPAREL(bupivacaine) to help control your pain after surgery.  EXPAREL is a local anesthetic that provides pain relief by numbing the tissue around the surgical site. EXPAREL is designed to release pain medication over time and can control pain for up to 72 hours. Depending on how you respond to EXPAREL, you may require less pain medication during your recovery.  Possible side effects: Temporary loss of sensation or ability to move in the area where bupivacaine was injected. Nausea, vomiting, constipation Rarely, numbness and tingling in your mouth or lips, lightheadedness, or anxiety may occur. Call your doctor  right away if you think you may be experiencing any of these sensations, or if you have other questions regarding possible side effects.    If you return to the hospital for any reason within 96 hours following the administration of EXPAREL, it is important for health care providers to know that you have received this anesthetic. A teal colored band has been placed on your arm with the date, time and amount of EXPAREL you have received in order to alert and inform your health care providers. Please leave this armband in place for the full 96 hours (Sunday 03/07/21) following administration, and then you may remove the band.

## 2021-03-03 NOTE — Anesthesia Preprocedure Evaluation (Addendum)
Anesthesia Evaluation  Patient identified by MRN, date of birth, ID band Patient awake    Reviewed: Allergy & Precautions, NPO status , Patient's Chart, lab work & pertinent test results  Airway Mallampati: II  TM Distance: >3 FB     Dental   Pulmonary neg pulmonary ROS,    breath sounds clear to auscultation       Cardiovascular hypertension,  Rhythm:Regular Rate:Normal     Neuro/Psych negative neurological ROS     GI/Hepatic negative GI ROS, Neg liver ROS,   Endo/Other  diabetes  Renal/GU negative Renal ROS     Musculoskeletal   Abdominal   Peds  Hematology   Anesthesia Other Findings   Reproductive/Obstetrics                             Anesthesia Physical Anesthesia Plan  ASA: 3  Anesthesia Plan: General   Post-op Pain Management:    Induction: Intravenous  PONV Risk Score and Plan: 2 and Ondansetron, Dexamethasone and Midazolam  Airway Management Planned: Oral ETT  Additional Equipment:   Intra-op Plan:   Post-operative Plan: Extubation in OR  Informed Consent: I have reviewed the patients History and Physical, chart, labs and discussed the procedure including the risks, benefits and alternatives for the proposed anesthesia with the patient or authorized representative who has indicated his/her understanding and acceptance.     Dental advisory given  Plan Discussed with: CRNA and Anesthesiologist  Anesthesia Plan Comments:        Anesthesia Quick Evaluation

## 2021-03-03 NOTE — Transfer of Care (Signed)
Immediate Anesthesia Transfer of Care Note  Patient: Rutherford Nail  Procedure(s) Performed: EXAM UNDER ANESTHESIA WITH HEMORRHOIDECTOMY WITH LIGATION AND HEMORRHOIDOPEXY (Anus) EXCISION OF ANAL MASSES (Anus)  Patient Location: PACU  Anesthesia Type:General  Level of Consciousness: awake, alert  and oriented  Airway & Oxygen Therapy: Patient Spontanous Breathing and Patient connected to nasal cannula oxygen  Post-op Assessment: Report given to RN and Post -op Vital signs reviewed and stable  Post vital signs: Reviewed and stable  Last Vitals:  Vitals Value Taken Time  BP 164/104 03/03/21 1455  Temp 36.6 C 03/03/21 1454  Pulse 89 03/03/21 1459  Resp 12 03/03/21 1459  SpO2 100 % 03/03/21 1459  Vitals shown include unvalidated device data.  Last Pain:  Vitals:   03/03/21 1106  TempSrc: Oral  PainSc: 0-No pain      Patients Stated Pain Goal: 1 (34/35/68 6168)  Complications: No notable events documented.

## 2021-03-03 NOTE — Progress Notes (Signed)
I discussed operative findings, updated the patient's status, discussed probable steps to recovery, and gave postoperative recommendations to the patient's spouse, Shanti Eichel.  Recommendations were made.  Questions were answered.  She expressed understanding & appreciation.  Adin Hector, MD, FACS, MASCRS Esophageal, Gastrointestinal & Colorectal Surgery Robotic and Minimally Invasive Surgery  [facility] [address], [place], [place] [address] [phone] Fax [phone] Main/Paging  CONTACT INFORMATION: Weekday (9AM-5PM) concerns: Call [facility] main office at [phone] Weeknight (5PM-9AM) or Weekend/Holiday concerns: Check [email or web address] for General Surgery [facility] coverage (Please, do not use SecureChat as it is not reliable communication to operating surgeons for immediate patient care)
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
## 2021-03-03 NOTE — Anesthesia Postprocedure Evaluation (Signed)
Anesthesia Post Note  Patient: Logan Knight  Procedure(s) Performed: EXAM UNDER ANESTHESIA WITH HEMORRHOIDECTOMY WITH LIGATION AND HEMORRHOIDOPEXY (Anus) EXCISION OF ANAL MASSES (Anus)     Patient location during evaluation: PACU Anesthesia Type: General Level of consciousness: awake Pain management: pain level controlled Vital Signs Assessment: post-procedure vital signs reviewed and stable Respiratory status: spontaneous breathing Cardiovascular status: stable Postop Assessment: no apparent nausea or vomiting Anesthetic complications: no   No notable events documented.  Last Vitals:  Vitals:   03/03/21 1454 03/03/21 1500  BP: (!) 166/106 (!) 159/105  Pulse: 89 89  Resp: 17 12  Temp: 36.6 C   SpO2: 100% 100%    Last Pain:  Vitals:   03/03/21 1454  TempSrc:   PainSc: 0-No pain                 Astin Sayre

## 2021-03-03 NOTE — Anesthesia Procedure Notes (Addendum)
Procedure Name: Intubation Date/Time: 03/03/2021 1:05 PM Performed by: Georgeanne Nim, CRNA Pre-anesthesia Checklist: Patient identified, Emergency Drugs available, Suction available, Patient being monitored and Timeout performed Patient Re-evaluated:Patient Re-evaluated prior to induction Oxygen Delivery Method: Circle system utilized Preoxygenation: Pre-oxygenation with 100% oxygen Induction Type: IV induction Ventilation: Mask ventilation without difficulty Laryngoscope Size: Mac, 4, Miller and 2 Grade View: Grade III Tube type: Oral (grade 4 with MAC;grade 3 with miller) Tube size: 7.5 mm Number of attempts: 2 Airway Equipment and Method: Stylet Placement Confirmation: ETT inserted through vocal cords under direct vision, positive ETCO2, CO2 detector and breath sounds checked- equal and bilateral Secured at: 23 cm Tube secured with: Tape Dental Injury: Teeth and Oropharynx as per pre-operative assessment  Difficulty Due To: Difficulty was unanticipated Comments: Intubated by Dr. Nyoka Cowden
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
## 2021-03-04 ENCOUNTER — Encounter (HOSPITAL_BASED_OUTPATIENT_CLINIC_OR_DEPARTMENT_OTHER): Payer: Self-pay | Admitting: Surgery

## 2021-03-05 LAB — SURGICAL PATHOLOGY

## 2021-03-23 ENCOUNTER — Encounter: Payer: Medicare HMO | Admitting: Internal Medicine

## 2021-03-23 ENCOUNTER — Encounter: Payer: Medicare HMO | Admitting: Pharmacist

## 2021-04-23 ENCOUNTER — Other Ambulatory Visit: Payer: Self-pay | Admitting: Internal Medicine

## 2021-04-23 DIAGNOSIS — N529 Male erectile dysfunction, unspecified: Secondary | ICD-10-CM

## 2021-05-11 ENCOUNTER — Encounter: Payer: Medicare HMO | Admitting: Pharmacist

## 2021-06-18 ENCOUNTER — Ambulatory Visit: Payer: Medicare HMO | Attending: Internal Medicine | Admitting: Internal Medicine

## 2021-06-18 ENCOUNTER — Other Ambulatory Visit: Payer: Self-pay

## 2021-06-18 VITALS — BP 148/89 | HR 89 | Resp 16 | Wt 210.2 lb

## 2021-06-18 DIAGNOSIS — E1129 Type 2 diabetes mellitus with other diabetic kidney complication: Secondary | ICD-10-CM | POA: Diagnosis not present

## 2021-06-18 DIAGNOSIS — E785 Hyperlipidemia, unspecified: Secondary | ICD-10-CM | POA: Diagnosis not present

## 2021-06-18 DIAGNOSIS — I10 Essential (primary) hypertension: Secondary | ICD-10-CM

## 2021-06-18 DIAGNOSIS — Z23 Encounter for immunization: Secondary | ICD-10-CM

## 2021-06-18 DIAGNOSIS — R972 Elevated prostate specific antigen [PSA]: Secondary | ICD-10-CM | POA: Diagnosis not present

## 2021-06-18 DIAGNOSIS — R809 Proteinuria, unspecified: Secondary | ICD-10-CM

## 2021-06-18 DIAGNOSIS — E1169 Type 2 diabetes mellitus with other specified complication: Secondary | ICD-10-CM

## 2021-06-18 LAB — POCT GLYCOSYLATED HEMOGLOBIN (HGB A1C): HbA1c, POC (controlled diabetic range): 6.5 % (ref 0.0–7.0)

## 2021-06-18 LAB — GLUCOSE, POCT (MANUAL RESULT ENTRY): POC Glucose: 126 mg/dl — AB (ref 70–99)

## 2021-06-18 MED ORDER — CARVEDILOL 6.25 MG PO TABS: 6.2500 mg | ORAL_TABLET | Freq: Two times a day (BID) | ORAL | 1 refills | Status: DC

## 2021-06-18 NOTE — Patient Instructions (Addendum)
I have resubmitted the referral for you to see the urologist for the elevated prostate level.  They will call you with this appointment.  I have submitted the referral to Dr. Katy Fitch for your eye exam.  Your blood pressure is uncontrolled.  We have increased the carvedilol to 6.25 mg twice a day.  I have sent an updated prescription to your pharmacy.
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
## 2021-06-19 LAB — COMPREHENSIVE METABOLIC PANEL
ALT: 21 IU/L (ref 0–44)
AST: 30 IU/L (ref 0–40)
Albumin/Globulin Ratio: 1.4 (ref 1.2–2.2)
Albumin: 4.4 g/dL (ref 3.8–4.8)
Alkaline Phosphatase: 65 IU/L (ref 44–121)
BUN/Creatinine Ratio: 12 (ref 10–24)
BUN: 13 mg/dL (ref 8–27)
Bilirubin Total: 0.4 mg/dL (ref 0.0–1.2)
CO2: 19 mmol/L — ABNORMAL LOW (ref 20–29)
Calcium: 9.9 mg/dL (ref 8.6–10.2)
Chloride: 105 mmol/L (ref 96–106)
Creatinine, Ser: 1.13 mg/dL (ref 0.76–1.27)
Globulin, Total: 3.2 g/dL (ref 1.5–4.5)
Glucose: 110 mg/dL — ABNORMAL HIGH (ref 70–99)
Potassium: 4.2 mmol/L (ref 3.5–5.2)
Sodium: 142 mmol/L (ref 134–144)
Total Protein: 7.6 g/dL (ref 6.0–8.5)
eGFR: 70 mL/min/{1.73_m2} (ref >59)

## 2021-06-19 LAB — LIPID PANEL
Chol/HDL Ratio: 3.4 ratio (ref 0.0–5.0)
Cholesterol, Total: 124 mg/dL (ref 100–199)
HDL: 37 mg/dL — ABNORMAL LOW (ref >39)
LDL Chol Calc (NIH): 62 mg/dL (ref 0–99)
Triglycerides: 140 mg/dL (ref 0–149)
VLDL Cholesterol Cal: 25 mg/dL (ref 5–40)

## 2021-06-19 LAB — CBC
Hematocrit: 41.3 % (ref 37.5–51.0)
Hemoglobin: 14.2 g/dL (ref 13.0–17.7)
MCH: 30.2 pg (ref 26.6–33.0)
MCHC: 34.4 g/dL (ref 31.5–35.7)
MCV: 88 fL (ref 79–97)
Platelets: 253 10*3/uL (ref 150–450)
RBC: 4.7 x10E6/uL (ref 4.14–5.80)
RDW: 13.1 % (ref 11.6–15.4)
WBC: 8.5 10*3/uL (ref 3.4–10.8)

## 2021-06-24 ENCOUNTER — Telehealth: Payer: Self-pay

## 2021-06-24 ENCOUNTER — Ambulatory Visit: Payer: Self-pay | Admitting: *Deleted

## 2021-06-24 NOTE — Telephone Encounter (Signed)
2nd attempt  to contact patient to review lab results . No answer . Left message to call clinic back [phone].

## 2021-06-24 NOTE — Telephone Encounter (Signed)
Calling requesting lab work.   Called patient to review lab work , no answer, left message on voicemail to call clinic back at [phone].

## 2021-06-24 NOTE — Telephone Encounter (Signed)
Contacted pt to go over lab results pt didn't answer lvm   Sent a CRM and forward labs to NT to give pt labs when they call back   

## 2021-06-25 DIAGNOSIS — N400 Enlarged prostate without lower urinary tract symptoms: Secondary | ICD-10-CM | POA: Diagnosis not present

## 2021-06-25 DIAGNOSIS — N5201 Erectile dysfunction due to arterial insufficiency: Secondary | ICD-10-CM | POA: Diagnosis not present

## 2021-06-25 DIAGNOSIS — R972 Elevated prostate specific antigen [PSA]: Secondary | ICD-10-CM | POA: Diagnosis not present

## 2021-06-28 ENCOUNTER — Other Ambulatory Visit: Payer: Self-pay | Admitting: Internal Medicine

## 2021-06-28 DIAGNOSIS — E1169 Type 2 diabetes mellitus with other specified complication: Secondary | ICD-10-CM

## 2021-06-28 DIAGNOSIS — E785 Hyperlipidemia, unspecified: Secondary | ICD-10-CM

## 2021-06-28 NOTE — Telephone Encounter (Signed)
Requested Prescriptions  Pending Prescriptions Disp Refills  . atorvastatin (LIPITOR) 40 MG tablet [Pharmacy Med Name: ATORVASTATIN CALCIUM 40 MG Tablet] 90 tablet 3    Sig: TAKE 1 TABLET EVERY DAY     Cardiovascular:  Antilipid - Statins Failed - 06/28/2021  3:20 AM      Failed - HDL in normal range and within 360 days    HDL  Date Value Ref Range Status  06/18/2021 37 (L) >39 mg/dL Final         Passed - Total Cholesterol in normal range and within 360 days    Cholesterol, Total  Date Value Ref Range Status  06/18/2021 124 100 - 199 mg/dL Final         Passed - LDL in normal range and within 360 days    LDL Chol Calc (NIH)  Date Value Ref Range Status  06/18/2021 62 0 - 99 mg/dL Final         Passed - Triglycerides in normal range and within 360 days    Triglycerides  Date Value Ref Range Status  06/18/2021 140 0 - 149 mg/dL Final         Passed - Patient is not pregnant      Passed - Valid encounter within last 12 months    Recent Outpatient Visits          1 week ago Type 2 diabetes mellitus with microalbuminuria, without long-term current use of insulin ([place])   [place], Neoma Laming B, MD   5 months ago Type 2 diabetes mellitus with microalbuminuria, without long-term current use of insulin [facility])   [place], Annie Main L, RPH-CPP   6 months ago Type 2 diabetes mellitus with microalbuminuria, without long-term current use of insulin [facility])   [place], MD   7 months ago No-show for appointment   [place] Ladell Pier, MD   1 year ago Need for influenza vaccination   [place], RPH-CPP

## 2021-07-07 ENCOUNTER — Telehealth: Payer: Self-pay | Admitting: Internal Medicine

## 2021-07-07 NOTE — Telephone Encounter (Signed)
Copied from [place] [id number]. Topic: General - Other >> Jun 28, 2021 12:34 PM Leward Quan A wrote: Reason for CRM: Patient called in asking to speak to Dr Wynetta Emery say that its in reference to the appointment he had with the Dr that he was referred to. Asking for a call back today if possible from Dr Wynetta Emery at Ph#  [phone]

## 2021-07-07 NOTE — Telephone Encounter (Signed)
Return phone call placed to patient this evening.  I called and left a message on his home phone and cell phone informing him that I was returning his call.  I will try to reach him again sometime tomorrow.

## 2021-07-07 NOTE — Telephone Encounter (Signed)
Pt is requesting a call from pcp

## 2021-07-12 NOTE — Telephone Encounter (Signed)
PC placed to pt this evening.  Pt states he has not received a call as yet from the urologist for the referral.  The referral was submitted last month.  I have sent a message to our referral coordinator to see if she can help him with this.

## 2021-07-14 NOTE — Telephone Encounter (Signed)
Phone call placed to patient today.  I left a message on his mobile voicemail informing him that according to [facility] they already saw him on October 28 of this year.  I do have their notes.  It looks like they had recommended TRUSP.  Advised patient that if he agreed to the procedure he should give them a call back to schedule.  I gave him the phone number for [facility] so that he can call them.

## 2021-07-16 ENCOUNTER — Telehealth: Payer: Self-pay | Admitting: *Deleted

## 2021-07-16 ENCOUNTER — Other Ambulatory Visit: Payer: Self-pay

## 2021-07-16 MED ORDER — ZOSTER VAC RECOMB ADJUVANTED 50 MCG/0.5ML IM SUSR: 0.5000 mL | Freq: Once | INTRAMUSCULAR | 1 refills | Status: AC

## 2021-07-16 NOTE — Telephone Encounter (Signed)
Copied from [place] [id number]. Topic: Appointment Scheduling - Scheduling Inquiry for Clinic >> Jul 15, 2021  2:47 PM Valere Dross wrote: Reason for CRM: Pt called in wanting to know if it is time to get shingles shot, please advise.

## 2021-07-16 NOTE — Telephone Encounter (Signed)
Returned pt call and lvm on both numbers and made pt aware that he can come by and pick up the rx for the Shingles vaccine and take it to an outside pharmacy and if he has any questions or concerns to give [place] a call

## 2021-08-10 ENCOUNTER — Other Ambulatory Visit: Payer: Self-pay

## 2021-08-10 ENCOUNTER — Ambulatory Visit: Payer: Medicare HMO

## 2021-08-10 MED ORDER — ZOSTER VAC RECOMB ADJUVANTED 50 MCG/0.5ML IM SUSR: 0.5000 mL | Freq: Once | INTRAMUSCULAR | 1 refills | Status: AC

## 2021-08-26 ENCOUNTER — Other Ambulatory Visit: Payer: Self-pay | Admitting: Internal Medicine

## 2021-09-07 ENCOUNTER — Telehealth: Payer: Self-pay

## 2021-09-07 NOTE — Telephone Encounter (Signed)
Contacted pt to schedule Medicare Wellness pt didn't answer lvm   °

## 2021-09-15 DIAGNOSIS — R972 Elevated prostate specific antigen [PSA]: Secondary | ICD-10-CM | POA: Diagnosis not present

## 2021-09-15 DIAGNOSIS — D075 Carcinoma in situ of prostate: Secondary | ICD-10-CM | POA: Diagnosis not present

## 2021-09-20 ENCOUNTER — Other Ambulatory Visit: Payer: Self-pay | Admitting: Internal Medicine

## 2021-09-20 DIAGNOSIS — N529 Male erectile dysfunction, unspecified: Secondary | ICD-10-CM

## 2021-09-20 NOTE — Telephone Encounter (Signed)
Requested Prescriptions  Pending Prescriptions Disp Refills   sildenafil (VIAGRA) 100 MG tablet [Pharmacy Med Name: SILDENAFIL CITRATE 100 MG Tablet] 18 tablet 0    Sig: TAKE 1/2 TO 1 TABLET AS NEEDED 1/2 HOUR PRIOR TO INTERCOURSE. LIMIT USE TO 1 TABLET PER 24 HOURS     Urology: Erectile Dysfunction Agents Failed - 09/20/2021  5:06 AM      Failed - Last BP in normal range    BP Readings from Last 1 Encounters:  06/18/21 (!) 148/89         Passed - Valid encounter within last 12 months    Recent Outpatient Visits          3 months ago Type 2 diabetes mellitus with microalbuminuria, without long-term current use of insulin ([place])   [place], Neoma Laming B, MD   8 months ago Type 2 diabetes mellitus with microalbuminuria, without long-term current use of insulin [facility])   [place], Jarome Matin, RPH-CPP   8 months ago Type 2 diabetes mellitus with microalbuminuria, without long-term current use of insulin [facility])   [place], MD   10 months ago No-show for appointment   [place] Ladell Pier, MD   1 year ago Need for influenza vaccination   [place], Jarome Matin, RPH-CPP      Future Appointments            In 1 month Wynetta Emery, Dalbert Batman, MD [place]

## 2021-09-23 DIAGNOSIS — N400 Enlarged prostate without lower urinary tract symptoms: Secondary | ICD-10-CM | POA: Diagnosis not present

## 2021-09-23 DIAGNOSIS — N4232 Atypical small acinar proliferation of prostate: Secondary | ICD-10-CM | POA: Diagnosis not present

## 2021-09-23 DIAGNOSIS — R972 Elevated prostate specific antigen [PSA]: Secondary | ICD-10-CM | POA: Diagnosis not present

## 2021-10-21 ENCOUNTER — Ambulatory Visit: Payer: Medicare HMO | Attending: Internal Medicine | Admitting: Internal Medicine

## 2021-10-21 ENCOUNTER — Other Ambulatory Visit: Payer: Self-pay

## 2021-10-21 DIAGNOSIS — Z91199 Patient's noncompliance with other medical treatment and regimen due to unspecified reason: Secondary | ICD-10-CM

## 2021-10-21 NOTE — Progress Notes (Signed)
No show for this telephone visit

## 2021-11-04 ENCOUNTER — Encounter: Payer: Self-pay | Admitting: Internal Medicine

## 2021-11-04 ENCOUNTER — Ambulatory Visit: Payer: Medicare HMO | Attending: Internal Medicine | Admitting: Internal Medicine

## 2021-11-04 ENCOUNTER — Other Ambulatory Visit: Payer: Self-pay

## 2021-11-04 VITALS — BP 149/82 | HR 117 | Ht 73.0 in | Wt 209.0 lb

## 2021-11-04 DIAGNOSIS — I700 Atherosclerosis of aorta: Secondary | ICD-10-CM | POA: Diagnosis not present

## 2021-11-04 DIAGNOSIS — E1169 Type 2 diabetes mellitus with other specified complication: Secondary | ICD-10-CM | POA: Diagnosis not present

## 2021-11-04 DIAGNOSIS — R809 Proteinuria, unspecified: Secondary | ICD-10-CM

## 2021-11-04 DIAGNOSIS — E1129 Type 2 diabetes mellitus with other diabetic kidney complication: Secondary | ICD-10-CM

## 2021-11-04 DIAGNOSIS — R000 Tachycardia, unspecified: Secondary | ICD-10-CM

## 2021-11-04 DIAGNOSIS — E785 Hyperlipidemia, unspecified: Secondary | ICD-10-CM

## 2021-11-04 DIAGNOSIS — I10 Essential (primary) hypertension: Secondary | ICD-10-CM | POA: Diagnosis not present

## 2021-11-04 LAB — POCT GLYCOSYLATED HEMOGLOBIN (HGB A1C): Hemoglobin A1C: 6.4 % — AB (ref 4.0–5.6)

## 2021-11-04 LAB — GLUCOSE, POCT (MANUAL RESULT ENTRY): POC Glucose: 193 mg/dl — AB (ref 70–99)

## 2021-11-04 MED ORDER — CARVEDILOL 12.5 MG PO TABS: 12.5000 mg | ORAL_TABLET | Freq: Two times a day (BID) | ORAL | 1 refills | Status: DC
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
## 2021-11-04 NOTE — Patient Instructions (Signed)
Your blood pressure is not at goal.  We have increased the Carvedilol to 12. ?

## 2021-11-05 LAB — BASIC METABOLIC PANEL
BUN/Creatinine Ratio: 8 — ABNORMAL LOW (ref 10–24)
BUN: 10 mg/dL (ref 8–27)
CO2: 23 mmol/L (ref 20–29)
Calcium: 10 mg/dL (ref 8.6–10.2)
Chloride: 103 mmol/L (ref 96–106)
Creatinine, Ser: 1.23 mg/dL (ref 0.76–1.27)
Glucose: 147 mg/dL — ABNORMAL HIGH (ref 70–99)
Potassium: 4.3 mmol/L (ref 3.5–5.2)
Sodium: 144 mmol/L (ref 134–144)
eGFR: 63 mL/min/{1.73_m2} (ref >59)

## 2021-11-05 LAB — TSH+T4F+T3FREE
Free T4: 1.14 ng/dL (ref 0.82–1.77)
T3, Free: 2.9 pg/mL (ref 2.0–4.4)
TSH: 2.64 u[IU]/mL (ref 0.450–4.500)

## 2021-11-08 ENCOUNTER — Encounter: Payer: Medicare HMO | Admitting: Internal Medicine

## 2022-02-08 ENCOUNTER — Other Ambulatory Visit: Payer: Self-pay | Admitting: Urology

## 2022-02-08 DIAGNOSIS — R972 Elevated prostate specific antigen [PSA]: Secondary | ICD-10-CM

## 2022-02-08 DIAGNOSIS — N4232 Atypical small acinar proliferation of prostate: Secondary | ICD-10-CM

## 2022-03-02 ENCOUNTER — Inpatient Hospital Stay (HOSPITAL_COMMUNITY)
Admission: EM | Admit: 2022-03-02 | Discharge: 2022-03-04 | DRG: 872 | Disposition: A | Discharge status: Home / Self Care | Payer: Medicare HMO | Attending: Internal Medicine | Admitting: Internal Medicine

## 2022-03-02 ENCOUNTER — Emergency Department (HOSPITAL_COMMUNITY): Payer: Medicare HMO

## 2022-03-02 DIAGNOSIS — Z20822 Contact with and (suspected) exposure to covid-19: Secondary | ICD-10-CM | POA: Diagnosis present

## 2022-03-02 DIAGNOSIS — Z888 Allergy status to other drugs, medicaments and biological substances status: Secondary | ICD-10-CM

## 2022-03-02 DIAGNOSIS — I248 Other forms of acute ischemic heart disease: Secondary | ICD-10-CM | POA: Diagnosis not present

## 2022-03-02 DIAGNOSIS — R52 Pain, unspecified: Secondary | ICD-10-CM | POA: Diagnosis not present

## 2022-03-02 DIAGNOSIS — Z833 Family history of diabetes mellitus: Secondary | ICD-10-CM | POA: Diagnosis not present

## 2022-03-02 DIAGNOSIS — Z7984 Long term (current) use of oral hypoglycemic drugs: Secondary | ICD-10-CM

## 2022-03-02 DIAGNOSIS — Z7982 Long term (current) use of aspirin: Secondary | ICD-10-CM

## 2022-03-02 DIAGNOSIS — A419 Sepsis, unspecified organism: Secondary | ICD-10-CM | POA: Diagnosis not present

## 2022-03-02 DIAGNOSIS — A4151 Sepsis due to Escherichia coli [E. coli]: Principal | ICD-10-CM | POA: Diagnosis present

## 2022-03-02 DIAGNOSIS — M1990 Unspecified osteoarthritis, unspecified site: Secondary | ICD-10-CM | POA: Diagnosis present

## 2022-03-02 DIAGNOSIS — N400 Enlarged prostate without lower urinary tract symptoms: Secondary | ICD-10-CM

## 2022-03-02 DIAGNOSIS — E785 Hyperlipidemia, unspecified: Secondary | ICD-10-CM | POA: Diagnosis not present

## 2022-03-02 DIAGNOSIS — R0789 Other chest pain: Secondary | ICD-10-CM

## 2022-03-02 DIAGNOSIS — R739 Hyperglycemia, unspecified: Secondary | ICD-10-CM | POA: Diagnosis not present

## 2022-03-02 DIAGNOSIS — I10 Essential (primary) hypertension: Secondary | ICD-10-CM | POA: Diagnosis not present

## 2022-03-02 DIAGNOSIS — I700 Atherosclerosis of aorta: Secondary | ICD-10-CM | POA: Diagnosis not present

## 2022-03-02 DIAGNOSIS — R079 Chest pain, unspecified: Secondary | ICD-10-CM | POA: Diagnosis not present

## 2022-03-02 DIAGNOSIS — K5730 Diverticulosis of large intestine without perforation or abscess without bleeding: Secondary | ICD-10-CM | POA: Diagnosis not present

## 2022-03-02 DIAGNOSIS — N390 Urinary tract infection, site not specified: Secondary | ICD-10-CM | POA: Diagnosis not present

## 2022-03-02 DIAGNOSIS — Z79899 Other long term (current) drug therapy: Secondary | ICD-10-CM

## 2022-03-02 DIAGNOSIS — R42 Dizziness and giddiness: Secondary | ICD-10-CM | POA: Diagnosis not present

## 2022-03-02 DIAGNOSIS — R000 Tachycardia, unspecified: Secondary | ICD-10-CM | POA: Diagnosis not present

## 2022-03-02 DIAGNOSIS — R0689 Other abnormalities of breathing: Secondary | ICD-10-CM | POA: Diagnosis not present

## 2022-03-02 DIAGNOSIS — E1169 Type 2 diabetes mellitus with other specified complication: Secondary | ICD-10-CM | POA: Diagnosis not present

## 2022-03-02 DIAGNOSIS — E1165 Type 2 diabetes mellitus with hyperglycemia: Secondary | ICD-10-CM | POA: Diagnosis present

## 2022-03-02 DIAGNOSIS — N3289 Other specified disorders of bladder: Secondary | ICD-10-CM | POA: Diagnosis not present

## 2022-03-02 DIAGNOSIS — R7989 Other specified abnormal findings of blood chemistry: Secondary | ICD-10-CM | POA: Diagnosis not present

## 2022-03-02 DIAGNOSIS — E782 Mixed hyperlipidemia: Secondary | ICD-10-CM | POA: Diagnosis present

## 2022-03-02 DIAGNOSIS — E119 Type 2 diabetes mellitus without complications: Secondary | ICD-10-CM

## 2022-03-02 LAB — COMPREHENSIVE METABOLIC PANEL
ALT: 19 U/L (ref 0–44)
AST: 19 U/L (ref 15–41)
Albumin: 3.3 g/dL — ABNORMAL LOW (ref 3.5–5.0)
Alkaline Phosphatase: 64 U/L (ref 38–126)
Anion gap: 13 (ref 5–15)
BUN: 6 mg/dL — ABNORMAL LOW (ref 8–23)
CO2: 20 mmol/L — ABNORMAL LOW (ref 22–32)
Calcium: 8.7 mg/dL — ABNORMAL LOW (ref 8.9–10.3)
Chloride: 104 mmol/L (ref 98–111)
Creatinine, Ser: 1.14 mg/dL (ref 0.61–1.24)
GFR, Estimated: >60 mL/min (ref >60)
Glucose, Bld: 251 mg/dL — ABNORMAL HIGH (ref 70–99)
Potassium: 3.3 mmol/L — ABNORMAL LOW (ref 3.5–5.1)
Sodium: 137 mmol/L (ref 135–145)
Total Bilirubin: 1.4 mg/dL — ABNORMAL HIGH (ref 0.3–1.2)
Total Protein: 7.2 g/dL (ref 6.5–8.1)

## 2022-03-02 LAB — CBC WITH DIFFERENTIAL/PLATELET
Abs Immature Granulocytes: 0.09 10*3/uL — ABNORMAL HIGH (ref 0.00–0.07)
Basophils Absolute: 0 10*3/uL (ref 0.0–0.1)
Basophils Relative: 0 %
Eosinophils Absolute: 0.1 10*3/uL (ref 0.0–0.5)
Eosinophils Relative: 1 %
HCT: 39.8 % (ref 39.0–52.0)
Hemoglobin: 14 g/dL (ref 13.0–17.0)
Immature Granulocytes: 1 %
Lymphocytes Relative: 4 %
Lymphs Abs: 0.5 10*3/uL — ABNORMAL LOW (ref 0.7–4.0)
MCH: 31.5 pg (ref 26.0–34.0)
MCHC: 35.2 g/dL (ref 30.0–36.0)
MCV: 89.4 fL (ref 80.0–100.0)
Monocytes Absolute: 0.6 10*3/uL (ref 0.1–1.0)
Monocytes Relative: 5 %
Neutro Abs: 10.5 10*3/uL — ABNORMAL HIGH (ref 1.7–7.7)
Neutrophils Relative %: 89 %
Platelets: 157 10*3/uL (ref 150–400)
RBC: 4.45 MIL/uL (ref 4.22–5.81)
RDW: 12.2 % (ref 11.5–15.5)
WBC Morphology: INCREASED
WBC: 11.8 10*3/uL — ABNORMAL HIGH (ref 4.0–10.5)
nRBC: 0 % (ref 0.0–0.2)

## 2022-03-02 LAB — URINALYSIS, ROUTINE W REFLEX MICROSCOPIC
Bilirubin Urine: NEGATIVE
Glucose, UA: 50 mg/dL — AB
Ketones, ur: 5 mg/dL — AB
Nitrite: NEGATIVE
Protein, ur: 100 mg/dL — AB
Specific Gravity, Urine: 1.014 (ref 1.005–1.030)
pH: 6 (ref 5.0–8.0)

## 2022-03-02 LAB — LACTIC ACID, PLASMA
Lactic Acid, Venous: 2.1 mmol/L (ref 0.5–1.9)
Lactic Acid, Venous: 1.6 mmol/L (ref 0.5–1.9)

## 2022-03-02 LAB — TROPONIN I (HIGH SENSITIVITY)
Troponin I (High Sensitivity): 20 ng/L — ABNORMAL HIGH (ref <18)
Troponin I (High Sensitivity): 19 ng/L — ABNORMAL HIGH (ref <18)

## 2022-03-02 LAB — PROTIME-INR
INR: 1.1 (ref 0.8–1.2)
Prothrombin Time: 13.9 seconds (ref 11.4–15.2)

## 2022-03-02 LAB — APTT: aPTT: 28 seconds (ref 24–36)

## 2022-03-02 LAB — RESP PANEL BY RT-PCR (FLU A&B, COVID) ARPGX2
Influenza A by PCR: NEGATIVE
Influenza B by PCR: NEGATIVE
SARS Coronavirus 2 by RT PCR: NEGATIVE

## 2022-03-02 LAB — CBG MONITORING, ED
Glucose-Capillary: 239 mg/dL — ABNORMAL HIGH (ref 70–99)
Glucose-Capillary: 256 mg/dL — ABNORMAL HIGH (ref 70–99)

## 2022-03-02 LAB — D-DIMER, QUANTITATIVE: D-Dimer, Quant: 1.23 ug/mL-FEU — ABNORMAL HIGH (ref 0.00–0.50)

## 2022-03-02 LAB — LIPASE, BLOOD: Lipase: 36 U/L (ref 11–51)

## 2022-03-02 MED ORDER — INSULIN ASPART 100 UNIT/ML IJ SOLN
0.0000 [IU] | Freq: Three times a day (TID) | INTRAMUSCULAR | Status: DC
  Administered 2022-03-02: 5 [IU] via SUBCUTANEOUS
  Administered 2022-03-03: 3 [IU] via SUBCUTANEOUS
  Administered 2022-03-03: 5 [IU] via SUBCUTANEOUS
  Administered 2022-03-03: 3 [IU] via SUBCUTANEOUS

## 2022-03-02 MED ORDER — LACTATED RINGERS IV BOLUS (SEPSIS)
1000.0000 mL | Freq: Once | INTRAVENOUS | Status: AC
  Administered 2022-03-02: 1000 mL via INTRAVENOUS

## 2022-03-02 MED ORDER — VANCOMYCIN HCL 2000 MG/400ML IV SOLN
2000.0000 mg | Freq: Once | INTRAVENOUS | Status: DC
  Administered 2022-03-02: 2000 mg via INTRAVENOUS
  Filled 2022-03-02: qty 400

## 2022-03-02 MED ORDER — LACTATED RINGERS IV SOLN: INTRAVENOUS | Status: DC

## 2022-03-02 MED ORDER — SODIUM CHLORIDE 0.9 % IV SOLN: 2.0000 g | Freq: Three times a day (TID) | INTRAVENOUS | Status: DC

## 2022-03-02 MED ORDER — VANCOMYCIN HCL IN DEXTROSE 1-5 GM/200ML-% IV SOLN
1000.0000 mg | Freq: Once | INTRAVENOUS | Status: DC
  Filled 2022-03-02: qty 200

## 2022-03-02 MED ORDER — VANCOMYCIN HCL 2000 MG/400ML IV SOLN: 2000.0000 mg | INTRAVENOUS | Status: DC

## 2022-03-02 MED ORDER — IOHEXOL 350 MG/ML SOLN
100.0000 mL | Freq: Once | INTRAVENOUS | Status: AC | PRN
  Administered 2022-03-02: 100 mL via INTRAVENOUS

## 2022-03-02 MED ORDER — SODIUM CHLORIDE 0.9 % IV BOLUS: 500.0000 mL | Freq: Once | INTRAVENOUS | Status: DC

## 2022-03-02 MED ORDER — ENOXAPARIN SODIUM 40 MG/0.4ML IJ SOSY
40.0000 mg | PREFILLED_SYRINGE | INTRAMUSCULAR | Status: DC
  Administered 2022-03-02 – 2022-03-04 (×3): 40 mg via SUBCUTANEOUS
  Filled 2022-03-02 (×3): qty 0.4

## 2022-03-02 MED ORDER — ACETAMINOPHEN 325 MG PO TABS
650.0000 mg | ORAL_TABLET | Freq: Once | ORAL | Status: AC
  Administered 2022-03-02: 650 mg via ORAL
  Filled 2022-03-02: qty 2

## 2022-03-02 MED ORDER — SODIUM CHLORIDE 0.9 % IV SOLN
2.0000 g | INTRAVENOUS | Status: DC
  Administered 2022-03-03 – 2022-03-04 (×2): 2 g via INTRAVENOUS
  Filled 2022-03-02 (×2): qty 20

## 2022-03-02 MED ORDER — ACETAMINOPHEN 325 MG PO TABS
650.0000 mg | ORAL_TABLET | Freq: Four times a day (QID) | ORAL | Status: DC | PRN
Start: 2022-03-02 — End: 2022-03-04
  Administered 2022-03-02 – 2022-03-03 (×4): 650 mg via ORAL
  Filled 2022-03-02 (×4): qty 2

## 2022-03-02 MED ORDER — INSULIN ASPART 100 UNIT/ML IJ SOLN
0.0000 [IU] | Freq: Every day | INTRAMUSCULAR | Status: DC
  Administered 2022-03-02: 3 [IU] via SUBCUTANEOUS
  Administered 2022-03-03: 2 [IU] via SUBCUTANEOUS

## 2022-03-02 MED ORDER — LACTATED RINGERS IV SOLN
INTRAVENOUS | Status: AC
Start: 2022-03-02 — End: 2022-03-03

## 2022-03-02 MED ORDER — SENNOSIDES-DOCUSATE SODIUM 8.6-50 MG PO TABS: 1.0000 | ORAL_TABLET | Freq: Every evening | ORAL | Status: DC | PRN

## 2022-03-02 MED ORDER — SODIUM CHLORIDE 0.9 % IV SOLN
2.0000 g | Freq: Once | INTRAVENOUS | Status: AC
  Administered 2022-03-02: 2 g via INTRAVENOUS
  Filled 2022-03-02: qty 12.5

## 2022-03-02 MED ORDER — METRONIDAZOLE 500 MG/100ML IV SOLN
500.0000 mg | Freq: Once | INTRAVENOUS | Status: AC
  Administered 2022-03-02: 500 mg via INTRAVENOUS
  Filled 2022-03-02: qty 100

## 2022-03-02 NOTE — ED Notes (Signed)
Patient transported to X-ray 

## 2022-03-02 NOTE — ED Triage Notes (Signed)
Pt BIB EMS from home for sharp central chest pain since last night, accompanied by SOB and dizziness with standing. Pt also states his knees began to hurt more than normal with this.   324 aspirin, 2 nitro and 250 NS given PTA   130 ST  126/82 CBG 289

## 2022-03-02 NOTE — H&P (Cosign Needed Addendum)
NAME:  Logan Knight, MRN:  [id number], DOB:  06-Jan-1951, LOS: 0 ADMISSION DATE:  03/02/2022, Primary: Marcine Matar, MD  CHIEF COMPLAINT:  chest pain   Medical Service: Internal Medicine Teaching Service         Attending Physician: Dr. Dickie La, MD    First Contact: Dr. Cliffton Asters Pager: [phone]  Second Contact: Dr. Austin Miles Pager: [phone]       After Hours (After 5p/  First Contact Pager: [phone]  weekends / holidays): Second Contact Pager: [phone]   HISTORY OF PRESENT ILLNESS   Logan Knight is 70yo person with hypertension, hyperlipidemia, type II diabetes mellitus, aortic atherosclerosis presenting to [facility] after new onset of right leg pain and chest pain. Logan Knight states he began experiencing medial right upper leg pain, describing debilitating sharp, shooting pains from his groin down his thigh. He has never experienced similar pain prior to this. He denies any strenuous activity or activity out of the normal for him prior to this. During this time, he also reports increase in urinary frequency. He has been experiencing obstructive urinary symptoms, including incomplete bladder emptying and weak stream, for a few months now. Denies any hematuria, dysuria, abdominal pain, pain with defecation. More acutely, he reports sudden-onset of sharp chest pain over the left side of his chest around 10PM last night. Chest pain persisted until he was given nitroglycerin with EMS. He denies any radiation or associated symptoms with this. He did mention some dizziness after he stood up when EMS arrived, but no syncopal episodes. Denies any recent sick contact, fevers, chills, dyspnea, cough, abdominal pain, diarrhea, focal weakness/paresthesias. He is seen by [facility] for primary care needs, last visit a few months ago. Reports compliance with all of his medications.   PCP: Marcine Matar, MD  ED COURSE   Logan Knight was febrile, tachycardic, and hypertensive sating  well on room air upon arrival to [facility]. Lab work revealed leukocytosis, hyperglycemia, evidence of pyuria, and elevated lactate. ED started vancomycin, cefepime, and metronidazole with sepsis protocol. IMTS was subsequently consulted for admission.   PAST MEDICAL HISTORY   He,  has a past medical history of Arthritis, History of blood transfusion (1990), Hypertension, and SBO (small bowel obstruction) (HCC) (09/13/2018).   HOME MEDICATIONS   Prior to Admission medications   Medication Sig Start Date End Date Taking? Authorizing Provider  amLODipine (NORVASC) 10 MG tablet TAKE 1 TABLET EVERY DAY Patient taking differently: Take 10 mg by mouth daily. 12/17/20   Marcine Matar, MD  aspirin EC 81 MG tablet Take 1 tablet (81 mg total) by mouth daily. 02/06/17   Dessa Phi, MD  atorvastatin (LIPITOR) 40 MG tablet TAKE 1 TABLET EVERY DAY 06/28/21   Marcine Matar, MD  Blood Glucose Monitoring Suppl (TRUE METRIX METER) w/Device KIT USE AS DIRECTED 10/21/20   Marcine Matar, MD  carvedilol (COREG) 12.5 MG tablet Take 1 tablet (12.5 mg total) by mouth 2 (two) times daily with a meal. 11/04/21   Marcine Matar, MD  glucose blood (TRUE METRIX BLOOD GLUCOSE TEST) test strip Check blood sugar once daily. Dx: E11.29 01/05/21   Marcine Matar, MD  hydrochlorothiazide (HYDRODIURIL) 25 MG tablet TAKE 1 TABLET EVERY DAY Patient taking differently: Take 25 mg by mouth daily. 11/25/20   Marcine Matar, MD  hydrocortisone (ANUSOL-HC) 2.5 % rectal cream Place 1 application rectally daily as needed for hemorrhoids or anal itching.    [provider]  metFORMIN (GLUCOPHAGE) 1000 MG tablet TAKE 1 TABLET TWICE DAILY WITH MEALS Patient taking differently: Take 1,000 mg by mouth 2 (two) times daily with a meal. 01/20/21   Ladell Pier, MD  Multiple Vitamins-Minerals (MULTIVITAMIN WITH MINERALS) tablet Take 1 tablet by mouth daily. Engineering geologist, Historical, MD  sildenafil  (VIAGRA) 100 MG tablet TAKE 1/2 TO 1 TABLET AS NEEDED 1/2 HOUR PRIOR TO INTERCOURSE. LIMIT USE TO 1 TABLET PER 24 HOURS 09/20/21   Ladell Pier, MD  TRUEplus Lancets 33G MISC TEST BLOOD SUGAR EVERY DAY AS DIRECTED (ONCE) 08/26/21   Ladell Pier, MD   ALLERGIES   Allergies as of 03/02/2022 - Review Complete 03/02/2022  Allergen Reaction Noted   Lisinopril Swelling 12/11/2018   SOCIAL HISTORY   Logan Knight lives in [place] with his wife. Previously worked as a Contractor. At baseline he can complete his ADL's and IADL's independently. Denies previous or current tobacco, alcohol, or recreational drug use.   FAMILY HISTORY   His family history includes Diabetes in his mother.   REVIEW OF SYSTEMS   ROS per history of present illness.  PHYSICAL EXAMINATION   Blood pressure (!) 163/102, pulse (!) 109, temperature 99 F (37.2 C), temperature source Oral, resp. rate (!) 23, weight 94.8 kg, SpO2 98 %.    Filed Weights   03/02/22 0917  Weight: 94.8 kg   GENERAL: Well-kept, but uncomfortable appearing person laying in bed in no acute distress HENT: Normocephalic, atraumatic. Supple neck. Moist mucous membranes. EYES: No scleral icterus or conjunctival injection. Vision grossly in tact CV: Tachycardic, regular rhythm. No murmurs appreciated. Distal pulses 2+ bilaterally. No JVD. PULM: Normal work of breathing on room air. Clear to ausculation bilaterally. GI: Abdomen soft, non-tender, non-distended. Normoactive bowel sounds. MSK: Normal bulk, tone. No pitting edema bilateral lower extremities. Normal ROM of RLE. Bilateral legs appear similar in size, no swelling or pain appreciated in calves. SKIN: Warm, dry. No open wounds appreciated NEURO: Awake, alert, conversing appropriately. Grossly non-focal. Bilateral lower extremities 5/5 in strength, sensation in tact throughout. PSYCH: Normal mood, affect, speech.  SIGNIFICANT DIAGNOSTIC TESTS   ECG: Sinus  tachycardia. No axis deviation. Chronic V1 T-wave inversion.   I personally reviewed patient's ECG with my interpretation as above.  CXR: Normal cardiac contour, good aeration throughout lungs. No change from previous imaging.   I personally reviewed patient's CXR with my interpretation as above.  LABS      Latest Ref Rng & Units 03/02/2022    6:24 AM 06/18/2021    4:30 PM 03/03/2021   11:09 AM  CBC  WBC 4.0 - 10.5 K/uL 11.8  8.5    Hemoglobin 13.0 - 17.0 g/dL 14.0  14.2  14.3   Hematocrit 39.0 - 52.0 % 39.8  41.3  42.0   Platelets 150 - 400 K/uL 157  253        Latest Ref Rng & Units 03/02/2022    6:24 AM 11/04/2021   12:14 PM 06/18/2021    4:30 PM  BMP  Glucose 70 - 99 mg/dL 251  147  110   BUN 8 - 23 mg/dL $Remove'6  10  13   'zcCKvhX$ Creatinine 0.61 - 1.24 mg/dL 1.14  1.23  1.13   BUN/Creat Ratio 10 - $Re'24  8  12   'jVT$ Sodium 135 - 145 mmol/L 137  144  142   Potassium 3.5 - 5.1 mmol/L 3.3  4.3  4.2   Chloride 98 -  111 mmol/L 104  103  105   CO2 22 - 32 mmol/L $RemoveB'20  23  19   'wPOXrlBK$ Calcium 8.9 - 10.3 mg/dL 8.7  10.0  9.9     CONSULTS   N/a  ASSESSMENT   Logan Knight is 71yo person with hypertension, hyperlipidemia, type II diabetes mellitus, aortic atherosclerosis admitted 7/5 with sepsis 2/2 urinary tract infection likely from underlying benign prostatic hyperplasia.   PLAN   Principal Problem:   Sepsis secondary to UTI [facility]) Active Problems:   Hyperlipidemia associated with type 2 diabetes mellitus ([place])   Essential hypertension   Diabetes mellitus treated with oral medication ([place])   BPH (benign prostatic hyperplasia)   Atypical chest pain  #Sepsis 2/2 urinary tract infection  #Benign prostatic hyperplasia Logan Knight initially arrived to [facility] earlier this morning febrile, tachycardic concerning for underlying infection. He has not been having respiratory symptoms and does not have any findings on imaging concerning for pulmonary process. He has been eating/drinking well with normal BM and  no abdominal pain. He is mentating well, no neurologic symptoms or concerns. He does report chronic obstructive urinary symptoms as well as increased urinary frequency over the last few days. UA notable for pyuria, bacteria. I believe most likely this is a complicated urinary tract infection from his BPH. Lactate was 2.1 on arrival, has down-trended with IV fluids. He has maintained his blood pressures and seems to be perfusing well, I do not believe he is in shock currently. Will plan to de-escalate antibiotics - I do not see a previous positive urine culture/sensitivities, will continue with IV Rocephin.  Per PCP notes, prostate biopsy performed earlier this year with urology was negative. Will hold off tamsulosin given acute infection but would consider addition of this prior to discharge. This afternoon, he remains tachycardic despite 3L IVF given in ED. CTA on admission negative for PE. Will give maintenance fluids overnight. - LR 75/hr x12h - Continue IV ceftriaxone daily, needs course for complicated UTI - Follow-up blood, urine cultures  - Add tamsulosin prior to discharge - Strict I/O  #Atypical chest pain Patient reports acute onset of sharp, stabbing pain on left side of chest last night that resolved with nitroglycerin in EMS. Troponin on arrival with mildly elevated trops, 20>19, likely representing increased demand due to sepsis. CTA negative for PE. ECG without any significant or ischemic changes from previous. Patient had CTA 10 years ago without evidence of CAD, last Echo in 2016 normal. Will hold off Echocardiogram now, can consider prior to discharge.  #Type II diabetes mellitus  Sugars significantly elevated >200 on arrival. Last A1c 6.4% 11/04/21. Will repeat this while here. Typically takes metformin at home, will place on sliding scale during acute illness. - Repeat A1c - SSI  #Hypertension Mildly hypertensive since arrival. With his acute infection will hold his home  antihypertensives. - Hold home antihypertensives, likely can re-start in AM if improved  #RLE pain Patient reports shooting, "electric-like" pains on the inside of his right thigh for the last few days. He has not had any recent falls/injuries or increase in activity. I am unsure where this is coming from, possibly an outstretched nerve/muscle? Possibly nerve irritation from acute cystitis? We will continue to monitor, he was not reporting any pain on my examination. If begins to have significant pain, would consider addition of gabapentin for neuropathic pain.   #Hyperlipidemia #Aortic atherosclerosis Chronic, stable issue. On aspirin for primary prevention, can discuss stopping.  - Atorvastatin $RemoveBefore'40mg'[id number]$  daily  #Dizziness  Patient reported some dizziness upon standing earlier this morning when EMS arrived. Had not experienced before and is not  having any currently. Has been having appropriate PO intake. Plan to get orthostatics.  - Follow-up orthostatics  [facility]   DIET: Regular IVF: n/a DVT PPX: lovenox BOWEL: senokot-s CODE: FULL FAM COM: Patient to update his wife himself. Offered to call in the future if needed  DISPO: Admit patient to Inpatient with expected length of stay greater than 2 midnights.  Sanjuan Dame, MD Internal Medicine Resident PGY-3 Pager [phone] 03/02/2022 1:08 PM

## 2022-03-02 NOTE — Sepsis Progress Note (Signed)
eLink monitoring code sepsis.  
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
## 2022-03-02 NOTE — ED Notes (Addendum)
Hepburn MD made aware of raised bumpy area on R forearm, measuring appx 2.5 x 4 inches in length. Pt denies other complaints. Respirations even, unlabored. Pt appears in NAD.

## 2022-03-02 NOTE — Progress Notes (Addendum)
Pharmacy Antibiotic Note  Logan Knight is a 71 y.o. male presented with chest pain and shortness of breath and admitted on 03/02/2022 with concern for sepsis. Tmax 102.9 and WBC 11.8. Pharmacy has been consulted for cefepime and vancomycin  dosing.  AUC goal: 400-550  Plan: Cefepime 2 g q8h Vancomycin '2000mg'$  x1 followed by vancomycin '2000mg'$  q24h (eAUC 480, scr 1.14) F/u renal function, MRSA PCR, and clinical course    Temp (24hrs), Avg:101.1 F (38.4 C), Min:99.4 F (37.4 C), Max:102.9 F (39.4 C)  Recent Labs  Lab 03/02/22 0624 03/02/22 0704  WBC 11.8*  --   CREATININE 1.14  --   LATICACIDVEN  --  2.1*    CrCl cannot be calculated (Unknown ideal weight.).    Allergies  Allergen Reactions   Lisinopril Swelling   Antimicrobials this admission: Cefepime 7/5 > Flagyl 7/5 > Vancomycin 7/5 >  Dose adjustments this admission:  Microbiology results: 7/5 BCx: sent 7/5 UCx: sent   Thank you for allowing pharmacy to be a part of this patient's care.  Levonne Spiller 03/02/2022 9:21 AM

## 2022-03-03 ENCOUNTER — Encounter (HOSPITAL_COMMUNITY): Payer: Self-pay | Admitting: Internal Medicine

## 2022-03-03 ENCOUNTER — Other Ambulatory Visit: Payer: Self-pay

## 2022-03-03 DIAGNOSIS — A419 Sepsis, unspecified organism: Secondary | ICD-10-CM

## 2022-03-03 DIAGNOSIS — N390 Urinary tract infection, site not specified: Secondary | ICD-10-CM | POA: Diagnosis not present

## 2022-03-03 LAB — BASIC METABOLIC PANEL
Anion gap: 11 (ref 5–15)
BUN: 8 mg/dL (ref 8–23)
CO2: 22 mmol/L (ref 22–32)
Calcium: 8.5 mg/dL — ABNORMAL LOW (ref 8.9–10.3)
Chloride: 106 mmol/L (ref 98–111)
Creatinine, Ser: 1.3 mg/dL — ABNORMAL HIGH (ref 0.61–1.24)
GFR, Estimated: 59 mL/min — ABNORMAL LOW (ref >60)
Glucose, Bld: 190 mg/dL — ABNORMAL HIGH (ref 70–99)
Potassium: 3.3 mmol/L — ABNORMAL LOW (ref 3.5–5.1)
Sodium: 139 mmol/L (ref 135–145)

## 2022-03-03 LAB — CBC
HCT: 36.5 % — ABNORMAL LOW (ref 39.0–52.0)
Hemoglobin: 12.5 g/dL — ABNORMAL LOW (ref 13.0–17.0)
MCH: 30.7 pg (ref 26.0–34.0)
MCHC: 34.2 g/dL (ref 30.0–36.0)
MCV: 89.7 fL (ref 80.0–100.0)
Platelets: 153 10*3/uL (ref 150–400)
RBC: 4.07 MIL/uL — ABNORMAL LOW (ref 4.22–5.81)
RDW: 12.5 % (ref 11.5–15.5)
WBC: 12.5 10*3/uL — ABNORMAL HIGH (ref 4.0–10.5)
nRBC: 0 % (ref 0.0–0.2)

## 2022-03-03 LAB — HEMOGLOBIN A1C
Hgb A1c MFr Bld: 6.5 % — ABNORMAL HIGH (ref 4.8–5.6)
Mean Plasma Glucose: 139.85 mg/dL

## 2022-03-03 LAB — GLUCOSE, CAPILLARY
Glucose-Capillary: 215 mg/dL — ABNORMAL HIGH (ref 70–99)
Glucose-Capillary: 194 mg/dL — ABNORMAL HIGH (ref 70–99)
Glucose-Capillary: 233 mg/dL — ABNORMAL HIGH (ref 70–99)
Glucose-Capillary: 185 mg/dL — ABNORMAL HIGH (ref 70–99)
Glucose-Capillary: 203 mg/dL — ABNORMAL HIGH (ref 70–99)

## 2022-03-03 LAB — MRSA NEXT GEN BY PCR, NASAL: MRSA by PCR Next Gen: NOT DETECTED

## 2022-03-03 LAB — HIV ANTIBODY (ROUTINE TESTING W REFLEX): HIV Screen 4th Generation wRfx: NONREACTIVE

## 2022-03-03 MED ORDER — TAMSULOSIN HCL 0.4 MG PO CAPS
0.4000 mg | ORAL_CAPSULE | Freq: Every day | ORAL | Status: DC
  Administered 2022-03-03 – 2022-03-04 (×2): 0.4 mg via ORAL
  Filled 2022-03-03 (×2): qty 1

## 2022-03-03 MED ORDER — ASPIRIN 81 MG PO TBEC
81.0000 mg | DELAYED_RELEASE_TABLET | Freq: Every day | ORAL | Status: DC
  Administered 2022-03-03 – 2022-03-04 (×2): 81 mg via ORAL
  Filled 2022-03-03 (×2): qty 1

## 2022-03-03 MED ORDER — POTASSIUM CHLORIDE CRYS ER 20 MEQ PO TBCR
40.0000 meq | EXTENDED_RELEASE_TABLET | Freq: Once | ORAL | Status: AC
  Administered 2022-03-03: 40 meq via ORAL
  Filled 2022-03-03: qty 2

## 2022-03-03 NOTE — TOC Progression Note (Signed)
Transition of Care [facility]) - Progression Note    Patient Details  Name: GENEVA PALLAS MRN: [id number] Date of Birth: [birth year]  Transition of [facility] CM/SW Contact  Zenon Mayo, RN Phone Number: 03/03/2022, 3:17 PM  Clinical Narrative:    from home with wife, indep, Sepsis, UTI, conts on IV ABX.  TOC following.         Expected Discharge Plan and Services                                                 Social Determinants of Health (SDOH) Interventions    Readmission Risk Interventions     No data to display

## 2022-03-03 NOTE — Hospital Course (Addendum)
Feels a lot better today, has been eating and drinking fine. Stomach pain feels better. Leg pain comes and goes still.   He had a prostate biopsy performed about 8 months ago at [facility]. He was supposed to schedule a follow up MRI of his prostate but this was not performed because of personal reasons. The only time he had discomfort was during the time of biopsy. Had some red urine around that time as well, but this has since resolved.   Prior to admission, noticed that he had to urinate a lot more. Has to go when he has to go, cannot hold it like when he was younger. Sometimes, does not feel like he has completely evacuated his bladder as he has to go back to bathroom quickly.

## 2022-03-03 NOTE — Plan of Care (Signed)
  Problem: Respiratory: Goal: Ability to maintain adequate ventilation will improve Outcome: Progressing   Problem: Coping: Goal: Ability to adjust to condition or change in health will improve Outcome: Progressing   Problem: Fluid Volume: Goal: Ability to maintain a balanced intake and output will improve 03/03/2022 0448 by Debbra Riding, RN Outcome: Progressing 03/03/2022 0447 by Debbra Riding, RN Outcome: Progressing   Problem: Metabolic: Goal: Ability to maintain appropriate glucose levels will improve Outcome: Progressing

## 2022-03-03 NOTE — Progress Notes (Addendum)
HD#1 Subjective:   Logan Knight is a 71 y.o. with a pertinent PMH of hypertension, hyperlipidemia, type II diabetes mellitus, aortic atherosclerosis presenting to [facility] after new onset of right leg pain and chest pain admitted for sepsis 2/2 urinary tract infection likely from underlying BPH.  Overnight: NAEO.  Mr. Acebo states he feels better today and denies any further chest pain. He has been going to the bathroom more often at night recently but denies any burning or discomfort. He has been drinking more water during the day but does not have urinary frequency during the day. Denies knowledge of any fevers or chills at home.   Endorses some sharp pain in inner thigh on rt that extends to knee. Feels more superficial and can be very sensitive to touch. Lasts up to 10 minutes. Nothing seems to bring on or modify the pain. This started Monday and occurs on/off but improving in frequency.  Objective:  Vital signs in last 24 hours: Vitals:   03/03/22 0130 03/03/22 0156 03/03/22 0700 03/03/22 0748  BP: (!) 148/107 (!) 133/93 133/81   Pulse: (!) 112     Resp: (!) 23     Temp:  100.2 F (37.9 C) 99.8 F (37.7 C)   TempSrc:  Oral Oral   SpO2: 99% 98% 98%   Weight:    93.5 kg  Height:    '6\' 1"'$  (1.854 m)   Supplemental O2: Room Air SpO2: 98 %   Physical Exam:  Constitutional: well-appearing male sitting in chair, in no acute distress HENT: normocephalic atraumatic, mucous membranes moist Eyes: conjunctiva non-erythematous Neck: supple Cardiovascular: tachycardic. Regular rhythm, no m/r/g Pulmonary/Chest: normal work of breathing on room air, lungs clear to auscultation bilaterally Abdominal: soft, non-tender, non-distended Ext: No TTP, rash or discoloration at inner right thigh. No palpable cord or swelling.  Neurological: alert & oriented x 3 Skin: warm and dry   Filed Weights   03/02/22 0917 03/03/22 0748  Weight: 94.8 kg 93.5 kg     Intake/Output Summary (Last  24 hours) at 03/03/2022 1312 Last data filed at 03/03/2022 0710 Gross per 24 hour  Intake 830.38 ml  Output 900 ml  Net -69.62 ml   Net IO Since Admission: -869.62 mL [03/03/22 1312]  Pertinent Labs:    Latest Ref Rng & Units 03/03/2022    3:37 AM 03/02/2022    6:24 AM 06/18/2021    4:30 PM  CBC  WBC 4.0 - 10.5 K/uL 12.5  11.8  8.5   Hemoglobin 13.0 - 17.0 g/dL 12.5  14.0  14.2   Hematocrit 39.0 - 52.0 % 36.5  39.8  41.3   Platelets 150 - 400 K/uL 153  157  253        Latest Ref Rng & Units 03/03/2022    3:37 AM 03/02/2022    6:24 AM 11/04/2021   12:14 PM  CMP  Glucose 70 - 99 mg/dL 190  251  147   BUN 8 - 23 mg/dL '8  6  10   '$ Creatinine 0.61 - 1.24 mg/dL 1.30  1.14  1.23   Sodium 135 - 145 mmol/L 139  137  144   Potassium 3.5 - 5.1 mmol/L 3.3  3.3  4.3   Chloride 98 - 111 mmol/L 106  104  103   CO2 22 - 32 mmol/L '22  20  23   '$ Calcium 8.9 - 10.3 mg/dL 8.5  8.7  10.0   Total Protein 6.5 - 8.1 g/dL  7.2    Total Bilirubin 0.3 - 1.2 mg/dL  1.4    Alkaline Phos 38 - 126 U/L  64    AST 15 - 41 U/L  19    ALT 0 - 44 U/L  19      Imaging: No results found.  Assessment/Plan:   Principal Problem:   Sepsis secondary to UTI [facility]) Active Problems:   Hyperlipidemia associated with type 2 diabetes mellitus ([place])   Essential hypertension   Diabetes mellitus treated with oral medication ([place])   BPH (benign prostatic hyperplasia)   Atypical chest pain   Patient Summary: Marques Ericson is 71yo person with hypertension, hyperlipidemia, type II diabetes mellitus, aortic atherosclerosis admitted 7/5 with sepsis 2/2 urinary tract infection likely from underlying benign prostatic hyperplasia.   #Sepsis 2/2 urinary tract infection #Benign prostatic hyperplasia Patient has remained tachycardic since admission but has been afebrile since yesterday afternoon. No signs of respiratory infection or other pulmonary process clinically or on imaging. CTA on admission negative for PE. No neurologic  changes. He does report chronic obstructive urinary sxs with increased frequency more recently. UA was notable for pyuria and bacteria. Urine culture showed 20,000 cfu e coli with sensitivities pending. Lactate initially 2.1 but has downtrended. MAPs >65 since admission. Received 3L IVF in ED as well as vancomycin, cefepime, and metronidazole with sepsis protocol. Possibility that this is cystitis vs prostatitis given CT abd/pelvis findings of diffuse bladder wall thickening with soft tissue stranding and peripheral capsular enhancement of prostate. Negative prostate biopsy previously. Received maintenance fluids initially but since discontinued. - Continue IV ceftriaxone daily as per complicated UTI. Will broaden if fevers again. - Follow-up blood cultures -f/u urine culture sensitivities - Tamsulosin 0.4 mg daily - Strict I/O  #Atypical chest pain Patient initially presented for chest pain that was sharp, stabbing and located at the left side of his left breast. This improved with nitroglycerin via EMS. ECG without any significant changes from previous - no new signs of ischemia. Troponins mildly elevated and downtrending. Likely demand ischemia due to sepsis. CTA negative for PE. Last echo in 2016 normal. No further occurrence of chest pain.  -consider repeat echo prior to discharge or in outpatient setting  #Non-insulin dependent T2DM Sugars elevated upon arrival. Repeat A1c 6.5. Takes metformin at home. -SSI  #HTN Mildly hypertensive since arrival. -Hold home hypertensives given acute infection  #RLE pain Patient initially reported shooting, electric-like pains on the inside of his right thigh extending to just above knee. Denies any recent injury. No swelling, change in coloration. Possible nerve pain from cystitis vs musculoskeletal in etiology. -Venous doppler ordered to r/o DVT given elevated D-dimer  #Hyperlipidemia -ASA 81 mg -Atorvastatin 40 mg daily  #Dizziness Reported  dizziness upon standing with EMS. This has not been present in the past and he has not reported any more episodes since admission.  -encourage PO intake -negative for orthostatic hypotension  DIET: Regular IVF: n/a DVT PPX: lovenox BOWEL: senokot-s CODE: FULL FAM COM: Patient to update his wife himself. Offered to call in the future if needed   DISPO: Admit patient to Inpatient with expected length of stay greater than 2 midnights.  Linward Natal MD Internal Medicine Resident PGY-1 Please contact the on call pager after 5 pm and on weekends at [phone].

## 2022-03-04 ENCOUNTER — Other Ambulatory Visit (HOSPITAL_COMMUNITY): Payer: Self-pay

## 2022-03-04 ENCOUNTER — Inpatient Hospital Stay (HOSPITAL_COMMUNITY): Payer: Medicare HMO

## 2022-03-04 ENCOUNTER — Telehealth: Payer: Self-pay | Admitting: Emergency Medicine

## 2022-03-04 DIAGNOSIS — R52 Pain, unspecified: Secondary | ICD-10-CM | POA: Diagnosis not present

## 2022-03-04 DIAGNOSIS — A419 Sepsis, unspecified organism: Secondary | ICD-10-CM | POA: Diagnosis not present

## 2022-03-04 DIAGNOSIS — N390 Urinary tract infection, site not specified: Secondary | ICD-10-CM | POA: Diagnosis not present

## 2022-03-04 LAB — BASIC METABOLIC PANEL
Anion gap: 11 (ref 5–15)
BUN: 10 mg/dL (ref 8–23)
CO2: 23 mmol/L (ref 22–32)
Calcium: 8.8 mg/dL — ABNORMAL LOW (ref 8.9–10.3)
Chloride: 108 mmol/L (ref 98–111)
Creatinine, Ser: 1.19 mg/dL (ref 0.61–1.24)
GFR, Estimated: >60 mL/min (ref >60)
Glucose, Bld: 153 mg/dL — ABNORMAL HIGH (ref 70–99)
Potassium: 3.1 mmol/L — ABNORMAL LOW (ref 3.5–5.1)
Sodium: 142 mmol/L (ref 135–145)

## 2022-03-04 LAB — CBC
HCT: 37.4 % — ABNORMAL LOW (ref 39.0–52.0)
Hemoglobin: 12.7 g/dL — ABNORMAL LOW (ref 13.0–17.0)
MCH: 30.9 pg (ref 26.0–34.0)
MCHC: 34 g/dL (ref 30.0–36.0)
MCV: 91 fL (ref 80.0–100.0)
Platelets: 170 10*3/uL (ref 150–400)
RBC: 4.11 MIL/uL — ABNORMAL LOW (ref 4.22–5.81)
RDW: 12.5 % (ref 11.5–15.5)
WBC: 11.5 10*3/uL — ABNORMAL HIGH (ref 4.0–10.5)
nRBC: 0 % (ref 0.0–0.2)

## 2022-03-04 LAB — URINE CULTURE: Culture: 20000 — AB

## 2022-03-04 LAB — MAGNESIUM: Magnesium: 1.7 mg/dL (ref 1.7–2.4)

## 2022-03-04 LAB — GLUCOSE, CAPILLARY
Glucose-Capillary: 215 mg/dL — ABNORMAL HIGH (ref 70–99)
Glucose-Capillary: 169 mg/dL — ABNORMAL HIGH (ref 70–99)

## 2022-03-04 MED ORDER — CEFDINIR 300 MG PO CAPS: 300.0000 mg | ORAL_CAPSULE | Freq: Two times a day (BID) | ORAL | Status: DC

## 2022-03-04 MED ORDER — ATORVASTATIN CALCIUM 40 MG PO TABS
40.0000 mg | ORAL_TABLET | Freq: Every day | ORAL | Status: DC
  Administered 2022-03-04: 40 mg via ORAL
  Filled 2022-03-04: qty 1

## 2022-03-04 MED ORDER — POTASSIUM CHLORIDE CRYS ER 20 MEQ PO TBCR
40.0000 meq | EXTENDED_RELEASE_TABLET | Freq: Two times a day (BID) | ORAL | Status: DC
  Administered 2022-03-04: 40 meq via ORAL
  Filled 2022-03-04: qty 2

## 2022-03-04 MED ORDER — INSULIN ASPART 100 UNIT/ML IJ SOLN
0.0000 [IU] | Freq: Three times a day (TID) | INTRAMUSCULAR | Status: DC
  Administered 2022-03-04: 5 [IU] via SUBCUTANEOUS
  Administered 2022-03-04: 3 [IU] via SUBCUTANEOUS

## 2022-03-04 MED ORDER — CARVEDILOL 12.5 MG PO TABS
12.5000 mg | ORAL_TABLET | Freq: Two times a day (BID) | ORAL | Status: DC
  Administered 2022-03-04: 12.5 mg via ORAL
  Filled 2022-03-04: qty 1

## 2022-03-04 MED ORDER — TAMSULOSIN HCL 0.4 MG PO CAPS
0.4000 mg | ORAL_CAPSULE | Freq: Every day | ORAL | 1 refills | Status: DC
  Filled 2022-03-04: qty 30, 30d supply, fill #0

## 2022-03-04 MED ORDER — CEFDINIR 300 MG PO CAPS
300.0000 mg | ORAL_CAPSULE | Freq: Two times a day (BID) | ORAL | 0 refills | Status: DC
  Filled 2022-03-04: qty 22, 11d supply, fill #0
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
## 2022-03-04 NOTE — Telephone Encounter (Signed)
[employer] letter

## 2022-03-04 NOTE — Progress Notes (Signed)
BLE venous duplex has been completed.   Results can be found under chart review under CV PROC. 03/04/2022 11:38 AM Vernel Langenderfer RVT, RDMS

## 2022-03-04 NOTE — Consult Note (Signed)
   [facility] Inpatient Consult   03/04/2022  Logan Knight [birth year] [id number]  [place] [facility] [ACO] Patient: [employer]  Primary Care Provider:  Ladell Pier, MD, [facility]   Patient screened for hospitalization after discussion in unit progression meeting.  Went to the bedside, and patient was currently off the floor. Chart review is currently for no post hospital care coordination needs at this time.  Plan:  Continue to follow progress and disposition to assess for post hospital care management needs.    For questions contact:   Natividad Brood, RN BSN [facility] Liaison  [phone] business mobile phone Toll free office [phone]  Fax number: [phone] [email or web address]
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
## 2022-03-04 NOTE — Care Management Important Message (Signed)
Important Message  Patient Details  Name: Logan [facility] MRN: [id number] Date of Birth: 1951-08-15   Medicare Important Message Given:  Yes     Shelda Altes 03/04/2022, 9:57 AM

## 2022-03-04 NOTE — Progress Notes (Signed)
OT Cancellation Note  Patient Details Name: Logan [facility] MRN: [id number] DOB: [birth year]   Cancelled Treatment:    Reason Eval/Treat Not Completed: OT screened, no needs identified, will sign off.  Pt is independent with all mobility and ADLs at this time.    Chantae Soo OTR/L 03/04/2022, 2:00 PM

## 2022-03-04 NOTE — Progress Notes (Signed)
PT Cancellation Note  Patient Details Name: Logan [facility] MRN: [id number] DOB: [birth year]   Cancelled Treatment:    Reason Eval/Treat Not Completed: PT screened, no needs identified, will sign off. Pt reports independent with all mobility and has been up walking the halls this AM.   [place] 03/04/2022, 9:54 AM [place] Office [phone]

## 2022-03-04 NOTE — TOC Transition Note (Signed)
Transition of Care [facility]) - CM/SW Discharge Note   Patient Details  Name: Logan Knight MRN: [id number] Date of Birth: Oct 31, 1950  Transition of Care [facility]) CM/SW Contact:  Zenon Mayo, RN Phone Number: 03/04/2022, 2:40 PM   Clinical Narrative:    Patient is for dc today, he has cab voucher supplied by CSW .  He has no other needs. TOC to fill meds.          Patient Goals and CMS Choice        Discharge Placement                       Discharge Plan and Services                                     Social Determinants of Health (SDOH) Interventions     Readmission Risk Interventions     No data to display

## 2022-03-07 ENCOUNTER — Telehealth: Payer: Self-pay

## 2022-03-07 LAB — CULTURE, BLOOD (ROUTINE X 2)
Culture: NO GROWTH
Culture: NO GROWTH
Special Requests: ADEQUATE
Special Requests: ADEQUATE

## 2022-03-07 NOTE — Telephone Encounter (Signed)
Transition Care Management Unsuccessful Follow-up Telephone Call  Date of discharge and from where:  03/04/2022, [facility]  Attempts:  1st Attempt  Reason for unsuccessful TCM follow-up call:  Left voice message on # [phone], call back requested.   He has an appointment with Dr Wynetta Emery at [facility] - 04/26/2022,

## 2022-03-07 NOTE — Telephone Encounter (Signed)
Pt was called and informed that paperwork will be given to provider and he will be called once complete.

## 2022-03-08 ENCOUNTER — Telehealth: Payer: Self-pay | Admitting: Emergency Medicine

## 2022-03-08 ENCOUNTER — Telehealth: Payer: Self-pay

## 2022-03-08 NOTE — Telephone Encounter (Signed)
Copied from [place] [id number]. Topic: Appointment Scheduling - Scheduling Inquiry for Clinic >> Mar 08, 2022 12:37 PM [place] B wrote: Reason for [id number]:[id number] called in to schedule hops fu, needs sooner appt. I dont have anything until August he also asked if he needs to schedule an appt with urologist, he says he ws told to get meds from the hospital at [facility]  for 7 days,but he received a 30 day supply. Please cal back to discuss

## 2022-03-08 NOTE — Telephone Encounter (Signed)
Transition Care Management Unsuccessful Follow-up Telephone Call  Date of discharge and from where:  03/04/2022, [facility]  Attempts:  2nd Attempt  Reason for unsuccessful TCM follow-up call:  Left voice message on # [phone], call back requested.  Call also placed to [phone] and the recording stated that the call could not be completed at this time.

## 2022-03-09 ENCOUNTER — Telehealth: Payer: Self-pay

## 2022-03-09 NOTE — Telephone Encounter (Signed)
Transition Care Management Follow-up Telephone Call Date of discharge and from where: 03/04/2022, [facility] How have you been since you were released from the hospital? He stated he is weak; but feeling better than he did last week.  Any questions or concerns? Yes- He thought he was given too many pills when he picked up his new prescriptions.  We reviewed the orders for tamsulosin and he confirmed that he takes it once daily and had 30 capsules.   He thought he had 31 omnicef capsules. We reviewed the order and he was supposed to be given 22 pills to take one twice daily for 11 days.  He confirmed that he is taking it twice daily and then realized he was given the correct number of pills for 11 days.  He has a glucometer but would like to meet with Lurena Joiner to review how to use it. I instructed him to bring his meter with him to his appointment on 03/31/2022 and we will try to have [facility] or one of his students review the use of the meter. I was not able to schedule an appointment with Lurena Joiner for 8/3 when he would be at the clinic for [place].  Items Reviewed: Did the pt receive and understand the discharge instructions provided? Yes  Medications obtained and verified? Yes - he said he has all of his medications.  Questions about meds answered above.  Other? No  Any new allergies since your discharge? No  Dietary orders reviewed? No Do you have support at home? Yes   Home Care and Equipment/Supplies: Were home health services ordered? no If so, what is the name of the agency? N/a  Has the agency set up a time to come to the patient's home? not applicable Were any new equipment or medical supplies ordered?  No What is the name of the medical supply agency? N/a Were you able to get the supplies/equipment? not applicable Do you have any questions related to the use of the equipment or supplies? No  Functional Questionnaire: (I = Independent and D = Dependent) ADLs: independent    Follow up  appointments reviewed:  PCP Hospital f/u appt confirmed? Yes  Scheduled to see Roney Jaffe Clung,PA - 03/31/2022.   [facility] f/u appt confirmed? Yes  Scheduled to see urology- scan on 7/20 and appointment with urologist- 7/27/203   Are transportation arrangements needed? No  If their condition worsens, is the pt aware to call PCP or go to the Emergency Dept.? Yes Was the patient provided with contact information for the PCP's office or ED? Yes Was to pt encouraged to call back with questions or concerns? Yes

## 2022-03-10 NOTE — Telephone Encounter (Signed)
Patient states his concerns have been addressed. Understands his medications and appt. With PCP and Urologist.

## 2022-03-17 DIAGNOSIS — R972 Elevated prostate specific antigen [PSA]: Secondary | ICD-10-CM | POA: Diagnosis not present

## 2022-03-24 DIAGNOSIS — R972 Elevated prostate specific antigen [PSA]: Secondary | ICD-10-CM | POA: Diagnosis not present

## 2022-03-24 DIAGNOSIS — N400 Enlarged prostate without lower urinary tract symptoms: Secondary | ICD-10-CM | POA: Diagnosis not present

## 2022-03-24 DIAGNOSIS — N4232 Atypical small acinar proliferation of prostate: Secondary | ICD-10-CM | POA: Diagnosis not present

## 2022-03-30 NOTE — Progress Notes (Unsigned)
Patient ID: Logan Knight, male   DOB: [birth year], 71 y.o.   MRN: [id number]  After hospitalization 7/5-03/04/2022 Discharge Diagnosis: 1. Principal Problem:   Sepsis secondary to UTI [facility]) Active Problems:   Hyperlipidemia associated with type 2 diabetes mellitus ([place])   Essential hypertension   Diabetes mellitus treated with oral medication ([place])   BPH (benign prostatic hyperplasia)   Atypical chest pain   1.  UTI w/ BPH: Given potential for prostatitis as etiology, he will have a 14 day course of antibiotics (11 days of cefdinir after discharge) and will f/u with [facility] as scheduled (about 2 weeks after discharge). Needs follow up with CBC. F/u with PCP in one month.   RLE pain: Patient reported intermittent superficial pain at right upper thigh. Thought to be msk vs nerve pain. F/u outpatient.   Atypical Chest Pain: Reported to EMS but no further reports during admission. ECG, CTA non-concerning. Likely musculoskeletal in nature. Patient has baseline tachycardia that has been followed in outpatient setting.   Electrolytes: K+ 3.1 and Mg 1.7 today but repleted before discharge. Please follow up with BMP and Mg.   2.  Labs / imaging needed at time of follow-up: CBC, BMP, Mg

## 2022-03-31 ENCOUNTER — Encounter: Payer: Self-pay | Admitting: Physician Assistant

## 2022-03-31 ENCOUNTER — Ambulatory Visit: Payer: Medicare HMO | Attending: Physician Assistant | Admitting: Physician Assistant

## 2022-03-31 VITALS — BP 143/85 | HR 79 | Ht 73.0 in | Wt 208.4 lb

## 2022-03-31 DIAGNOSIS — E1169 Type 2 diabetes mellitus with other specified complication: Secondary | ICD-10-CM | POA: Diagnosis not present

## 2022-03-31 DIAGNOSIS — E785 Hyperlipidemia, unspecified: Secondary | ICD-10-CM

## 2022-03-31 DIAGNOSIS — E876 Hypokalemia: Secondary | ICD-10-CM | POA: Diagnosis not present

## 2022-03-31 DIAGNOSIS — Z09 Encounter for follow-up examination after completed treatment for conditions other than malignant neoplasm: Secondary | ICD-10-CM | POA: Diagnosis not present

## 2022-03-31 DIAGNOSIS — R809 Proteinuria, unspecified: Secondary | ICD-10-CM

## 2022-03-31 DIAGNOSIS — I10 Essential (primary) hypertension: Secondary | ICD-10-CM

## 2022-03-31 DIAGNOSIS — N400 Enlarged prostate without lower urinary tract symptoms: Secondary | ICD-10-CM

## 2022-03-31 DIAGNOSIS — E1129 Type 2 diabetes mellitus with other diabetic kidney complication: Secondary | ICD-10-CM

## 2022-03-31 LAB — GLUCOSE, POCT (MANUAL RESULT ENTRY): POC Glucose: 177 mg/dl — AB (ref 70–99)

## 2022-03-31 LAB — POCT URINALYSIS DIP (CLINITEK)
Bilirubin, UA: NEGATIVE
Glucose, UA: NEGATIVE mg/dL
Ketones, POC UA: NEGATIVE mg/dL
Nitrite, UA: NEGATIVE
POC PROTEIN,UA: 100 — AB
Spec Grav, UA: 1.025 (ref 1.010–1.025)
Urobilinogen, UA: 0.2 E.U./dL
pH, UA: 6 (ref 5.0–8.0)

## 2022-03-31 MED ORDER — AMLODIPINE BESYLATE 10 MG PO TABS: 10.0000 mg | ORAL_TABLET | Freq: Every day | ORAL | 1 refills | Status: DC

## 2022-03-31 MED ORDER — ATORVASTATIN CALCIUM 40 MG PO TABS: 40.0000 mg | ORAL_TABLET | Freq: Every day | ORAL | 3 refills | Status: DC

## 2022-03-31 MED ORDER — TAMSULOSIN HCL 0.4 MG PO CAPS: 0.4000 mg | ORAL_CAPSULE | Freq: Every day | ORAL | 1 refills | Status: DC

## 2022-03-31 MED ORDER — HYDROCHLOROTHIAZIDE 25 MG PO TABS: 25.0000 mg | ORAL_TABLET | Freq: Every day | ORAL | 0 refills | Status: DC

## 2022-04-01 LAB — CBC WITH DIFFERENTIAL/PLATELET
Basophils Absolute: 0.1 10*3/uL (ref 0.0–0.2)
Basos: 1 %
EOS (ABSOLUTE): 0.5 10*3/uL — ABNORMAL HIGH (ref 0.0–0.4)
Eos: 7 %
Hematocrit: 39.1 % (ref 37.5–51.0)
Hemoglobin: 13.7 g/dL (ref 13.0–17.7)
Immature Grans (Abs): 0 10*3/uL (ref 0.0–0.1)
Immature Granulocytes: 0 %
Lymphocytes Absolute: 1.4 10*3/uL (ref 0.7–3.1)
Lymphs: 19 %
MCH: 30.7 pg (ref 26.6–33.0)
MCHC: 35 g/dL (ref 31.5–35.7)
MCV: 88 fL (ref 79–97)
Monocytes Absolute: 0.8 10*3/uL (ref 0.1–0.9)
Monocytes: 11 %
Neutrophils Absolute: 4.7 10*3/uL (ref 1.4–7.0)
Neutrophils: 62 %
Platelets: 191 10*3/uL (ref 150–450)
RBC: 4.46 x10E6/uL (ref 4.14–5.80)
RDW: 12.5 % (ref 11.6–15.4)
WBC: 7.4 10*3/uL (ref 3.4–10.8)

## 2022-04-01 LAB — BASIC METABOLIC PANEL
BUN/Creatinine Ratio: 12 (ref 10–24)
BUN: 14 mg/dL (ref 8–27)
CO2: 23 mmol/L (ref 20–29)
Calcium: 9.9 mg/dL (ref 8.6–10.2)
Chloride: 99 mmol/L (ref 96–106)
Creatinine, Ser: 1.2 mg/dL (ref 0.76–1.27)
Glucose: 155 mg/dL — ABNORMAL HIGH (ref 70–99)
Potassium: 3.8 mmol/L (ref 3.5–5.2)
Sodium: 140 mmol/L (ref 134–144)
eGFR: 65 mL/min/{1.73_m2} (ref >59)

## 2022-04-01 LAB — MAGNESIUM: Magnesium: 1.6 mg/dL (ref 1.6–2.3)

## 2022-04-03 ENCOUNTER — Ambulatory Visit
Admission: RE | Admit: 2022-04-03 | Discharge: 2022-04-03 | Disposition: A | Discharge status: Home / Self Care | Payer: Medicare HMO | Source: Ambulatory Visit | Attending: Urology | Admitting: Urology

## 2022-04-03 DIAGNOSIS — N4232 Atypical small acinar proliferation of prostate: Secondary | ICD-10-CM

## 2022-04-03 DIAGNOSIS — R972 Elevated prostate specific antigen [PSA]: Secondary | ICD-10-CM | POA: Diagnosis not present

## 2022-04-03 LAB — URINE CULTURE

## 2022-04-03 MED ORDER — GADOBENATE DIMEGLUMINE 529 MG/ML IV SOLN
19.0000 mL | Freq: Once | INTRAVENOUS | Status: AC | PRN
  Administered 2022-04-03: 19 mL via INTRAVENOUS

## 2022-04-05 ENCOUNTER — Other Ambulatory Visit: Payer: Self-pay | Admitting: Physician Assistant

## 2022-04-05 MED ORDER — NITROFURANTOIN MONOHYD MACRO 100 MG PO CAPS: 100.0000 mg | ORAL_CAPSULE | Freq: Two times a day (BID) | ORAL | 0 refills | Status: DC

## 2022-04-06 ENCOUNTER — Other Ambulatory Visit: Payer: Self-pay | Admitting: Internal Medicine

## 2022-04-06 DIAGNOSIS — I10 Essential (primary) hypertension: Secondary | ICD-10-CM

## 2022-04-06 NOTE — Telephone Encounter (Signed)
Requested Prescriptions  Pending Prescriptions Disp Refills  . carvedilol (COREG) 12.5 MG tablet [Pharmacy Med Name: CARVEDILOL 12.5 MG Tablet] 180 tablet 1    Sig: TAKE 1 TABLET TWICE DAILY WITH MEALS     Cardiovascular: Beta Blockers 3 Failed - 04/06/2022  3:10 PM      Failed - Last BP in normal range    BP Readings from Last 1 Encounters:  03/31/22 (!) 143/85         Passed - Cr in normal range and within 360 days    Creat  Date Value Ref Range Status  06/09/2016 1.29 (H) 0.70 - 1.25 mg/dL Final    Comment:      For patients > or = 71 years of age: The upper reference limit for Creatinine is approximately 13% higher for people identified as African-American.      Creatinine, Ser  Date Value Ref Range Status  03/31/2022 1.20 0.76 - 1.27 mg/dL Final         Passed - AST in normal range and within 360 days    AST  Date Value Ref Range Status  03/02/2022 19 15 - 41 U/L Final         Passed - ALT in normal range and within 360 days    ALT  Date Value Ref Range Status  03/02/2022 19 0 - 44 U/L Final         Passed - Last Heart Rate in normal range    Pulse Readings from Last 1 Encounters:  03/31/22 79         Passed - Valid encounter within last 6 months    Recent Outpatient Visits          6 days ago Type 2 diabetes mellitus with microalbuminuria, without long-term current use of insulin [facility])   [place], [place], [place]   5 months ago Type 2 diabetes mellitus with microalbuminuria, without long-term current use of insulin ([place])   [place] Ladell Pier, MD   5 months ago No-show for appointment   [place] Karle Plumber B, MD   9 months ago Type 2 diabetes mellitus with microalbuminuria, without long-term current use of insulin [facility])   [place], Deborah B, MD   1 year ago Type 2 diabetes mellitus with  microalbuminuria, without long-term current use of insulin [facility])   [place], RPH-CPP      Future Appointments            In 2 months Wynetta Emery, Dalbert Batman, MD [place]

## 2022-04-15 ENCOUNTER — Other Ambulatory Visit: Payer: Self-pay | Admitting: Internal Medicine

## 2022-04-15 DIAGNOSIS — N529 Male erectile dysfunction, unspecified: Secondary | ICD-10-CM

## 2022-04-19 ENCOUNTER — Telehealth: Payer: Self-pay | Admitting: Internal Medicine

## 2022-04-19 NOTE — Telephone Encounter (Signed)
Patient called in about referral he says he had for neurologist, and had his prostate examed. He wasn't really sure Dr Gloriann Loan is really a neurologist, but then he states Dr Gloriann Loan referred him to a different Dr to do some type of procedure and he doesn't understand what that's for and wants to speak with Dr Wynetta Emery about it.

## 2022-04-20 NOTE — Telephone Encounter (Signed)
Patient called in on yesterday wanting to know if we are able to give him information about procedure and appt that was discussed with Dr. Gloriann Loan. Advised patient only information that was seen in the chart relating to Dr. Gloriann Loan was the referral that was already placed and MRI that was done on 86/2023.  He states she left a voicemail with Dr. Purvis [place] nurse on yesterday asking her to return his call.  He states normally they will call him back after 2 pm. Advise patient to f/u with his nurse today if with another phone call.   Recommended patient have a relative or close friend at appt to help him understand discussion(s) he has with medical staff at Urology office to help with the best options going forward.

## 2022-04-26 ENCOUNTER — Ambulatory Visit: Payer: Medicare HMO | Admitting: Internal Medicine

## 2022-04-28 DIAGNOSIS — R972 Elevated prostate specific antigen [PSA]: Secondary | ICD-10-CM | POA: Diagnosis not present

## 2022-04-28 DIAGNOSIS — N414 Granulomatous prostatitis: Secondary | ICD-10-CM | POA: Diagnosis not present

## 2022-04-28 DIAGNOSIS — N400 Enlarged prostate without lower urinary tract symptoms: Secondary | ICD-10-CM | POA: Diagnosis not present

## 2022-06-12 ENCOUNTER — Other Ambulatory Visit: Payer: Self-pay | Admitting: Physician Assistant

## 2022-06-12 DIAGNOSIS — I10 Essential (primary) hypertension: Secondary | ICD-10-CM

## 2022-06-13 NOTE — Telephone Encounter (Signed)
Requested Prescriptions  Pending Prescriptions Disp Refills  . hydrochlorothiazide (HYDRODIURIL) 25 MG tablet [Pharmacy Med Name: HYDROCHLOROTHIAZIDE 25 MG Tablet] 90 tablet 0    Sig: TAKE 1 TABLET EVERY DAY     Cardiovascular: Diuretics - Thiazide Failed - 06/12/2022  4:09 AM      Failed - Last BP in normal range    BP Readings from Last 1 Encounters:  03/31/22 (!) 143/85         Passed - Cr in normal range and within 180 days    Creat  Date Value Ref Range Status  06/09/2016 1.29 (H) 0.70 - 1.25 mg/dL Final    Comment:      For patients > or = 71 years of age: The upper reference limit for Creatinine is approximately 13% higher for people identified as African-American.      Creatinine, Ser  Date Value Ref Range Status  03/31/2022 1.20 0.76 - 1.27 mg/dL Final         Passed - K in normal range and within 180 days    Potassium  Date Value Ref Range Status  03/31/2022 3.8 3.5 - 5.2 mmol/L Final         Passed - Na in normal range and within 180 days    Sodium  Date Value Ref Range Status  03/31/2022 140 134 - 144 mmol/L Final         Passed - Valid encounter within last 6 months    Recent Outpatient Visits          2 months ago Type 2 diabetes mellitus with microalbuminuria, without long-term current use of insulin [facility])   [place], [place], [place]   7 months ago Type 2 diabetes mellitus with microalbuminuria, without long-term current use of insulin [facility]   [place] Ladell Pier, MD   7 months ago No-show for appointment   [place] Karle Plumber B, MD   12 months ago Type 2 diabetes mellitus with microalbuminuria, without long-term current use of insulin [facility])   [place], Deborah B, MD   1 year ago Type 2 diabetes mellitus with microalbuminuria, without long-term current use of insulin [facility])   [place], RPH-CPP      Future Appointments            In 3 weeks Ladell Pier, MD [place]

## 2022-07-04 ENCOUNTER — Ambulatory Visit: Payer: Medicare HMO | Admitting: Internal Medicine

## 2022-07-05 ENCOUNTER — Ambulatory Visit: Payer: Medicare HMO | Attending: Internal Medicine | Admitting: Internal Medicine

## 2022-07-05 ENCOUNTER — Encounter: Payer: Self-pay | Admitting: Internal Medicine

## 2022-07-05 VITALS — BP 149/84 | HR 89 | Temp 97.9°F | Ht 73.0 in | Wt 211.0 lb

## 2022-07-05 DIAGNOSIS — E1159 Type 2 diabetes mellitus with other circulatory complications: Secondary | ICD-10-CM

## 2022-07-05 DIAGNOSIS — E1169 Type 2 diabetes mellitus with other specified complication: Secondary | ICD-10-CM

## 2022-07-05 DIAGNOSIS — N400 Enlarged prostate without lower urinary tract symptoms: Secondary | ICD-10-CM | POA: Diagnosis not present

## 2022-07-05 DIAGNOSIS — E1129 Type 2 diabetes mellitus with other diabetic kidney complication: Secondary | ICD-10-CM | POA: Diagnosis not present

## 2022-07-05 DIAGNOSIS — R809 Proteinuria, unspecified: Secondary | ICD-10-CM | POA: Diagnosis not present

## 2022-07-05 DIAGNOSIS — E785 Hyperlipidemia, unspecified: Secondary | ICD-10-CM | POA: Diagnosis not present

## 2022-07-05 DIAGNOSIS — I152 Hypertension secondary to endocrine disorders: Secondary | ICD-10-CM | POA: Diagnosis not present

## 2022-07-05 DIAGNOSIS — Z23 Encounter for immunization: Secondary | ICD-10-CM

## 2022-07-05 LAB — GLUCOSE, POCT (MANUAL RESULT ENTRY): POC Glucose: 251 mg/dl — AB (ref 70–99)

## 2022-07-05 LAB — POCT GLYCOSYLATED HEMOGLOBIN (HGB A1C): HbA1c, POC (controlled diabetic range): 6.2 % (ref 0.0–7.0)

## 2022-07-05 MED ORDER — METFORMIN HCL 1000 MG PO TABS: 1000.0000 mg | ORAL_TABLET | Freq: Two times a day (BID) | ORAL | 0 refills | Status: DC

## 2022-07-05 MED ORDER — CARVEDILOL 12.5 MG PO TABS: 12.5000 mg | ORAL_TABLET | Freq: Two times a day (BID) | ORAL | 1 refills | Status: DC

## 2022-07-05 MED ORDER — HYDROCHLOROTHIAZIDE 25 MG PO TABS: 25.0000 mg | ORAL_TABLET | Freq: Every day | ORAL | 0 refills | Status: DC
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
## 2022-07-06 LAB — HEPATIC FUNCTION PANEL
ALT: 23 IU/L (ref 0–44)
AST: 28 IU/L (ref 0–40)
Albumin: 4.5 g/dL (ref 3.9–4.9)
Alkaline Phosphatase: 70 IU/L (ref 44–121)
Bilirubin Total: 0.4 mg/dL (ref 0.0–1.2)
Bilirubin, Direct: 0.16 mg/dL (ref 0.00–0.40)
Total Protein: 7.5 g/dL (ref 6.0–8.5)

## 2022-07-06 LAB — LIPID PANEL
Chol/HDL Ratio: 4.3 ratio (ref 0.0–5.0)
Cholesterol, Total: 145 mg/dL (ref 100–199)
HDL: 34 mg/dL — ABNORMAL LOW (ref >39)
LDL Chol Calc (NIH): 55 mg/dL (ref 0–99)
Triglycerides: 366 mg/dL — ABNORMAL HIGH (ref 0–149)
VLDL Cholesterol Cal: 56 mg/dL — ABNORMAL HIGH (ref 5–40)

## 2022-07-13 ENCOUNTER — Telehealth: Payer: Self-pay | Admitting: Internal Medicine

## 2022-07-13 NOTE — Telephone Encounter (Signed)
-----   Message from Carilyn Goodpasture, RN sent at 07/08/2022  8:39 AM EST -----  ----- Message ----- From: Elliot Cousin, RN Sent: 07/07/2022   4:09 PM EST To: [facility] Clinical Pool  Result note read to pt, verbalizes understanding. Pt states discussed discontinuing ASA '81mg'$  at OV but "Went on to other subject." Questioning if he should continue ASA. States may leave message on home phone. [phone] Please advise.

## 2022-07-14 NOTE — Telephone Encounter (Signed)
VM left informing patient hof PCP response.

## 2022-07-15 NOTE — Telephone Encounter (Signed)
Call placed to pt and VM was left.

## 2022-07-22 ENCOUNTER — Encounter: Payer: Self-pay | Admitting: Internal Medicine

## 2022-07-22 NOTE — Progress Notes (Signed)
Records received from [facility] urology.  Patient had prostate biopsy 08/2021 that showed some atypia on pathology report.  Plan was to do MRI of the prostate in 6 months.  Patient had MRI done 03/2022.  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 MRI/ultrasound-guided biopsy done 03/2022.  All 12 pieces of biopsy specimen showed benign prostate tissue.  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

## 2022-07-25 ENCOUNTER — Other Ambulatory Visit: Payer: Self-pay

## 2022-07-25 NOTE — Patient Outreach (Signed)
Aging Gracefully Program  07/25/2022  Logan Knight 12-27-1950 [id number]   [facility] Evaluation Interviewer made contact with patient. Aging Gracefully survey completed.   Interviewer will send referral to RN and OT for follow up.   [place] Management Assistant [phone]

## 2022-07-29 ENCOUNTER — Ambulatory Visit: Payer: Medicare HMO | Attending: Internal Medicine

## 2022-07-29 DIAGNOSIS — Z0000 Encounter for general adult medical examination without abnormal findings: Secondary | ICD-10-CM | POA: Diagnosis not present

## 2022-07-29 NOTE — Patient Instructions (Addendum)
Mr. Logan Knight , Thank you for taking time to come for your Medicare Wellness Visit. I appreciate your ongoing commitment to your health goals. Please review the following plan we discussed and let me know if I can assist you in the future.   These are the goals we discussed:  Goals      Blood Pressure < 140/90     HEMOGLOBIN A1C < 7.0        This is a list of the screening recommended for you and due dates:  Health Maintenance  Topic Date Due   Complete foot exam   07/14/2020   Eye exam for diabetics  01/27/2021   Medicare Annual Wellness Visit  03/18/2021   Yearly kidney health urinalysis for diabetes  01/05/2022   COVID-19 Vaccine (3 - 2023-24 season) 04/29/2022   Hemoglobin A1C  01/03/2023   Yearly kidney function blood test for diabetes  04/01/2023   Colon Cancer Screening  10/23/2025   DTaP/Tdap/Td vaccine (3 - Td or Tdap) 02/16/2030   Pneumonia Vaccine  Completed   Flu Shot  Completed   Hepatitis C Screening: USPSTF Recommendation to screen - Ages 54-79 yo.  Completed   Zoster (Shingles) Vaccine  Completed   HPV Vaccine  Aged Out  Health Maintenance After Age 52 After age 78, you are at a higher risk for certain long-term diseases and infections as well as injuries from falls. Falls are a major cause of broken bones and head injuries in people who are older than age 30. Getting regular preventive care can help to keep you healthy and well. Preventive care includes getting regular testing and making lifestyle changes as recommended by your health care provider. Talk with your health care provider about: Which screenings and tests you should have. A screening is a test that checks for a disease when you have no symptoms. A diet and exercise plan that is right for you. What should I know about screenings and tests to prevent falls? Screening and testing are the best ways to find a health problem early. Early diagnosis and treatment give you the best chance of managing medical  conditions that are common after age 22. Certain conditions and lifestyle choices may make you more likely to have a fall. Your health care provider may recommend: Regular vision checks. Poor vision and conditions such as cataracts can make you more likely to have a fall. If you wear glasses, make sure to get your prescription updated if your vision changes. Medicine review. Work with your health care provider to regularly review all of the medicines you are taking, including over-the-counter medicines. Ask your health care provider about any side effects that may make you more likely to have a fall. Tell your health care provider if any medicines that you take make you feel dizzy or sleepy. Strength and balance checks. Your health care provider may recommend certain tests to check your strength and balance while standing, walking, or changing positions. Foot health exam. Foot pain and numbness, as well as not wearing proper footwear, can make you more likely to have a fall. Screenings, including: Osteoporosis screening. Osteoporosis is a condition that causes the bones to get weaker and break more easily. Blood pressure screening. Blood pressure changes and medicines to control blood pressure can make you feel dizzy. Depression screening. You may be more likely to have a fall if you have a fear of falling, feel depressed, or feel unable to do activities that you used to do. Alcohol use  screening. Using too much alcohol can affect your balance and may make you more likely to have a fall. Follow these instructions at home: Lifestyle Do not drink alcohol if: Your health care provider tells you not to drink. If you drink alcohol: Limit how much you have to: 0-1 drink a day for women. 0-2 drinks a day for men. Know how much alcohol is in your drink. In the [place] one drink equals one 12 oz bottle of beer (355 mL), one 5 oz glass of wine (148 mL), or one 1 oz glass of hard liquor (44 mL). Do not use  any products that contain nicotine or tobacco. These products include cigarettes, chewing tobacco, and vaping devices, such as e-cigarettes. If you need help quitting, ask your health care provider. Activity  Follow a regular exercise program to stay fit. This will help you maintain your balance. Ask your health care provider what types of exercise are appropriate for you. If you need a cane or walker, use it as recommended by your health care provider. Wear supportive shoes that have nonskid soles. Safety  Remove any tripping hazards, such as rugs, cords, and clutter. Install safety equipment such as grab bars in bathrooms and safety rails on stairs. Keep rooms and walkways well-lit. General instructions Talk with your health care provider about your risks for falling. Tell your health care provider if: You fall. Be sure to tell your health care provider about all falls, even ones that seem minor. You feel dizzy, tiredness (fatigue), or off-balance. Take over-the-counter and prescription medicines only as told by your health care provider. These include supplements. Eat a healthy diet and maintain a healthy weight. A healthy diet includes low-fat dairy products, low-fat (lean) meats, and fiber from whole grains, beans, and lots of fruits and vegetables. Stay current with your vaccines. Schedule regular health, dental, and eye exams. Summary Having a healthy lifestyle and getting preventive care can help to protect your health and wellness after age 38. Screening and testing are the best way to find a health problem early and help you avoid having a fall. Early diagnosis and treatment give you the best chance for managing medical conditions that are more common for people who are older than age 49. Falls are a major cause of broken bones and head injuries in people who are older than age 71. Take precautions to prevent a fall at home. Work with your health care provider to learn what changes  you can make to improve your health and wellness and to prevent falls. This information is not intended to replace advice given to you by your health care provider. Make sure you discuss any questions you have with your health care provider. Document Revised: 01/04/2021 Document Reviewed: 01/04/2021 Elsevier Patient Education  [place].
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
## 2022-08-01 ENCOUNTER — Other Ambulatory Visit: Payer: Self-pay | Admitting: Specialist

## 2022-08-02 NOTE — Patient Outreach (Signed)
Aging Gracefully Program  OT Initial Visit  08/02/2022  Logan Knight April 07, 1951 [id number]  Visit:  1- Initial Visit  Start Time:  4034 End Time:  7425 Total Minutes:  40  CCAP: Typical Daily Routine: Typical Daily Routine:: [name] is a 71 year old male  with high blood pressure, diabetes, arthritis, knee pain, and a limp.  He is married and works part time as a Sports coach at [employer].  He enjoys his work and Comptroller with friends. What Types Of Care Problems Are You Having Throughout The Day?: limp makes walking long distances difficult, getting on and off bus difficult What Kind Of Help Do You Receive?: none Do You Think You Need Other Types Of Help?: none What Do You Think Would Make Everyday Life Easier For You?: bathroom modifications and railing on steps What Is A Good Day Like?: no pain in knee What Is A Bad Day Like?: increased pain in knee Do You Have Time For Yourself?: yes Patient Reported Equipment:   Functional Mobility-Walking Indoors/Getting Around the House:   Functional Mobility-Walk A Block: Walk A Block: A Little Difficulty Do You:: No Device/No Assistance Importance Of Learning New Strategies:: Not At All Functional Mobility-Maintain Balance While Showering: Maintaining Balance While Showering: A Little Difficulty Do You:: No Device/No Assistance Importance Of Learning New Strategies:: Moderate Observation: Maintain Balance While Showering: Independent With Pain, Difficulty, Or Use Of Device Intervention: Yes Functional Mobility-Stooping, Crouching, Kneeling To Retreive Item: Stooping, Crouching, or Kneeling To Retrieve Item: A Lot Of Difficulty Do You:: No Device/No Assistance Importance Of Learning New Strategies:: Very Much Intervention: Yes Functional Mobility-Bending From Standing Position To Pick Up Clothing Off The Floor: Bending Over From Standing Position To Pick Up Clothing Off The Floor: A Lot Of Difficulty Do You:: No  Device/No Assistance Importance Of Learning New Strategies:: Very Much Intervention: Yes Functional Mobility-Reaching For Items Above Shoulder Level: Reaching For Items Above Shoulder Level: No Difficulty Functional Mobility-Climb 1 Flight Of Stairs: Climb 1 Flight Of Stairs: A Little Difficulty Do You:: No Device/No Assistance Importance Of Learning New Strategies:: A Little Other Comments:: limps Functional Mobility-Move In And Out Of Chair: Move In and Out Of A Chair: No Difficulty Functional Mobility-Move In And Out Of Bed: Move In and Out Of Bed: No Difficulty Functional Mobility-Move In And Out Of Bath/Shower: Move In And Out Of A Bath/Shower: A Little Difficulty Do You:: No Device/No Assistance Importance Of Learning New Strategies:: Very Much Intervention: Yes Functional Mobility-Get On And Off Toilet: Getting Up From The Floor: A Little Difficulty Functional Mobility-Into And Out Of Car, Not Including Driving: Into  And Out Of Car, Not Including Driving: Moderate Difficulty Do You:: No Device/No Assistance Importance Of Learning New Strategies:: A Little Other Comments:: limp and knee arthritis Functional Mobility-Other Mobility Difficulty:      Activities of Daily Living-Bathing/Showering: ADL-Bathing/Showering: No Difficulty Activities of Daily Living-Personal Hygiene and Grooming: Personal Hygiene and Grooming: No Difficulty Activities of Daily Living-Toilet Hygiene: Toilet Hygiene: No Difficulty Activities of Daily Living-Put On And Take Off Undergarments (Incl. Fasteners): Put On And Take Off Undergarments (Incl. Fasteners): No Difficulty Activities of Daily Living-Put On And Take Off Shirt/Dress/Coat (Incl. Fasteners): Put On And Take Off Shirt/Dress/Coat (Incl. Fasteners): No Difficulty Activities of Daily Living-Put On And Take Off Socks And Shoes: Put On And Take Off Socks And  Shoes: No Difficulty Activities of Daily Living-Feed Self: Feed Self: No  Difficulty Activities of Daily Living-Rest And Sleep: Rest and Sleep: No Difficulty  Activities of Daily Living-Sexual Activity: Sexual  Activity: N/A Activities of Daily Living-Other Activity Identified:    Instrumental Activities of Daily Living-Light Homemaking (Laundry, Straightening Up, Vacuuming):  Do Light Homemaking (Laundry, Straightening Up, Vacuuming): No Difficulty Instrumental Activities of Daily Living-Making A Bed: Making a Bed: No Difficulty Instrumental Activities of Daily Living-Washing Dishes By Hand While Standing At The Sink: Washing Dishes By Hand While Standing At The Sink: No Difficulty Instrumental Activities of Daily Living-Grocery Shopping: Do Grocery Shopping: No Difficulty Instrumental Activities of Daily Living-Use Telephone: Use Telephone: No Difficulty Instrumental Activities of Daily Living-Financial Management: Financial Management: No Difficulty Instrumental Activities of Daily Living-Medications: Take Medications: No Difficulty Instrumental Activities of Daily Living-Health Management And Maintenance: Health Management & Maintenance: No Difficulty Instrumental Activities of Daily Living-Meal Preparation and Clean-Up: Meal Preparation and Clean-Up: No Difficulty Instrumental Activities of Daily Living-Provide Care For Others/Pets: Do You:: N/A-Not Applicable Instrumental Activities of Daily Living-Take Part In Organized Social Activities: Take Part In Organized Social Activities: No Difficulty Instrumental Activities of Daily Living-Leisure Participation: Leisure Participation: No Difficulty Instrumental Activities of Daily Living-Employment/Volunteer Activities: Employment/Volunteer Activities: A Little Difficulty Do You:: No Device/No Assistance Importance Of Learning New Strategies: Not At All Other Comments:: limp sometimes interferes with work Instrumental Activities of Daily Living-Other Identifies:    Readiness To Change Score:   Readiness to Change Score: 10  Home Environment Assessment: Outside Home Entry:: front entrance needs railings and perhaps a grab bar at entrance, back entrance needs railing, support needs secured for roof, and seal around door needs replaced, would benefit from a new storm door. Entryway/Foyer:: WNL Dining Room:: WNL Living Room:: WNL Kitchen:: WNL Stairs:: WNL Bathroom:: bathroom is quite small.  patient would benefit from a walk in shower and grab bars for impoved safety, as well as a chair level commode. Master Bedroom:: WNL Laundry:: WNL Basement:: WNL Hallways:: WNL Smoke/CO2 Detector:: WNL Veterinary surgeon:: WNL  Durable Medical Equipment:    Patient Education: Education Provided: Yes Education Details: safety checklist to prevent falls in the home Person(s) Educated: Patient, Spouse Comprehension: Verbalized Understanding  Goals:  Goals Addressed             This Visit's Progress    Patient Stated       Patient will improve safety and independence getting in and out of shower.      Patient Stated       Patient will improve safety getting items off of the floor.     Patient Stated       Patient will improve safety entering and exiting home.        Post Clinical Reasoning: Clinician View Of Client Situation:: Mr. Arteaga is doing well.  He works part time as a Sports coach and is able to take the bus to and from work.  He is fearful of falling in the shower and is looking forward to modifications to improve his safety and his confidence. [name]: Doing well.  Has a limp and must be cognizant of this as he moves about at home and in the community. Next Visit Plan:: Review how to safely get up from a fall.  Problem solve goal 1 how to safely pick items up from the ground.  discuss need for commode modification.  Vangie Bicker, [place], OT/L [phone]

## 2022-08-04 ENCOUNTER — Telehealth: Payer: Self-pay

## 2022-08-04 NOTE — Addendum Note (Signed)
Addended by: Gomez Cleverly on: 08/04/2022 09:53 AM   Modules accepted: Level of Service

## 2022-08-04 NOTE — Patient Outreach (Signed)
Aging Gracefully Program  08/04/2022  THESEUS BIRNIE October 11, 1950 [id number]   Placed call to patient and wife to explain reason for call.  Need for RN home visit for Aging Gracefully program.  Scheduled appointment for 08/09/2022  at [place], BSN, CEN RN Case Freight forwarder for [place] Mobile: [phone]

## 2022-08-09 ENCOUNTER — Other Ambulatory Visit: Payer: Self-pay

## 2022-08-09 NOTE — Patient Outreach (Signed)
Aging Gracefully Program  RN Visit  08/09/2022  Logan Knight [birth year] [id number]  Visit:  RN Visit Number: 1- Initial Visit  RN TIME CALCULATION: Start TIme:  RN Start Time Calculation: 1300 End Time:  RN Stop Time Calculation: 1400 Total Minutes:  RN Time Calculation: 60  Readiness To Change Score:     Universal RN Interventions: Calendar Distribution: Yes Exercise Review: No Medications: Yes Medication Changes: No Mood: Yes Pain: Yes PCP Advocacy/Support: Yes Fall Prevention: Yes Incontinence: Yes Clinician View Of Client Situation: Well dressed. ambulatory to the living room.  Awake and alert.  engaged in conversation without difficulty. no assistive devices [name] Patient reports that his DM is under good control. denies checking his CBG. Reports his biggest concern is his left knee pain. States that he just tolerates it..  Wear a knee sleeve or brace when he goes to work.  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  Healthcare Provider Communication: Did Higher education careers adviser With [employer] Provider?: No According to Client, Did PCP Report Communication With An Aging Gracefully RN?: No  Clinician View of Client Situation: Clinician View Of Client Situation: Well dressed. ambulatory to the living room.  Awake and alert.  engaged in conversation without difficulty. no assistive devices [name] [name] Patient reports that his DM is under good control. denies checking his CBG. Reports his biggest concern is his left knee pain. States that he just tolerates it..  Wear a knee sleeve or brace when he goes to work.  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  Medication Assessment: Do You Have Any Problems Paying For Medications?: No Where Does Client Store Medications?:  (bedroom) Can Client Read Pill Bottles?: Yes Does Client Use A Pillbox?: No Does Anyone Assist Client In Taking  Medications?: No Do You Take Vitamin D?: No Does Client Have Any Questions Or Concerns About Medictions?: No Is Client Complaining Of Any Symptoms That Could Be Side Effects To Medications?: No Any Possible Changes In Medication Regimen?: No  OT Update: pending modifications  Session Summary: CLIENT/RN ACTION PLAN - PAIN  Registered Nurse:  Tomasa Rand RN Date:08/09/2022  Client Name:Logan Knight Client ID:    Target Area:  PAIN   Why Problem May Occur: arthritis   Target Goal: Patient will report decrease in pain of the left knee in the next 120 days  STRATEGIES Coping Strategies Ideas:  Heat  Reviewed 08/09/2022 Use heating pad or warm towel on painful area no more than 20 minutes at a time. Don't sleep with a heating pad on - it could burn your skin   Ice  Reviewed 08/09/2022 Use ice pack or frozen bag of vegetables on painful area.   Leave cold pack on for less than 20 minutes. Ice can burn your skin.  Don't leave ice on longer than 20 minutes.   Activity and Exercise  Reviewed 08/09/2022 Joints get stiff when not in use Aging Gracefully Exercises Walking (inside or outside) [employer]:  cooking, cleaning, Psychologist, forensic   Listen to Music Listening to music can decrease pain. Turn off the TV and turn on the radio.   Prayer/Meditation Prayer and meditation can decrease pain   Other   Acetaminophen/Tylenol (same medication)  Reviewed 08/09/2022  Suggested OTC rubs or cream like Voltaren Gel DO NOT take more Acetaminophen than below because it can be bad for your liver. 500 mg. tablets:  2 tablets every  8 hours, as needed for pain.  Do not take more than 6, (500 mg) tablets every day. 300 mg. tablets:  2 tablets every 6 hours, as needed for pain.  Do not take more than 8 (325 mg) tablets every day. Look for Acetaminophen/Tylenol in other medicines you buy over the counter.   Still in Pain There ae a lot of different kinds of pain  medicines:  creams, patches and supplements. Ask your Healthcare Provider about other pain medications.   Stop   112/07/2022     NA Smoking can make arthritis worse.   Stop   Reviewed 08/09/2022 Before it gets bad. Once in pain It is harder to get rid of. Begin pain relief while you have mild pain.   Other   Other    ;  PRACTICE It is important to practice the strategies so we can determine if they will be effective in helping to reach the goal.    Follow these specific recommendations:        If strategy does not work the first time, try it again.  We may make some changes over the next few sessions.    We may make some changes over the next few sessions, based on how they work.   Tomasa Rand RN, BSN, CEN RN Case Freight forwarder for [place] [facility] Mobile: [phone]     Goals Addressed               This Visit's Progress     AG RN Goal: Patient will report decrease in pain of his left knee over the next 90 days (pt-stated)        08/09/2022 Assessment:  Today's Vitals   08/09/22 1332  PainSc: 8     Patient reports severe pain in his left knee that he just lives with. Reports that he occasionally takes a Goody powder.  He works 4 hours per day and wears a sleeve/brace.  Ambulates with a limp.   Interventions: Provided patient with [facility] calendar. Provided my contact card.  Reviewed pain. Conducted brainstorming exercise.  Reviewed benefits of heat and ice, use of tylenol or tylenol arthritis.  Reviewed muscle rubs and OTC medication gel like Voltaren.  Encouraged patient to take tylenol or tylenol arthritis around 2pm before he goes to work at 3.  Encouraged patient to ice knee after work.  Will reassess at next home visit.   Plan: next home visit planned for 09/16/2022  Tomasa Rand RN, BSN, CEN RN Case Manager for [place] Mobile: [phone]        Tomasa Rand RN, BSN, Careers information officer for [employer] Mobile: [phone]

## 2022-08-09 NOTE — Patient Instructions (Signed)
Visit Information   Thank you for taking time to visit with me today. Please don't hesitate to contact me if I can be of assistance to you before our next scheduled home visit.  Following are the goals we discussed today:   Goals Addressed               This Visit's Progress     AG RN Goal: Patient will report decrease in pain of his left knee over the next 90 days (pt-stated)        08/09/2022 Assessment:  Today's Vitals   08/09/22 1332  PainSc: 8     Patient reports severe pain in his left knee that he just lives with. Reports that he occasionally takes a Goody powder.  He works 4 hours per day and wears a sleeve/brace.  Ambulates with a limp.   Interventions: Provided patient with [facility] calendar. Provided my contact card.  Reviewed pain. Conducted brainstorming exercise.  Reviewed benefits of heat and ice, use of tylenol or tylenol arthritis.  Reviewed muscle rubs and OTC medication gel like Voltaren.  Encouraged patient to take tylenol or tylenol arthritis around 2pm before he goes to work at 3.  Encouraged patient to ice knee after work.  Will reassess at next home visit.   Plan: next home visit planned for 09/16/2022  Tomasa Rand RN, BSN, CEN RN Case Manager for [place] [facility] Mobile: [phone]          Our next appointment is 09/16/2022 at 1200 noon   If you are experiencing a [place] or [place] or need someone to talk to, please call the Suicide and Crisis Lifeline: 988 call the [place] National Suicide Prevention Lifeline: 539-238-4622 or TTY: (509) 845-3247 TTY 641-565-8086) to talk to a trained counselor call 1-800-273-TALK (toll free, 24 hour hotline) go to [facility] [place] [phone]) call [place], BSN, [place] RN Case Freight forwarder for [place] Mobile: [phone]

## 2022-08-11 ENCOUNTER — Other Ambulatory Visit: Payer: Self-pay | Admitting: Internal Medicine

## 2022-08-11 DIAGNOSIS — N400 Enlarged prostate without lower urinary tract symptoms: Secondary | ICD-10-CM

## 2022-08-11 NOTE — Telephone Encounter (Signed)
Rx 03/31/22 #90 1RF- 6 month supply Requested Prescriptions  Pending Prescriptions Disp Refills   tamsulosin (FLOMAX) 0.4 MG CAPS capsule 90 capsule 1    Sig: Take 1 capsule (0.4 mg total) by mouth daily.     Urology: Alpha-Adrenergic Blocker Failed - 08/11/2022 12:34 PM      Failed - PSA in normal range and within 360 days    PSA  Date Value Ref Range Status  02/02/2015 4.84 (H) <=4.00 ng/mL Final    Comment:    Test Methodology: ECLIA PSA (Electrochemiluminescence Immunoassay)   For PSA values from 2.5-4.0, particularly in younger men <37 years old, the AUA and NCCN suggest testing for % Free PSA (3515) and evaluation of the rate of increase in PSA (PSA velocity).    Prostate Specific Ag, Serum  Date Value Ref Range Status  01/05/2021 5.7 (H) 0.0 - 4.0 ng/mL Final    Comment:    Roche ECLIA methodology. According to the American Urological Association, Serum PSA should decrease and remain at undetectable levels after radical prostatectomy. The AUA defines biochemical recurrence as an initial PSA value 0.2 ng/mL or greater followed by a subsequent confirmatory PSA value 0.2 ng/mL or greater. Values obtained with different assay methods or kits cannot be used interchangeably. Results cannot be interpreted as absolute evidence of the presence or absence of malignant disease.          Failed - Last BP in normal range    BP Readings from Last 1 Encounters:  07/05/22 (!) 149/84         Passed - Valid encounter within last 12 months    Recent Outpatient Visits           1 month ago Type 2 diabetes mellitus with microalbuminuria, without long-term current use of insulin ([place])   [place], Neoma Laming B, MD   4 months ago Type 2 diabetes mellitus with microalbuminuria, without long-term current use of insulin [facility])   [place], [place], [place]   9 months ago Type 2 diabetes mellitus with  microalbuminuria, without long-term current use of insulin [facility])   [place], MD   9 months ago No-show for appointment   [place], MD   1 year ago Type 2 diabetes mellitus with microalbuminuria, without long-term current use of insulin [facility])   [place], MD       Future Appointments             In 2 months Wynetta Emery Dalbert Batman, MD [place]

## 2022-08-11 NOTE — Telephone Encounter (Signed)
Medication Refill - Medication: tamsulosin (FLOMAX) 0.4 MG CAPS capsule   Has the patient contacted their pharmacy? No.   Preferred Pharmacy (with phone number or street name):  [facility], [place] Phone: [phone]  Fax: [phone]     Has the patient been seen for an appointment in the last year OR does the patient have an upcoming appointment? Yes.     The patient states he only has 2 pills left and needs a refill as soon as soon as possible as he will be out by Saturday. Please assist patient further.

## 2022-08-25 ENCOUNTER — Other Ambulatory Visit: Payer: Self-pay | Admitting: Physician Assistant

## 2022-08-25 DIAGNOSIS — I10 Essential (primary) hypertension: Secondary | ICD-10-CM

## 2022-08-25 NOTE — Telephone Encounter (Signed)
Unable to refill per protocol, request is too soon. Last refill 03/31/22 for 90 and 1 refill. Will refuse.  Requested Prescriptions  Pending Prescriptions Disp Refills   amLODipine (NORVASC) 10 MG tablet [Pharmacy Med Name: AMLODIPINE BESYLATE 10 MG Tablet] 90 tablet 3    Sig: TAKE 1 TABLET EVERY DAY     Cardiovascular: Calcium Channel Blockers 2 Failed - 08/25/2022  4:05 AM      Failed - Last BP in normal range    BP Readings from Last 1 Encounters:  07/05/22 (!) 149/84         Passed - Last Heart Rate in normal range    Pulse Readings from Last 1 Encounters:  07/05/22 89         Passed - Valid encounter within last 6 months    Recent Outpatient Visits           1 month ago Type 2 diabetes mellitus with microalbuminuria, without long-term current use of insulin ([place])   [place], Neoma Laming B, MD   4 months ago Type 2 diabetes mellitus with microalbuminuria, without long-term current use of insulin [facility])   [place], [place], [place]   9 months ago Type 2 diabetes mellitus with microalbuminuria, without long-term current use of insulin [facility])   [place], Deborah B, MD   10 months ago No-show for appointment   [place] Karle Plumber B, MD   1 year ago Type 2 diabetes mellitus with microalbuminuria, without long-term current use of insulin [facility])   [place], MD       Future Appointments             In 2 months Wynetta Emery Dalbert Batman, MD [place]

## 2022-08-31 DIAGNOSIS — E119 Type 2 diabetes mellitus without complications: Secondary | ICD-10-CM | POA: Diagnosis not present

## 2022-08-31 DIAGNOSIS — H2512 Age-related nuclear cataract, left eye: Secondary | ICD-10-CM | POA: Diagnosis not present

## 2022-08-31 DIAGNOSIS — H527 Unspecified disorder of refraction: Secondary | ICD-10-CM | POA: Diagnosis not present

## 2022-08-31 DIAGNOSIS — H31013 Macula scars of posterior pole (postinflammatory) (post-traumatic), bilateral: Secondary | ICD-10-CM | POA: Diagnosis not present

## 2022-08-31 DIAGNOSIS — H35033 Hypertensive retinopathy, bilateral: Secondary | ICD-10-CM | POA: Diagnosis not present

## 2022-08-31 DIAGNOSIS — Z961 Presence of intraocular lens: Secondary | ICD-10-CM | POA: Diagnosis not present

## 2022-08-31 LAB — HM DIABETES EYE EXAM

## 2022-09-02 ENCOUNTER — Encounter: Payer: Self-pay | Admitting: Specialist

## 2022-09-05 ENCOUNTER — Other Ambulatory Visit: Payer: Self-pay | Admitting: Specialist

## 2022-09-05 NOTE — Patient Outreach (Signed)
Aging Gracefully Program  OT Follow-Up Visit  09/05/2022  Logan Knight November 19, 1950 [id number]  Visit:  2- Second Visit  Start Time:  9702 End Time:  1200 Total Minutes:  30  Durable Medical Equipment: Adaptive Equipment: Reacher Adaptive Equipment Distribution Date: 09/05/22  Patient Education: Education Provided: Yes Education Details: how to safely get up from a fall Person(s) Educated: Patient, Spouse Comprehension: Verbalized Understanding  Goals:   Goals Addressed             This Visit's Progress    Patient Stated   On track    Patient will improve safety getting items off of the floor.  Name ___[name]__________________________              Date__________________  OT Action Plan: Generic  Target Problem Area:  Difficulty picking items up off of floor   Why Problem May Occur:     Knee pain  Height Some loss of balance          Target Goal(s):   Patient will safely pick up items off of floor.    STRATEGIES   Saving your Energy DO:  Sit versus stand when possible to retrieve items off of floor.    Use reacher to pick up items off of floor or out of overhead cabinets.               Change your home to make it safe for you    DO:                 Simplifying the way you set up daily activities and routines DO: DON'T:                       PRACTICE  Based on what we have talked about, you are willing to try:  ________________use reacher________________________________________________  ________________________________________________________________  If an idea does not work the first time, try it again.  We may make some changes over the next few sessions, based on how they work.    Hazeline Junker, MHA, OT/L __________________________________________________    _______________________ Occupational Therapist          Date       Patient Stated       Patient will improve safety with getting on and  off commode.         Post Clinical Reasoning: Client Action (Goal) One Interventions: Patient will safely pick up items from the floor Did Client Try?: Yes Targeted Problem Area Status: A Lot Better Clinician View Of Client Situation:: Logan Knight independence with use of reacher and is thankful for this enhancement. [name]: Doing well, is looking forward to bathroom modifications.  He did decide he would like an elevated commode.  - OT to add to his goals. Next Visit Plan:: review goal 1 and problem solve how to improve safety entering and exiting home. Vangie Bicker, [place], OT/L [phone]

## 2022-09-15 DIAGNOSIS — N4232 Atypical small acinar proliferation of prostate: Secondary | ICD-10-CM | POA: Diagnosis not present

## 2022-09-15 DIAGNOSIS — R972 Elevated prostate specific antigen [PSA]: Secondary | ICD-10-CM | POA: Diagnosis not present

## 2022-09-15 DIAGNOSIS — N400 Enlarged prostate without lower urinary tract symptoms: Secondary | ICD-10-CM | POA: Diagnosis not present

## 2022-09-16 ENCOUNTER — Other Ambulatory Visit: Payer: Self-pay

## 2022-09-16 NOTE — Patient Instructions (Signed)
Visit Information  Thank you for taking time to visit with me today. Please don't hesitate to contact me if I can be of assistance to you before our next scheduled telephone appointment.  Following are the goals we discussed today:   Goals Addressed               This Visit's Progress     AG RN Goal: Patient will report decrease in pain of his left knee over the next 90 days (pt-stated)        08/09/2022 Assessment:  Today's Vitals   08/09/22 1332  PainSc: 8     Patient reports severe pain in his left knee that he just lives with. Reports that he occasionally takes a Goody powder.  He works 4 hours per day and wears a sleeve/brace.  Ambulates with a limp.   Interventions: Provided patient with [facility] calendar. Provided my contact card.  Reviewed pain. Conducted brainstorming exercise.  Reviewed benefits of heat and ice, use of tylenol or tylenol arthritis.  Reviewed muscle rubs and OTC medication gel like Voltaren.  Encouraged patient to take tylenol or tylenol arthritis around 2pm before he goes to work at 3.  Encouraged patient to ice knee after work.  Will reassess at next home visit.   Plan: next home visit planned for 09/16/2022  Tomasa Rand RN, BSN, CEN RN Case Manager for [place] [employer] Mobile: [phone]   09/16/2022 Assessment:  ambulatory.  Continues to complain of knee pain. Wears his knee sleeve to work daily.   Interventions: reviewed OTC medications for pain control. Discussed heat and ice.  Provided written packet for home exercises. Demonstrated home exercise plan. Encouraged patient to do exercises daily.  Plan: next home visit is planned for 10/11/2022  Tomasa Rand RN, BSN, CEN RN Case Manager for [place] [employer] Mobile: [phone]          Our next appointment is on 10/11/2022 at 37  If you are experiencing a Mental Health or [place] or need someone to talk to, please call the  Suicide and Crisis Lifeline: 988 call the [place] National Suicide Prevention Lifeline: (863) 003-6385 or TTY: (660)659-8158 TTY 5024623977) to talk to a trained counselor call 1-800-273-TALK (toll free, 24 hour hotline) go to [facility] [place] [phone]) call 911   The patient verbalized understanding of instructions, educational materials, and care plan provided today and agreed to receive a mailed copy of patient instructions, educational materials, and care plan.   Tomasa Rand RN, BSN, Careers information officer for [employer] Mobile: [phone]

## 2022-09-16 NOTE — Patient Outreach (Signed)
Aging Gracefully Program  RN Visit  09/16/2022  AMEIR FARIA 05/30/1951 [id number]  Visit:  RN Visit Number: 2- Second Visit  RN TIME CALCULATION: Start TIme:  RN Start Time Calculation: 1209 End Time:  RN Stop Time Calculation: 1300 Total Minutes:  RN Time Calculation: 51  Readiness To Change Score:     Universal RN Interventions: Calendar Distribution: Yes Exercise Review: Yes Medications: Yes Medication Changes: No Mood: Yes Pain: Yes PCP Advocacy/Support: Yes Fall Prevention: Yes Incontinence: Yes Clinician View Of Client Situation: Well dressed. 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 [name] reports that he is doing well.   left knee pain.  wearing knee sleeve.    saw urologist yesterday for prostate exam.  Healthcare Provider Communication: According to Client, Did PCP Report Communication With An Aging Gracefully RN?: No  Clinician View of Client Situation: Clinician View Of Client Situation: Well dressed. 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 [name] [name] reports that he is doing well.   left knee pain.  wearing knee sleeve.    saw urologist yesterday for prostate exam.  Medication Assessment:denies any changes to medications    OT Update: Gene Owens Shark in to see client today and modifications to start on 09/17/2022  Session Summary: Patient doing well with any additional concerns today.    Goals Addressed               This Visit's Progress     AG RN Goal: Patient will report decrease in pain of his left knee over the next 90 days (pt-stated)        08/09/2022 Assessment:  Today's Vitals   08/09/22 1332  PainSc: 8     Patient reports severe pain in his left knee that he just lives with. Reports that he occasionally takes a Goody powder.  He works 4 hours per day and wears a sleeve/brace.  Ambulates with a limp.    Interventions: Provided patient with [facility] calendar. Provided my contact card.  Reviewed pain. Conducted brainstorming exercise.  Reviewed benefits of heat and ice, use of tylenol or tylenol arthritis.  Reviewed muscle rubs and OTC medication gel like Voltaren.  Encouraged patient to take tylenol or tylenol arthritis around 2pm before he goes to work at 3.  Encouraged patient to ice knee after work.  Will reassess at next home visit.   Plan: next home visit planned for 09/16/2022  Tomasa Rand RN, BSN, CEN RN Case Manager for [place] [employer] Mobile: [phone]   09/16/2022 Assessment:  ambulatory.  Continues to complain of knee pain. Wears his knee sleeve to work daily.   Interventions: reviewed OTC medications for pain control. Discussed heat and ice.  Provided written packet for home exercises. Demonstrated home exercise plan. Encouraged patient to do exercises daily.  Plan: next home visit is planned for 10/11/2022  Tomasa Rand RN, BSN, CEN RN Case Manager for [place] Mobile: [phone]       Tomasa Rand RN, BSN, Careers information officer for [employer] Mobile: [phone]

## 2022-09-27 ENCOUNTER — Encounter: Payer: Self-pay | Admitting: Internal Medicine

## 2022-09-29 ENCOUNTER — Telehealth: Payer: Self-pay | Admitting: Emergency Medicine

## 2022-09-29 DIAGNOSIS — M175 Other unilateral secondary osteoarthritis of knee: Secondary | ICD-10-CM

## 2022-09-29 NOTE — Telephone Encounter (Signed)
Copied from [place] [id number]. Topic: Referral - Status >> Sep 29, 2022  5:04 PM Cyndi Bender wrote: Reason for CRM: Pt requests a referral to a specialist for left knee pain. Pt stated it has gotten to the point where he can barely walk. Cb# [phone]

## 2022-09-29 NOTE — Addendum Note (Signed)
Addended by: Karle Plumber B on: 09/29/2022 06:09 PM   Modules accepted: Orders

## 2022-09-30 NOTE — Telephone Encounter (Signed)
Called & spoke to the patient. Verified name & DOB. Informed that referral has been sent to an Orthopedic specialist. Patient confirmed that he has already been called & an appointment has been scheduled.

## 2022-10-03 ENCOUNTER — Encounter: Payer: Self-pay | Admitting: Specialist

## 2022-10-11 ENCOUNTER — Other Ambulatory Visit: Payer: Self-pay

## 2022-10-11 ENCOUNTER — Other Ambulatory Visit: Payer: Self-pay | Admitting: Specialist

## 2022-10-11 NOTE — Patient Outreach (Signed)
Aging Gracefully Program  OT Follow-Up Visit  10/11/2022  NAJEH BEEL 03-Nov-1950 [id number]  Visit:  3- Third Visit  Start Time:  [id number] End Time:  1110 Total Minutes:  20   Durable Medical Equipment: Durable Medical Equipment: Shower Chair With Back Durable Medical Equipment Distribution Date: 10/11/22  Patient Education: Education Provided: Yes Education Details: use of shower seat with back Person(s) Educated: Patient, Spouse Comprehension: Verbalized Understanding  Goals:   Goals Addressed             This Visit's Progress    Patient Stated   On track    Patient will improve safety and independence getting in and out of shower.      Patient Stated   On track    Patient will improve safety with getting on and off commode.   Name ________[name]_____________             Date_____2/13/24_____________  OT ACTION PLAN: Functional Mobility  Target Problem Area:  Decreased independence and increased pain with transfers on and off of commode.    Why Problem May Occur:     Commode is low  No grab bars near commode  Knee arthritis        Target Goal(s):   Patient will safely and independently transfer on and off of commode with less pain.    STRATEGIES   Saving Your Energy DO:  Take breaks  X Raise the height of surfaces   Remove tripping hazards  (Other): install comfort level commode   (Other): use sink vanity as support during transfers.    (Other):    Modifying your home environment and making it safe    DO:  X Install grab bars in the bathroom  Remove or strongly secure throw rugs  (Other):   (Other):   (Other):    Simplifying the way you set up tasks or daily routines DO:  Move slowly   (Other):   (Other):   (Other):   (Other):     PRACTICE  Based on what we have talked about, you are willing to try:  ______use comfort level commode during transfers.  ____________________  ________________________________________________________________  If a strategy does not work the first time, try it again (and again).  We may make some changes over the next few sessions, based on how they work.   Hazeline Junker, MHA, OT/L        10/11/22 ________________________________________________   __________ Occupational Therapist          Date            Post Clinical Reasoning: Client Action (Goal) Two Interventions: Patient will improve safety getting up and down from commode - removed low commode and replaced with a comfort level commode.  Instructed patient to use bathroom sink vanity for support as he transfers on and off of commode. Targeted Problem Area Status: A Lot Better Clinician View Of Client Situation:: Mr. Beeck is extremely pleased with the commode.  He states that it is much easier and less painful on his knee to transfer on and off of the commode. [name]: Doing well.  Has an appointment in 2 week with an orthopedic MD about his knee, as it continues to be painful and cause him more pain.  The commode height does make his transfers less painful. Next session:  work on goal 3:  safely entering and exiting home, review "Tips and Tricks for Aging In Place" booklet. Vangie Bicker,  [place], [place]

## 2022-10-11 NOTE — Patient Instructions (Signed)
Visit Information  Thank you for taking time to visit with me today. Please don't hesitate to contact me if I can be of assistance to you before our next scheduled telephone appointment.  Following are the goals we discussed today:    Goals Addressed               This Visit's Progress     AG RN Goal: Patient will report decrease in pain of his left knee over the next 90 days (pt-stated)        08/09/2022 Assessment:  Today's Vitals   08/09/22 1332  PainSc: 8     Patient reports severe pain in his left knee that he just lives with. Reports that he occasionally takes a Goody powder.  He works 4 hours per day and wears a sleeve/brace.  Ambulates with a limp.   Interventions: Provided patient with [facility] calendar. Provided my contact card.  Reviewed pain. Conducted brainstorming exercise.  Reviewed benefits of heat and ice, use of tylenol or tylenol arthritis.  Reviewed muscle rubs and OTC medication gel like Voltaren.  Encouraged patient to take tylenol or tylenol arthritis around 2pm before he goes to work at 3.  Encouraged patient to ice knee after work.  Will reassess at next home visit.   Plan: next home visit planned for 09/16/2022  Tomasa Rand RN, BSN, CEN RN Case Manager for [place] [employer] Mobile: [phone]   09/16/2022 Assessment:  ambulatory.  Continues to complain of knee pain. Wears his knee sleeve to work daily.   Interventions: reviewed OTC medications for pain control. Discussed heat and ice.  Provided written packet for home exercises. Demonstrated home exercise plan. Encouraged patient to do exercises daily.  Plan: next home visit is planned for 10/11/2022  Tomasa Rand RN, BSN, CEN RN Case Manager for [place] Mobile: [phone]   10/11/2022 Assessment:   Today's Vitals   10/11/22 1421  PainSc: 8    Patient continues to have significant pain in his knees.  Reports no worse but no better. Has  an ortho appointment schedule for evaluation.  Patient continues to work and do his home exercises.  Interventions: Encouraged patient to continue to do home exercise plan daily. Encouraged patient to explore option for pain relief with MD.   Plan: next home visit planned for 11/08/2022.  Tomasa Rand RN, BSN, CEN RN Case Freight forwarder for [place] [employer] Mobile: [phone]           Our next appointment is on 0312/2024 at 1230  If you are experiencing a [place] or [place] or need someone to talk to, please call the Suicide and Crisis Lifeline: 988 call the [place] National Suicide Prevention Lifeline: 807-749-4632 or TTY: 580-803-4728 TTY 470-824-0865) to talk to a trained counselor call 1-800-273-TALK (toll free, 24 hour hotline) go to [facility] [place] [phone]) call 911   The patient verbalized understanding of instructions, educational materials, and care plan provided today and agreed to receive a mailed copy of patient instructions, educational materials, and care plan.   Tomasa Rand RN, BSN, Careers information officer for [employer] Mobile: [phone]

## 2022-10-11 NOTE — Patient Outreach (Signed)
Aging Gracefully Program  RN Visit  10/11/2022  Logan Knight [birth year] [id number]  Visit:  RN Visit Number: 3- Third Visit  RN TIME CALCULATION: Start TIme:  RN Start Time Calculation: 1300 End Time:  RN Stop Time Calculation: 1330 Total Minutes:  RN Time Calculation: 30  Readiness To Change Score:     Universal RN Interventions: Calendar Distribution: Yes Exercise Review: Yes Medications: Yes Medication Changes: No Mood: Yes Pain: Yes PCP Advocacy/Support: No Incontinence: Yes Clinician View Of Client Situation: Well dressed. ambulatory without any assistive devices.  Reviewed new handrails and walkin shower.  Patient likes them alot. [name] Reports that he continues to significant knee pain. States he has an appointment with ortho.  Reports that he continues to use voltaren gel and his knee sleeve.  Reports that he is not able to run. Reports that he continues to limp.   States that he has been doing his home exercises.  Denies any falls and denies any new problems or concerns.  Pain level today 8/10. Denies any changes to medications.  Healthcare Provider Communication:  none    Clinician View of Client Situation: Clinician View Of Client Situation: Well dressed. ambulatory without any assistive devices.  Reviewed new handrails and walkin shower.  Patient likes them alot. [name] [name] Reports that he continues to significant knee pain. States he has an appointment with ortho.  Reports that he continues to use voltaren gel and his knee sleeve.  Reports that he is not able to run. Reports that he continues to limp.   States that he has been doing his home exercises.  Denies any falls and denies any new problems or concerns.  Pain level today 8/10. Denies any changes to medications.  Medication Assessment: denies any  changes to medications    OT Update: had home visit with OT today.   Session Summary: Doing well. Patient likes his upgrades to his home.    Goals Addressed               This Visit's Progress     AG RN Goal: Patient will report decrease in pain of his left knee over the next 90 days (pt-stated)        08/09/2022 Assessment:  Today's Vitals   08/09/22 1332  PainSc: 8     Patient reports severe pain in his left knee that he just lives with. Reports that he occasionally takes a Goody powder.  He works 4 hours per day and wears a sleeve/brace.  Ambulates with a limp.   Interventions: Provided patient with [facility] calendar. Provided my contact card.  Reviewed pain. Conducted brainstorming exercise.  Reviewed benefits of heat and ice, use of tylenol or tylenol arthritis.  Reviewed muscle rubs and OTC medication gel like Voltaren.  Encouraged patient to take tylenol or tylenol arthritis around 2pm before he goes to work at 3.  Encouraged patient to ice knee after work.  Will reassess at next home visit.   Plan: next home visit planned for 09/16/2022  Tomasa Rand RN, BSN, CEN RN Case Manager for [place] [employer] Mobile: [phone]   09/16/2022 Assessment:  ambulatory.  Continues to complain of knee pain. Wears his knee sleeve to work daily.   Interventions: reviewed OTC medications for pain control. Discussed heat and ice.  Provided written packet for home  exercises. Demonstrated home exercise plan. Encouraged patient to do exercises daily.  Plan: next home visit is planned for 10/11/2022  Tomasa Rand RN, BSN, CEN RN Case Manager for [place] Mobile: [phone]   10/11/2022 Assessment:   Today's Vitals   10/11/22 1421  PainSc: 8    Patient continues to have significant pain in his knees.  Reports no worse but no better. Has an ortho appointment schedule for evaluation.  Patient continues to work and do his home  exercises.  Interventions: Encouraged patient to continue to do home exercise plan daily. Encouraged patient to explore option for pain relief with MD.   Plan: next home visit planned for 11/08/2022.  Tomasa Rand RN, BSN, Careers information officer for [employer] Mobile: [phone]        Tomasa Rand RN, BSN, Careers information officer for [employer] Mobile: [phone]

## 2022-10-14 ENCOUNTER — Telehealth (HOSPITAL_COMMUNITY): Payer: Self-pay | Admitting: Specialist

## 2022-10-24 ENCOUNTER — Ambulatory Visit (INDEPENDENT_AMBULATORY_CARE_PROVIDER_SITE_OTHER): Payer: Medicare HMO

## 2022-10-24 ENCOUNTER — Ambulatory Visit (INDEPENDENT_AMBULATORY_CARE_PROVIDER_SITE_OTHER): Payer: Medicare HMO | Admitting: Orthopaedic Surgery

## 2022-10-24 VITALS — Ht 73.0 in | Wt 206.0 lb

## 2022-10-24 DIAGNOSIS — M25562 Pain in left knee: Secondary | ICD-10-CM | POA: Diagnosis not present

## 2022-10-24 DIAGNOSIS — M1712 Unilateral primary osteoarthritis, left knee: Secondary | ICD-10-CM

## 2022-10-24 DIAGNOSIS — G8929 Other chronic pain: Secondary | ICD-10-CM

## 2022-10-24 MED ORDER — LIDOCAINE HCL 1 % IJ SOLN
3.0000 mL | INTRAMUSCULAR | Status: AC | PRN
  Administered 2022-10-24: 3 mL

## 2022-10-24 MED ORDER — METHYLPREDNISOLONE ACETATE 40 MG/ML IJ SUSP
40.0000 mg | INTRAMUSCULAR | Status: AC | PRN
  Administered 2022-10-24: 40 mg via INTRA_ARTICULAR

## 2022-10-24 NOTE — Progress Notes (Signed)
The patient is somewhat I am seeing for the first time.  He has been a long-term patient of Dr. Durward Fortes.  He comes in today for evaluation treatment of chronic left knee pain.  He states that he had a steroid injection in that knee years ago but he does not really remember if it worked or not.  He is a thin individual.  He is 72 years old and very active.  He is a diabetic but has a hemoglobin A1c of around 6.5.  His knee does hurt on a daily basis and has been getting slowly worse.  This is been going on for many years now.  He denies any injury and has never had surgery on that knee.  On examination he has no effusion of his left knee but there is significant varus malalignment that is correctable.  Most of his tenderness is at the medial compartment of the knee.  There are some patellofemoral crepitation as well.  2 views left knee show tricompartment arthritis with slight varus malalignment but it is not bone-on-bone yet on the medial aspect of the knee.  I showed him a knee model and went over his x-rays.  He still works at [employer] and is on his feet quite a bit of time.  He would like to still stay conservative so I recommended a steroid injection in his knee today.  I described the risk and benefits of steroid injections and also gave him a handout on hyaluronic acid injections.  Being a diabetic he knows that this can bump his blood glucose he will watch his carbohydrate intake.  He did tolerate steroid injection well in his left knee today.  I would like to see him back in 4 weeks to see how he is doing overall and to see whether or not he would eventually be a candidate for hyaluronic acid.  He agrees with this treatment plan.     Procedure Note  Patient: Logan Knight             Date of Birth: [birth year]           MRN: [id number]             Visit Date: 10/24/2022  Procedures: Visit Diagnoses:  1. Chronic pain of left knee   2. Unilateral primary osteoarthritis, left knee     Large  Joint Inj: L knee on 10/24/2022 9:45 AM Indications: diagnostic evaluation and pain Details: 22 G 1.5 in needle, superolateral approach  Arthrogram: No  Medications: 3 mL lidocaine 1 %; 40 mg methylPREDNISolone acetate 40 MG/ML Outcome: tolerated well, no immediate complications Procedure, treatment alternatives, risks and benefits explained, specific risks discussed. Consent was given by the patient. Immediately prior to procedure a time out was called to verify the correct patient, procedure, equipment, support staff and site/side marked as required. Patient was prepped and draped in the usual sterile fashion.

## 2022-10-26 ENCOUNTER — Other Ambulatory Visit: Payer: Self-pay | Admitting: Specialist

## 2022-10-26 NOTE — Patient Outreach (Signed)
Aging [facility]fully Program  OT FINAL Visit  10/26/2022  Logan Knight 12/25/1950 [id number]  Visit:  4- Fourth Visit  Start Time:  [id number] End Time:  1200 Total Minutes:  20  Readiness to Change:  Readiness to Change Score: 10  Home Environment Assessment:    Durable Medical Equipment:    Patient Education: Education Provided: Yes Education Details: Tips for Aging In Conservation officer, historic buildings) Educated: Patient, Spouse Comprehension: Verbalized Understanding  Goals:  Goals Addressed             This Visit's Progress    COMPLETED: Patient Stated       Patient will improve safety and independence getting in and out of shower.      COMPLETED: Patient Stated       Patient will improve safety getting items off of the floor.  Name ___[name]__________________________              Date__________________  OT Action Plan: Generic  Target Problem Area:  Difficulty picking items up off of floor   Why Problem May Occur:     Knee pain  Height Some loss of balance          Target Goal(s):   Patient will safely pick up items off of floor.    STRATEGIES   Saving your Energy DO:  Sit versus stand when possible to retrieve items off of floor.    Use reacher to pick up items off of floor or out of overhead cabinets.               Change your home to make it safe for you    DO:                 Simplifying the way you set up daily activities and routines DO: DON'T:                       PRACTICE  Based on what we have talked about, you are willing to try:  ________________use reacher________________________________________________  ________________________________________________________________  If an idea does not work the first time, try it again.  We may make some changes over the next few sessions, based on how they work.    Hazeline Junker, MHA,  OT/L __________________________________________________    _______________________ Occupational Therapist          Date       Patient Stated       Patient will improve safety entering and exiting home.  Name _________________________________             Date__________________  OT ACTION PLAN: Functional Mobility  Target Problem Area:   Difficulty entering and exiting home.    Why Problem May Occur:     Significant knee arthritis Lack railings on front and back porch.            Target Goal(s):    Safely enter and exit home.    STRATEGIES   Saving Your Energy DO:  Take breaks  Raise the height of surfaces   Remove tripping hazards  (Other):   (Other):   (Other):    Modifying your home environment and making it safe    DO:  Install grab bars in the bathroom  Remove or strongly secure throw rugs  (Other):  Use handrails to enter and exit home.    (Other):   (Other):    Simplifying the way you set up tasks or daily routines DO:  Move slowly   (  Other):   (Other):   (Other):   (Other):     PRACTICE  Based on what we have talked about, you are willing to try:  ____Use handrails when entering and exiting home. ___________________  ________________________________________________________________  If a strategy does not work the first time, try it again (and again).  We may make some changes over the next few sessions, based on how they work.   Vangie Bicker, [place], OT/L [phone]  ________________________________________________   __________ Occupational Therapist          Date         COMPLETED: Patient Stated       Patient will improve safety with getting on and off commode.   Name ________[name]_____________             Date_____2/13/24_____________  OT ACTION PLAN: Functional Mobility  Target Problem Area:  Decreased independence and increased pain with transfers on and off of commode.    Why Problem May  Occur:     Commode is low  No grab bars near commode  Knee arthritis        Target Goal(s):   Patient will safely and independently transfer on and off of commode with less pain.    STRATEGIES   Saving Your Energy DO:  Take breaks  X Raise the height of surfaces   Remove tripping hazards  (Other): install comfort level commode   (Other): use sink vanity as support during transfers.    (Other):    Modifying your home environment and making it safe    DO:  X Install grab bars in the bathroom  Remove or strongly secure throw rugs  (Other):   (Other):   (Other):    Simplifying the way you set up tasks or daily routines DO:  Move slowly   (Other):   (Other):   (Other):   (Other):     PRACTICE  Based on what we have talked about, you are willing to try:  ______use comfort level commode during transfers. ____________________  ________________________________________________________________  If a strategy does not work the first time, try it again (and again).  We may make some changes over the next few sessions, based on how they work.   Hazeline Junker, MHA, OT/L        10/11/22 ________________________________________________   __________ Occupational Therapist          Date            Post Clinical Reasoning: Client Action (Goal) Three Interventions: Paitent will improve safety and independence entering and exiting home. Clinician View Of Client Situation:: Logan Knight is doing well.  He received a cortisone injection in his knee and can walk without pain for the first time in years.  He is very please with all home modfications. [name]: Pain free for the first time in years!  His only complaint is he can't use the bathroom because his wife is always in the bathoorm admiring the new shower!  He feels he has met all of his OT goals. Next Visit Plan:: DC from OT visits with Aging Gracefuly.  All goals met. Vangie Bicker, [place], OT/L [phone]

## 2022-11-02 ENCOUNTER — Other Ambulatory Visit: Payer: Self-pay | Admitting: Internal Medicine

## 2022-11-02 DIAGNOSIS — E1159 Type 2 diabetes mellitus with other circulatory complications: Secondary | ICD-10-CM

## 2022-11-02 DIAGNOSIS — I152 Hypertension secondary to endocrine disorders: Secondary | ICD-10-CM

## 2022-11-02 NOTE — Telephone Encounter (Signed)
Requested medication (s) are due for refill today: yes  Requested medication (s) are on the active medication list: yes  Last refill:  07/05/22 #90/0  Future visit scheduled: yes 2 days   Notes to clinic:  Unable to refill per protocol due to failed labs, no updated results.    Requested Prescriptions  Pending Prescriptions Disp Refills   hydrochlorothiazide (HYDRODIURIL) 25 MG tablet [Pharmacy Med Name: HYDROCHLOROTHIAZIDE 25 MG Tablet] 90 tablet 3    Sig: TAKE 1 TABLET EVERY DAY     Cardiovascular: Diuretics - Thiazide Failed - 11/02/2022  2:32 AM      Failed - Cr in normal range and within 180 days    Creat  Date Value Ref Range Status  06/09/2016 1.29 (H) 0.70 - 1.25 mg/dL Final    Comment:      For patients > or = 72 years of age: The upper reference limit for Creatinine is approximately 13% higher for people identified as African-American.      Creatinine, Ser  Date Value Ref Range Status  03/31/2022 1.20 0.76 - 1.27 mg/dL Final         Failed - K in normal range and within 180 days    Potassium  Date Value Ref Range Status  03/31/2022 3.8 3.5 - 5.2 mmol/L Final         Failed - Na in normal range and within 180 days    Sodium  Date Value Ref Range Status  03/31/2022 140 134 - 144 mmol/L Final         Failed - Last BP in normal range    BP Readings from Last 1 Encounters:  07/05/22 (!) 149/84         Passed - Valid encounter within last 6 months    Recent Outpatient Visits           4 months ago Type 2 diabetes mellitus with microalbuminuria, without long-term current use of insulin ([place])   [place] Karle Plumber B, MD   7 months ago Type 2 diabetes mellitus with microalbuminuria, without long-term current use of insulin [facility])   [place], [place], [place]   12 months ago Type 2 diabetes mellitus with microalbuminuria, without long-term current use of insulin [facility])    [place] Ladell Pier, MD   1 year ago No-show for appointment   [place] Karle Plumber B, MD   1 year ago Type 2 diabetes mellitus with microalbuminuria, without long-term current use of insulin [facility])   [place], Deborah B, MD       Future Appointments             In 2 days Ladell Pier, MD [place]   In 2 months Ninfa Linden, Lind Guest, MD [place]

## 2022-11-04 ENCOUNTER — Encounter: Payer: Self-pay | Admitting: Internal Medicine

## 2022-11-04 ENCOUNTER — Ambulatory Visit: Payer: Medicare HMO | Attending: Internal Medicine | Admitting: Internal Medicine

## 2022-11-04 VITALS — BP 113/68 | HR 79 | Temp 98.6°F | Ht 73.0 in | Wt 209.0 lb

## 2022-11-04 DIAGNOSIS — E1129 Type 2 diabetes mellitus with other diabetic kidney complication: Secondary | ICD-10-CM

## 2022-11-04 DIAGNOSIS — J069 Acute upper respiratory infection, unspecified: Secondary | ICD-10-CM | POA: Diagnosis not present

## 2022-11-04 DIAGNOSIS — R350 Frequency of micturition: Secondary | ICD-10-CM

## 2022-11-04 DIAGNOSIS — R809 Proteinuria, unspecified: Secondary | ICD-10-CM

## 2022-11-04 DIAGNOSIS — R519 Headache, unspecified: Secondary | ICD-10-CM | POA: Diagnosis not present

## 2022-11-04 DIAGNOSIS — R3589 Other polyuria: Secondary | ICD-10-CM

## 2022-11-04 LAB — POCT GLYCOSYLATED HEMOGLOBIN (HGB A1C): HbA1c, POC (controlled diabetic range): 6.5 % (ref 0.0–7.0)

## 2022-11-04 LAB — POCT URINALYSIS DIP (CLINITEK)
Glucose, UA: 100 mg/dL — AB
Ketones, POC UA: NEGATIVE mg/dL
Leukocytes, UA: NEGATIVE
Nitrite, UA: NEGATIVE
POC PROTEIN,UA: >=300 — AB
Spec Grav, UA: 1.025 (ref 1.010–1.025)
Urobilinogen, UA: >=8 E.U./dL — AB
pH, UA: 7 (ref 5.0–8.0)

## 2022-11-04 LAB — POCT INFLUENZA A/B
Influenza A, POC: NEGATIVE
Influenza B, POC: NEGATIVE

## 2022-11-04 LAB — GLUCOSE, POCT (MANUAL RESULT ENTRY): POC Glucose: 191 mg/dl — AB (ref 70–99)
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
## 2022-11-04 NOTE — Patient Instructions (Addendum)
I think overall your symptoms are likely due to an acute viral illness.  However given the severity of your symptoms especially the shortness of breath, fatigue and severe headache, I recommend being seen in the emergency room.  Rapid test for flu was negative.  Await results of COVID test.

## 2022-11-05 LAB — NOVEL CORONAVIRUS, NAA: SARS-CoV-2, NAA: NOT DETECTED

## 2022-11-06 ENCOUNTER — Telehealth: Payer: Self-pay | Admitting: Internal Medicine

## 2022-11-06 NOTE — Telephone Encounter (Signed)
PC placed to pt this afternoon.  Left VMM on home phone and cell informing of who I am and that I was following up to see how he is doing and to give lab results.  We will reach out to him again tomorrow.

## 2022-11-07 NOTE — Telephone Encounter (Signed)
Noted  

## 2022-11-07 NOTE — Telephone Encounter (Signed)
Pt called back. Shared negative test result. Pt states he did not go to ED.   He is feeling much better. His HA, and BA have resolved. His appetite has returned. He has taken Tylenol and Nightquil for s/s.

## 2022-11-07 NOTE — Telephone Encounter (Signed)
Call placed to inform Patient informed of negative COVID test and if he went to ED as advised .  unable to reach message left on VM. No encounters note  in chart to indicate patient went to ED

## 2022-11-08 ENCOUNTER — Other Ambulatory Visit: Payer: Self-pay

## 2022-11-08 ENCOUNTER — Telehealth: Payer: Self-pay | Admitting: Internal Medicine

## 2022-11-08 DIAGNOSIS — R3129 Other microscopic hematuria: Secondary | ICD-10-CM

## 2022-11-08 NOTE — Patient Instructions (Signed)
Visit Information  Thank you for taking time to visit with me today. Please don't hesitate to contact me if I can be of assistance to you before our next scheduled telephone appointment.  Following are the goals we discussed today:   Goals Addressed               This Visit's Progress     COMPLETED: AG RN Goal: Patient will report decrease in pain of his left knee over the next 90 days (pt-stated)        08/09/2022 Assessment:  Today's Vitals   08/09/22 1332  PainSc: 8     Patient reports severe pain in his left knee that he just lives with. Reports that he occasionally takes a Goody powder.  He works 4 hours per day and wears a sleeve/brace.  Ambulates with a limp.   Interventions: Provided patient with [facility] calendar. Provided my contact card.  Reviewed pain. Conducted brainstorming exercise.  Reviewed benefits of heat and ice, use of tylenol or tylenol arthritis.  Reviewed muscle rubs and OTC medication gel like Voltaren.  Encouraged patient to take tylenol or tylenol arthritis around 2pm before he goes to work at 3.  Encouraged patient to ice knee after work.  Will reassess at next home visit.   Plan: next home visit planned for 09/16/2022  Tomasa Rand RN, BSN, CEN RN Case Manager for [employer] Mobile: [phone]   09/16/2022 Assessment:  ambulatory.  Continues to complain of knee pain. Wears his knee sleeve to work daily.   Interventions: reviewed OTC medications for pain control. Discussed heat and ice.  Provided written packet for home exercises. Demonstrated home exercise plan. Encouraged patient to do exercises daily.  Plan: next home visit is planned for 10/11/2022  Tomasa Rand RN, BSN, CEN RN Case Manager for [place] [employer]: [phone]   10/11/2022 Assessment:   Today's Vitals   10/11/22 1421  PainSc: 8    Patient continues to have significant pain in his knees.  Reports no worse but no  better. Has an ortho appointment schedule for evaluation.  Patient continues to work and do his home exercises.  Interventions: Encouraged patient to continue to do home exercise plan daily. Encouraged patient to explore option for pain relief with MD.   Plan: next home visit planned for 11/08/2022.  Tomasa Rand RN, BSN, CEN RN Case Freight forwarder for [place] [employer]: [phone]  11/08/2022 Assessment: Patient reports that he saw ortho and got an injections. Reports no pain today and he does not remember the last time he was pain free.  Continues to be active and continues to work.  Report recent illness and dark urine.   Interventions: reviewed office visit from ortho and PCP with patient.  Patient has follow up plans.  Reviewed benefits of osteo-biflex medications for joints.  Message sent to MD about urine result and continues to have dark urine. 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   Plan: encouraged patient to keep his follow up appointments.  Reviewed referral being placed by PCP after today's message.  Reviewed with patient that he has completed all home visit.  Patient is interested in a nurse case manager to help him with future health management. Referral placed.  Encouraged patient  to continue to be active.   Tomasa Rand RN, BSN, Careers information officer for [employer] Mobile: [phone]  If you are experiencing a Mental Health or [place] or need someone to talk to, please call the Suicide and Crisis Lifeline: 988 call the [place] National Suicide Prevention Lifeline: 206-733-1426 or TTY: 616 559 1394 TTY 661-801-9617) to talk to a trained counselor call 1-800-273-TALK (toll free, 24 hour hotline) go to [facility] [address] [place] [phone]) call 911   The patient verbalized understanding of  instructions, educational materials, and care plan provided today and agreed to receive a mailed copy of patient instructions, educational materials, and care plan.   Tomasa Rand RN, BSN, Careers information officer for [employer] Mobile: [phone]

## 2022-11-08 NOTE — Patient Outreach (Signed)
Aging Gracefully Program  RN Visit  11/08/2022  Logan Knight [birth year] [id number]  Visit:  RN Visit Number: 4- Fourth Visit  RN TIME CALCULATION: Start TIme:  RN Start Time Calculation: [id number] End Time:  RN Stop Time Calculation: 1400 Total Minutes:  RN Time Calculation: 90  Readiness To Change Score:     Universal RN Interventions: Calendar Distribution: Yes Exercise Review: Yes Medications: Yes Mood: Yes Pain: Yes PCP Advocacy/Support: Yes Fall Prevention: Yes Incontinence: Yes Clinician View Of Client Situation: Patient well dressed in a good mood.  Home neat and clean. Wife present for home visit. [name] Patient reports that he had a very good visit with orthopedist. Reports that he got an injection and that his knees are feeling better. Reports that he discussed gel injections as well. Patient has follow up planned.  Reviewed with patient that last week he had chills. headache and felt pootly. Reports that he went to see his PCP.   Reports as of today emerguy level is low.  Healthcare Provider Communication: Did Higher education careers adviser With [employer] Provider?: Yes Method Of Communication: Other: (secured chat) Healthcare Provider Response According to RN: MD will send referral to urology Dr. Gloriann Loan  Clinician View of Client Situation: Clinician View Of Client Situation: Patient well dressed in a good mood.  Home neat and clean. Wife present for home visit. [name] [name] Patient reports that he had a very good visit with orthopedist. Reports that he got an injection and that his knees are feeling better. Reports that he discussed gel injections as well. Patient has follow up planned.  Reviewed with patient that last week he had chills. headache and felt pootly. Reports that he went to see his PCP.   Reports as of today emerguy level is low.  Medication Assessment: denies any changes to  medications    OT Update: OT visits completed  Session Summary: Patient doing very well. Message to MD about blood in urine.   Goals Addressed               This Visit's Progress     COMPLETED: AG RN Goal: Patient will report decrease in pain of his left knee over the next 90 days (pt-stated)        08/09/2022 Assessment:  Today's Vitals   08/09/22 1332  PainSc: 8     Patient reports severe pain in his left knee that he just lives with. Reports that he occasionally takes a Goody powder.  He works 4 hours per day and wears a sleeve/brace.  Ambulates with a limp.   Interventions: Provided patient with [facility] calendar. Provided my contact card.  Reviewed pain. Conducted brainstorming exercise.  Reviewed benefits of heat and ice, use of tylenol or tylenol arthritis.  Reviewed muscle rubs and OTC medication gel like Voltaren.  Encouraged patient to take tylenol or tylenol arthritis around 2pm before he goes to work at 3.  Encouraged patient to ice knee after work.  Will reassess at next home visit.   Plan: next home visit planned for 09/16/2022  Tomasa Rand RN, BSN, CEN RN Case Manager for [employer] Mobile: [phone]   09/16/2022 Assessment:  ambulatory.  Continues to complain of knee pain. Wears his knee sleeve to work daily.   Interventions: reviewed OTC medications for pain control. Discussed heat and ice.  Provided written packet for home exercises. Demonstrated home exercise plan. Encouraged patient to  do exercises daily.  Plan: next home visit is planned for 10/11/2022  Tomasa Rand RN, BSN, CEN RN Case Manager for [place] [employer]: [phone]   10/11/2022 Assessment:   Today's Vitals   10/11/22 1421  PainSc: 8    Patient continues to have significant pain in his knees.  Reports no worse but no better. Has an ortho appointment schedule for evaluation.  Patient continues to work and do his home  exercises.  Interventions: Encouraged patient to continue to do home exercise plan daily. Encouraged patient to explore option for pain relief with MD.   Plan: next home visit planned for 11/08/2022.  Tomasa Rand RN, BSN, CEN RN Case Freight forwarder for [place] Mobile: [phone]  11/08/2022 Assessment: Patient reports that he saw ortho and got an injections. Reports no pain today and he does not remember the last time he was pain free.  Continues to be active and continues to work.  Report recent illness and dark urine.   Interventions: reviewed office visit from ortho and PCP with patient.  Patient has follow up plans.  Reviewed benefits of osteo-biflex medications for joints.  Message sent to MD about urine result and continues to have dark urine. 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   Plan: encouraged patient to keep his follow up appointments.  Reviewed referral being placed by PCP after today's message.  Reviewed with patient that he has completed all home visit.  Patient is interested in a nurse case manager to help him with future health management. Referral placed.  Encouraged patient  to continue to be active.   Tomasa Rand RN, BSN, Careers information officer for [employer] Mobile: [phone]          Tomasa Rand RN, BSN, Careers information officer for [employer] Mobile: [phone]

## 2022-11-08 NOTE — Telephone Encounter (Signed)
Copied from [place] [id number]. Topic: General - Other >> Nov 08, 2022  4:02 PM Chapman Fitch wrote: Reason for CRM: Pts home nurse was concerned about his Urine sample and the results/ pt called to see if he needs to be concerned and wanted to go over his results with Dr. Wynetta Emery / please advise

## 2022-11-09 NOTE — Addendum Note (Signed)
Addended by: Karle Plumber B on: 11/09/2022 08:37 AM   Modules accepted: Orders

## 2022-11-09 NOTE — Telephone Encounter (Signed)
I received a Secure Chat from [place] yesterday informing me that she was at the patient's home seeing him for Aging Gracefully.  Patient told her that his urine has been darker than normal.  She saw that I had seen him on 11/04/2022 and wondered whether he had a urinary tract infection based on urinalysis that I did.  She notes that he had a small amount of blood on the urinalysis.  Wanted to know whether patient needs to follow-up with his urologist Dr. Gloriann Loan.  I informed her that based on the urinalysis, he did not have a urinary tract infection at the time.  However I will submit a referral for him to follow-up with his urologist.

## 2022-11-11 NOTE — Telephone Encounter (Signed)
Pt called and asked if he can get a call from Dr. Wynetta Emery / pt wanted to confirm there was no issue with his urine and he was advised that Dr. Wynetta Emery would submit a referral to urologist / pt did receive a call from Urology and was wondering why / please advise

## 2022-11-11 NOTE — Telephone Encounter (Signed)
Patient was called and informed the need for the urology referral.

## 2022-11-22 DIAGNOSIS — N401 Enlarged prostate with lower urinary tract symptoms: Secondary | ICD-10-CM | POA: Diagnosis not present

## 2022-11-22 DIAGNOSIS — R350 Frequency of micturition: Secondary | ICD-10-CM | POA: Diagnosis not present

## 2022-11-22 DIAGNOSIS — R351 Nocturia: Secondary | ICD-10-CM | POA: Diagnosis not present

## 2022-11-22 DIAGNOSIS — N410 Acute prostatitis: Secondary | ICD-10-CM | POA: Diagnosis not present

## 2022-11-27 ENCOUNTER — Emergency Department (HOSPITAL_COMMUNITY): Payer: Medicare HMO

## 2022-11-27 ENCOUNTER — Observation Stay (HOSPITAL_COMMUNITY)
Admission: EM | Admit: 2022-11-27 | Discharge: 2022-11-28 | Disposition: A | Discharge status: Home / Self Care | Payer: Medicare HMO | Attending: Family Medicine | Admitting: Family Medicine

## 2022-11-27 DIAGNOSIS — E1165 Type 2 diabetes mellitus with hyperglycemia: Secondary | ICD-10-CM | POA: Diagnosis not present

## 2022-11-27 DIAGNOSIS — T783XXA Angioneurotic edema, initial encounter: Principal | ICD-10-CM | POA: Diagnosis present

## 2022-11-27 DIAGNOSIS — R059 Cough, unspecified: Secondary | ICD-10-CM | POA: Diagnosis not present

## 2022-11-27 DIAGNOSIS — N400 Enlarged prostate without lower urinary tract symptoms: Secondary | ICD-10-CM | POA: Diagnosis present

## 2022-11-27 DIAGNOSIS — E860 Dehydration: Secondary | ICD-10-CM | POA: Diagnosis not present

## 2022-11-27 DIAGNOSIS — Z7982 Long term (current) use of aspirin: Secondary | ICD-10-CM | POA: Insufficient documentation

## 2022-11-27 DIAGNOSIS — D72829 Elevated white blood cell count, unspecified: Secondary | ICD-10-CM | POA: Diagnosis not present

## 2022-11-27 DIAGNOSIS — Z794 Long term (current) use of insulin: Secondary | ICD-10-CM | POA: Insufficient documentation

## 2022-11-27 DIAGNOSIS — E1129 Type 2 diabetes mellitus with other diabetic kidney complication: Secondary | ICD-10-CM | POA: Diagnosis not present

## 2022-11-27 DIAGNOSIS — R220 Localized swelling, mass and lump, head: Secondary | ICD-10-CM | POA: Diagnosis present

## 2022-11-27 DIAGNOSIS — Z79899 Other long term (current) drug therapy: Secondary | ICD-10-CM | POA: Insufficient documentation

## 2022-11-27 DIAGNOSIS — E782 Mixed hyperlipidemia: Secondary | ICD-10-CM | POA: Diagnosis not present

## 2022-11-27 DIAGNOSIS — I10 Essential (primary) hypertension: Secondary | ICD-10-CM | POA: Diagnosis present

## 2022-11-27 DIAGNOSIS — R809 Proteinuria, unspecified: Secondary | ICD-10-CM | POA: Diagnosis present

## 2022-11-27 DIAGNOSIS — R609 Edema, unspecified: Secondary | ICD-10-CM | POA: Diagnosis not present

## 2022-11-27 LAB — BASIC METABOLIC PANEL
Anion gap: 17 — ABNORMAL HIGH (ref 5–15)
BUN: 15 mg/dL (ref 8–23)
CO2: 19 mmol/L — ABNORMAL LOW (ref 22–32)
Calcium: 9.3 mg/dL (ref 8.9–10.3)
Chloride: 99 mmol/L (ref 98–111)
Creatinine, Ser: 1.4 mg/dL — ABNORMAL HIGH (ref 0.61–1.24)
GFR, Estimated: 54 mL/min — ABNORMAL LOW (ref >60)
Glucose, Bld: 259 mg/dL — ABNORMAL HIGH (ref 70–99)
Potassium: 3.6 mmol/L (ref 3.5–5.1)
Sodium: 135 mmol/L (ref 135–145)

## 2022-11-27 LAB — CBC
HCT: 36 % — ABNORMAL LOW (ref 39.0–52.0)
Hemoglobin: 12.5 g/dL — ABNORMAL LOW (ref 13.0–17.0)
MCH: 30.8 pg (ref 26.0–34.0)
MCHC: 34.7 g/dL (ref 30.0–36.0)
MCV: 88.7 fL (ref 80.0–100.0)
Platelets: 325 10*3/uL (ref 150–400)
RBC: 4.06 MIL/uL — ABNORMAL LOW (ref 4.22–5.81)
RDW: 11.8 % (ref 11.5–15.5)
WBC: 15 10*3/uL — ABNORMAL HIGH (ref 4.0–10.5)
nRBC: 0 % (ref 0.0–0.2)

## 2022-11-27 LAB — CBG MONITORING, ED: Glucose-Capillary: 129 mg/dL — ABNORMAL HIGH (ref 70–99)

## 2022-11-27 MED ORDER — DIPHENHYDRAMINE HCL 25 MG PO CAPS: 25.0000 mg | ORAL_CAPSULE | Freq: Four times a day (QID) | ORAL | Status: DC | PRN

## 2022-11-27 MED ORDER — INSULIN ASPART 100 UNIT/ML IJ SOLN
0.0000 [IU] | Freq: Three times a day (TID) | INTRAMUSCULAR | Status: DC
  Administered 2022-11-28: 11 [IU] via SUBCUTANEOUS
  Administered 2022-11-28: 5 [IU] via SUBCUTANEOUS

## 2022-11-27 MED ORDER — DIPHENHYDRAMINE HCL 25 MG PO TABS: 25.0000 mg | ORAL_TABLET | Freq: Four times a day (QID) | ORAL | 0 refills | Status: DC | PRN

## 2022-11-27 MED ORDER — PREDNISONE 20 MG PO TABS: 60.0000 mg | ORAL_TABLET | Freq: Every day | ORAL | 0 refills | Status: DC

## 2022-11-27 MED ORDER — TRANEXAMIC ACID-NACL 1000-0.7 MG/100ML-% IV SOLN
1000.0000 mg | Freq: Once | INTRAVENOUS | Status: AC
  Administered 2022-11-27: 1000 mg via INTRAVENOUS
  Filled 2022-11-27: qty 100

## 2022-11-27 MED ORDER — HEPARIN SODIUM (PORCINE) 5000 UNIT/ML IJ SOLN
5000.0000 [IU] | Freq: Three times a day (TID) | INTRAMUSCULAR | Status: DC
  Administered 2022-11-27 – 2022-11-28 (×2): 5000 [IU] via SUBCUTANEOUS
  Filled 2022-11-27 (×2): qty 1

## 2022-11-27 MED ORDER — ONDANSETRON HCL 4 MG/2ML IJ SOLN: 4.0000 mg | Freq: Four times a day (QID) | INTRAMUSCULAR | Status: DC | PRN

## 2022-11-27 MED ORDER — FAMOTIDINE IN NACL 20-0.9 MG/50ML-% IV SOLN
20.0000 mg | Freq: Once | INTRAVENOUS | Status: AC
  Administered 2022-11-27: 20 mg via INTRAVENOUS
  Filled 2022-11-27: qty 50

## 2022-11-27 MED ORDER — EPINEPHRINE 0.3 MG/0.3ML IJ SOAJ: 0.3000 mg | INTRAMUSCULAR | 0 refills | Status: DC | PRN

## 2022-11-27 MED ORDER — DEXAMETHASONE SODIUM PHOSPHATE 10 MG/ML IJ SOLN
10.0000 mg | Freq: Once | INTRAMUSCULAR | Status: AC
  Administered 2022-11-27: 10 mg via INTRAVENOUS
  Filled 2022-11-27: qty 1

## 2022-11-27 MED ORDER — ACETAMINOPHEN 325 MG PO TABS: 650.0000 mg | ORAL_TABLET | Freq: Four times a day (QID) | ORAL | Status: DC | PRN

## 2022-11-27 MED ORDER — ACETAMINOPHEN 650 MG RE SUPP: 650.0000 mg | Freq: Four times a day (QID) | RECTAL | Status: DC | PRN

## 2022-11-27 MED ORDER — FAMOTIDINE 20 MG PO TABS: 20.0000 mg | ORAL_TABLET | Freq: Two times a day (BID) | ORAL | 0 refills | Status: DC

## 2022-11-27 MED ORDER — DIPHENHYDRAMINE HCL 50 MG/ML IJ SOLN
50.0000 mg | Freq: Once | INTRAMUSCULAR | Status: AC
  Administered 2022-11-27: 50 mg via INTRAVENOUS
  Filled 2022-11-27: qty 1

## 2022-11-27 MED ORDER — SODIUM CHLORIDE 0.9 % IV SOLN: INTRAVENOUS | Status: DC

## 2022-11-27 MED ORDER — DEXAMETHASONE SODIUM PHOSPHATE 10 MG/ML IJ SOLN
6.0000 mg | INTRAMUSCULAR | Status: DC
  Filled 2022-11-27: qty 0.6

## 2022-11-27 MED ORDER — FAMOTIDINE 20 MG PO TABS: 20.0000 mg | ORAL_TABLET | Freq: Two times a day (BID) | ORAL | Status: DC | PRN

## 2022-11-27 MED ORDER — ONDANSETRON HCL 4 MG PO TABS: 4.0000 mg | ORAL_TABLET | Freq: Four times a day (QID) | ORAL | Status: DC | PRN

## 2022-11-27 NOTE — ED Notes (Signed)
Got patient into a gown on the monitor patient is resting with call bell in reach 

## 2022-11-27 NOTE — ED Notes (Signed)
ED TO INPATIENT HANDOFF REPORT  ED Nurse Name and Phone #: Aundrea Higginbotham, RN [phone]  S Name/Age/Gender Logan Knight 72 y.o. male Room/Bed: 016C/016C  Code Status   Code Status: Prior  Home/SNF/Other Home Patient oriented to: self, place, time, and situation Is this baseline? Yes   Triage Complete: Triage complete  Chief Complaint Angioedema [T78.3XXA]  Triage Note Pt BIBGEMS from home for sudden onset angioedema. Edema to upper and lower lips. Pt airway intact and breath sounds clear. Pt denies trouble breathing or swallowing. Pt is alert and oriented speaking in full sentences with no apparent distress. Same thing happened a few years ago while taking Lisinopril. Pt was taken off immediately and changed medications. VSS.   Allergies Allergies  Allergen Reactions   Lisinopril Swelling    Level of Care/Admitting Diagnosis ED Disposition     ED Disposition  Admit   Condition  --   Comment  Hospital Area: [facility] [id number]  Level of Care: Med-Surg [16]  May place patient in observation at [facility] or [place] if equivalent level of care is available:: Yes  Covid Evaluation: Asymptomatic - no recent exposure (last 10 days) testing not required  Diagnosis: Angioedema [id number]  Admitting Physician: Bernadette Hoit [id number]  Attending Physician: Bernadette Hoit [id number]          B Medical/Surgery History Past Medical History:  Diagnosis Date   Arthritis    "left knee" (09/13/2018)   History of blood transfusion 1990   "when I got shot"   Hypertension    Hypertensive retinopathy    SBO (small bowel obstruction) ([place]) 09/13/2018   "this is my 41rd in over 72 yrs" (09/13/2018)   Past Surgical History:  Procedure Laterality Date   [place] X 4   "related to [place]"; ex lap with a colectomy/colostomy and subsequent takedown/notes 09/13/2018   CATARACT EXTRACTION Right    COLECTOMY  1990   COLOSTOMY  1990    COLOSTOMY TAKEDOWN  1990   EVALUATION UNDER ANESTHESIA WITH HEMORRHOIDECTOMY N/A 03/03/2021   Procedure: EXAM UNDER ANESTHESIA WITH HEMORRHOIDECTOMY WITH LIGATION AND HEMORRHOIDOPEXY;  Surgeon: Michael Boston, MD;  Location: [facility];  Service: General;  Laterality: N/A;   MASS EXCISION N/A 03/03/2021   Procedure: EXCISION OF ANAL MASSES;  Surgeon: Michael Boston, MD;  Location: [facility];  Service: General;  Laterality: N/A;   [place] Right 2006     A IV Location/Drains/Wounds Patient Lines/Drains/Airways Status     Active Line/Drains/Airways     Name Placement date Placement time Site Days   Peripheral IV 11/27/22 18 G Left;Posterior Forearm 11/27/22  1227  Forearm  less than 1   Airway 7.5 mm 03/03/21  1305  -- 634   Incision (Closed) 03/03/21 Rectum 03/03/21  1334  -- 634            Intake/Output Last 24 hours No intake or output data in the 24 hours ending 11/27/22 1833  Labs/Imaging Results for orders placed or performed during the hospital encounter of 11/27/22 (from the past 48 hour(s))  CBG monitoring, ED     Status: Abnormal   Collection Time: 11/27/22 12:24 PM  Result Value Ref Range   Glucose-Capillary 129 (H) 70 - 99 mg/dL    Comment: Glucose reference range applies only to samples taken after fasting for at least 8 hours.  CBC     Status: Abnormal   Collection Time: 11/27/22  5:06 PM  Result Value Ref Range   WBC 15.0 (H) 4.0 - 10.5 K/uL   RBC 4.06 (L) 4.22 - 5.81 MIL/uL   Hemoglobin 12.5 (L) 13.0 - 17.0 g/dL   HCT 36.0 (L) 39.0 - 52.0 %   MCV 88.7 80.0 - 100.0 fL   MCH 30.8 26.0 - 34.0 pg   MCHC 34.7 30.0 - 36.0 g/dL   RDW 11.8 11.5 - 15.5 %   Platelets 325 150 - 400 K/uL   nRBC 0.0 0.0 - 0.2 %    Comment: Performed at [facility], [place] [address], [place], [place] [id number]  Basic metabolic panel     Status: Abnormal   Collection Time: 11/27/22  5:06 PM  Result Value Ref Range   Sodium 135 135 -  145 mmol/L   Potassium 3.6 3.5 - 5.1 mmol/L   Chloride 99 98 - 111 mmol/L   CO2 19 (L) 22 - 32 mmol/L   Glucose, Bld 259 (H) 70 - 99 mg/dL    Comment: Glucose reference range applies only to samples taken after fasting for at least 8 hours.   BUN 15 8 - 23 mg/dL   Creatinine, Ser 1.40 (H) 0.61 - 1.24 mg/dL   Calcium 9.3 8.9 - 10.3 mg/dL   GFR, Estimated 54 (L) >60 mL/min    Comment: (NOTE) Calculated using the CKD-EPI Creatinine Equation (2021)    Anion gap 17 (H) 5 - 15    Comment: Performed at [facility] [address], [place], [place] [address]   DG Chest Portable 1 View  Result Date: 11/27/2022 CLINICAL DATA:  Cough EXAM: PORTABLE CHEST 1 VIEW COMPARISON:  CXR 03/02/22 FINDINGS: No pleural effusion. No pneumothorax. Normal cardiac and mediastinal contours. No focal airspace opacity. No radiographically apparent displaced rib fractures. Visualized upper abdomen is unremarkable. Degenerative changes of the bilateral AC joints. IMPRESSION: No focal airspace opacity. Electronically Signed   By: Marin Roberts M.D.   On: 11/27/2022 13:05    Pending Labs Unresulted Labs (From admission, onward)    None       Vitals/Pain Today's Vitals   11/27/22 1211 11/27/22 1400 11/27/22 1730  BP: 127/82 118/79 128/86  Pulse: 93 87 95  Resp: 16 (!) 24 16  Temp: 97.9 F (36.6 C)    TempSrc: Oral    SpO2: 99% 99% 100%    Isolation Precautions No active isolations  Medications Medications  dexamethasone (DECADRON) injection 10 mg (10 mg Intravenous Given 11/27/22 1232)  famotidine (PEPCID) IVPB 20 mg premix (0 mg Intravenous Stopped 11/27/22 1409)  diphenhydrAMINE (BENADRYL) injection 50 mg (50 mg Intravenous Given 11/27/22 1231)  tranexamic acid (CYKLOKAPRON) IVPB 1,000 mg (0 mg Intravenous Stopped 11/27/22 1659)    Mobility walks     Focused Assessments    R Recommendations: See Admitting Provider Note  Report given to:   Additional Notes: Patient is A&Ox4,  ambulatory, no complaints other than his lips being swollen. He had a dry cough but it has resolved with the decadron and other meds. Swallows pills without problems.
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
## 2022-11-27 NOTE — ED Triage Notes (Addendum)
Pt BIBGEMS from home for sudden onset angioedema. Edema to upper and lower lips. Pt airway intact and breath sounds clear. Pt denies trouble breathing or swallowing. Pt is alert and oriented speaking in full sentences with no apparent distress. Same thing happened a few years ago while taking Lisinopril. Pt was taken off immediately and changed medications. VSS.

## 2022-11-27 NOTE — ED Provider Notes (Signed)
[facility] Provider Note   CSN: [id number] Arrival date & time: 11/27/22  1208     History  Chief Complaint  Patient presents with   Facial Swelling    Logan Knight is a 72 y.o. male.  With PMH of HTN, DM, BPH who presents with angioedema of the lips.  This has happened in the past when he was on lisinopril many years ago however he no longer takes any ACE or ARB medications.  The only new medications he has been taking has been ciprofloxacin over the past week and increase his Flomax dose to twice a day.  He woke up this morning feeling discomfort in his lips.  He noticed swelling in his lips around 8:30 in the morning.  There is been some mild progression of both lips since this morning.  He has had no tongue swelling, no difficulty breathing, no difficulty swallowing, no voice change, no vomiting, no rash, no itching or other complaints.  HPI     Home Medications Prior to Admission medications   Medication Sig Start Date End Date Taking? Authorizing Provider  amLODipine (NORVASC) 10 MG tablet Take 1 tablet (10 mg total) by mouth daily. 03/31/22   Argentina Donovan, PA-C  aspirin EC 81 MG tablet Take 1 tablet (81 mg total) by mouth daily. Patient not taking: Reported on 11/04/2022 02/06/17   Boykin Nearing, MD  atorvastatin (LIPITOR) 40 MG tablet Take 1 tablet (40 mg total) by mouth daily. 03/31/22   Argentina Donovan, PA-C  Blood Glucose Monitoring Suppl (TRUE METRIX METER) w/Device KIT USE AS DIRECTED 10/21/20   Ladell Pier, MD  carvedilol (COREG) 12.5 MG tablet Take 1 tablet (12.5 mg total) by mouth 2 (two) times daily with a meal. 07/05/22   Ladell Pier, MD  glucose blood (TRUE METRIX BLOOD GLUCOSE TEST) test strip Check blood sugar once daily. Dx: E11.29 01/05/21   Ladell Pier, MD  hydrochlorothiazide (HYDRODIURIL) 25 MG tablet TAKE 1 TABLET EVERY DAY 11/02/22   Ladell Pier, MD  metFORMIN (GLUCOPHAGE) 1000 MG  tablet Take 1 tablet (1,000 mg total) by mouth 2 (two) times daily with a meal. 07/05/22   Ladell Pier, MD  sildenafil (VIAGRA) 100 MG tablet TAKE 1/2 TO 1 TABLET AS NEEDED 1/2 HOUR PRIOR TO INTERCOURSE. LIMIT USE TO 1 TABLET PER 24 HOURS Patient not taking: Reported on 08/09/2022 04/15/22   Ladell Pier, MD  tamsulosin (FLOMAX) 0.4 MG CAPS capsule Take 1 capsule (0.4 mg total) by mouth daily. 03/31/22   Argentina Donovan, PA-C  TRUEplus Lancets 33G MISC TEST BLOOD SUGAR EVERY DAY AS DIRECTED (ONCE) 08/26/21   Ladell Pier, MD      Allergies    Lisinopril    Review of Systems   Review of Systems  Physical Exam Updated Vital Signs BP 127/82 (BP Location: Right Arm)   Pulse 93   Temp 97.9 F (36.6 C) (Oral)   Resp 16   SpO2 99%  Physical Exam  ED Results / Procedures / Treatments   Labs (all labs ordered are listed, but only abnormal results are displayed) Labs Reviewed  CBG MONITORING, ED - Abnormal; Notable for the following components:      Result Value   Glucose-Capillary 129 (*)    All other components within normal limits    EKG None  Radiology No results found.  Procedures Procedures  {Document cardiac monitor, telemetry assessment procedure when  appropriate:1}  Medications Ordered in ED Medications  famotidine (PEPCID) IVPB 20 mg premix (20 mg Intravenous New Bag/Given 11/27/22 1237)  dexamethasone (DECADRON) injection 10 mg (10 mg Intravenous Given 11/27/22 1232)  diphenhydrAMINE (BENADRYL) injection 50 mg (50 mg Intravenous Given 11/27/22 1231)    ED Course/ Medical Decision Making/ A&P Clinical Course as of 11/27/22 1756  Sun Nov 27, 2022  1425 Patient still has stable bilateral lip angioedema.  No worsening but no significant improvement.  Still maintaining airway and perfect oxygen saturation on room air. [VB]  [id number] Patient still has bilateral lip angioedema once again still no improvement but no worsening.  I have ordered for IV TXA,  unclear if this is hereditary angioedema.  Requesting admission for continued airway monitoring with hospitalist service. [VB]  [id number] Tried to admit patient but hospitalist requesting basic labs.  Basic labs ordered will be sent. [VB]    Clinical Course User Index [VB] Elgie Congo, MD   {    Medical Decision Making Amount and/or Complexity of Data Reviewed Labs: ordered. Radiology: ordered.  Risk OTC drugs. Prescription drug management.    Final Clinical Impression(s) / ED Diagnoses Final diagnoses:  None    Rx / DC Orders ED Discharge Orders     None

## 2022-11-28 DIAGNOSIS — T783XXA Angioneurotic edema, initial encounter: Secondary | ICD-10-CM | POA: Diagnosis not present

## 2022-11-28 LAB — GLUCOSE, CAPILLARY
Glucose-Capillary: 365 mg/dL — ABNORMAL HIGH (ref 70–99)
Glucose-Capillary: 310 mg/dL — ABNORMAL HIGH (ref 70–99)
Glucose-Capillary: 235 mg/dL — ABNORMAL HIGH (ref 70–99)

## 2022-11-28 LAB — COMPREHENSIVE METABOLIC PANEL
ALT: 20 U/L (ref 0–44)
AST: 18 U/L (ref 15–41)
Albumin: 2.9 g/dL — ABNORMAL LOW (ref 3.5–5.0)
Alkaline Phosphatase: 65 U/L (ref 38–126)
Anion gap: 12 (ref 5–15)
BUN: 18 mg/dL (ref 8–23)
CO2: 23 mmol/L (ref 22–32)
Calcium: 9.3 mg/dL (ref 8.9–10.3)
Chloride: 97 mmol/L — ABNORMAL LOW (ref 98–111)
Creatinine, Ser: 1.32 mg/dL — ABNORMAL HIGH (ref 0.61–1.24)
GFR, Estimated: 58 mL/min — ABNORMAL LOW (ref >60)
Glucose, Bld: 313 mg/dL — ABNORMAL HIGH (ref 70–99)
Potassium: 3.5 mmol/L (ref 3.5–5.1)
Sodium: 132 mmol/L — ABNORMAL LOW (ref 135–145)
Total Bilirubin: 0.5 mg/dL (ref 0.3–1.2)
Total Protein: 7.5 g/dL (ref 6.5–8.1)

## 2022-11-28 LAB — CBC
HCT: 35.8 % — ABNORMAL LOW (ref 39.0–52.0)
Hemoglobin: 12.1 g/dL — ABNORMAL LOW (ref 13.0–17.0)
MCH: 29.7 pg (ref 26.0–34.0)
MCHC: 33.8 g/dL (ref 30.0–36.0)
MCV: 88 fL (ref 80.0–100.0)
Platelets: 322 10*3/uL (ref 150–400)
RBC: 4.07 MIL/uL — ABNORMAL LOW (ref 4.22–5.81)
RDW: 11.6 % (ref 11.5–15.5)
WBC: 12 10*3/uL — ABNORMAL HIGH (ref 4.0–10.5)
nRBC: 0 % (ref 0.0–0.2)

## 2022-11-28 LAB — PHOSPHORUS: Phosphorus: 1.7 mg/dL — ABNORMAL LOW (ref 2.5–4.6)

## 2022-11-28 LAB — MAGNESIUM: Magnesium: 1.6 mg/dL — ABNORMAL LOW (ref 1.7–2.4)

## 2022-11-28 MED ORDER — INSULIN ASPART 100 UNIT/ML IJ SOLN
5.0000 [IU] | Freq: Once | INTRAMUSCULAR | Status: AC
  Administered 2022-11-28: 5 [IU] via SUBCUTANEOUS

## 2022-11-28 MED ORDER — K PHOS MONO-SOD PHOS DI & MONO 155-852-130 MG PO TABS
500.0000 mg | ORAL_TABLET | Freq: Three times a day (TID) | ORAL | Status: DC
  Administered 2022-11-28: 500 mg via ORAL
  Filled 2022-11-28 (×3): qty 2

## 2022-11-28 MED ORDER — CARVEDILOL 12.5 MG PO TABS
12.5000 mg | ORAL_TABLET | Freq: Two times a day (BID) | ORAL | Status: DC
  Administered 2022-11-28: 12.5 mg via ORAL
  Filled 2022-11-28: qty 1

## 2022-11-28 MED ORDER — AMLODIPINE BESYLATE 10 MG PO TABS
10.0000 mg | ORAL_TABLET | Freq: Every day | ORAL | Status: DC
  Administered 2022-11-28: 10 mg via ORAL
  Filled 2022-11-28: qty 1

## 2022-11-28 MED ORDER — ATORVASTATIN CALCIUM 40 MG PO TABS
40.0000 mg | ORAL_TABLET | Freq: Every day | ORAL | Status: DC
  Administered 2022-11-28: 40 mg via ORAL
  Filled 2022-11-28: qty 1

## 2022-11-28 MED ORDER — MAGNESIUM SULFATE 2 GM/50ML IV SOLN
2.0000 g | Freq: Once | INTRAVENOUS | Status: AC
  Administered 2022-11-28: 2 g via INTRAVENOUS
  Filled 2022-11-28: qty 50

## 2022-11-28 NOTE — Social Work (Signed)
  Transition of Care [facility]) Screening Note   Patient Details  Name: Logan Knight Date of Birth: [birth year]   Transition of Care [facility]) CM/SW Contact:    Coralee Pesa, [place] Phone Number: 11/28/2022, 1:33 PM    Transition of Care Department [facility]) has reviewed patient and no TOC needs have been identified at this time. We will continue to monitor patient advancement through interdisciplinary progression rounds. If new patient transition needs arise, please place a TOC consult.

## 2022-11-28 NOTE — Progress Notes (Signed)
Patient transfer from ED,alert and oriented,no complain of pain,patient has home medication,no narcotic and advised to send it home or have pharmacy to keep it for him.Patient stated will sent it home,medication in patients draw  outside  of his room.Will continue to monitor.

## 2022-11-28 NOTE — Hospital Course (Signed)
Mr. Sabet is a 72 y.o. M with HTN, HLD, BPH and prior episode ACEi angioedema who presented with lip swelling

## 2022-11-28 NOTE — Inpatient Diabetes Management (Addendum)
Inpatient Diabetes Program Recommendations  AACE/ADA: New Consensus Statement on Inpatient Glycemic Control (2015)  Target Ranges:  Prepandial:   less than 140 mg/dL      Peak postprandial:   less than 180 mg/dL (1-2 hours)      Critically ill patients:  140 - 180 mg/dL   Lab Results  Component Value Date   GLUCAP 310 (H) 11/28/2022   HGBA1C 6.5 11/04/2022    Latest Reference Range & Units 11/27/22 12:24 11/28/22 02:49 11/28/22 07:54  Glucose-Capillary 70 - 99 mg/dL 129 (H) 365 (H) 310 (H)  (H): Data is abnormally high  Diabetes history: DM2 Outpatient Diabetes medications: Metformin 1 gm bid Current orders for Inpatient glycemic control: Novolog 0-15 units tid correction, Decadron 6 mg qd (received Decadron 10 mg on 3/31)  Inpatient Diabetes Program Recommendations:   Hyperglycemia with administration of Decadron. While on steroids, please consider: -Change Novolog 0-15 units to q 4 hrs. Discussed with Dr. Loleta Books and plans to D/C patient home today without steroids.  Thank you, Nani Gasser. Raygan Skarda, RN, MSN, CDE  Diabetes Coordinator Inpatient Glycemic Control Team Team Pager [phone] (8am-5pm) 11/28/2022 9:43 AM

## 2022-11-28 NOTE — Discharge Summary (Signed)
Physician Discharge Summary   Patient: Logan Knight MRN: [id number] DOB: 01-Jun-1951  Admit date:     11/27/2022  Discharge date: 11/28/22  Discharge Physician: Logan Knight   PCP: Logan Pier, MD     Recommendations at discharge:  Follow up with PCP Dr. Wynetta Knight in 1 week Follow up with Urology Dr. Gloriann Knight as soon as able Dr. Gloriann Knight: Patient's angioedema occurred shortly after taking ciprofloxacin; given timing, it appeared the most likely cause of his angioedema He had about 6 days of Cipro completed at time of reaction, and had improving urinary irritative symptoms If patient requires ongoing antibiotic therapy, please adjust based on your diagnosis/culture data  Dr. Wynetta Knight or Dr. Gloriann Knight: If use of Cipro in the future is needed, consider Allergy/Immunology referral for further evaluation     Discharge Diagnoses: Principal Problem:   Angioedema, suspected due to Cipro Active Problems:   Mixed hyperlipidemia   Essential hypertension   Type 2 diabetes mellitus with microalbuminuria, without long-term current use of insulin   BPH (benign prostatic hyperplasia)     Hospital Course: Logan Knight is a 72 y.o. M with HTN, HLD, BPH and prior episode ACEi angioedema who presented with lip swelling   Patient had completed about 1 week of new Cipro when, on the day of admission, about 30-60 minutes after his morning medicines, he started to having itching and fullness of the upper lip, and came to the ER.  In the ER, he was given steroids, antihistamines and observed overnight  Swelling completely resolved, and he was able to tolerate oral diet, had no ongoing symptoms.  Given new Cipro, timing of onset after morning meds, no other discernibile trigger, and NO recent ACEi use, ciprofloxacin seems the most likely cause of his symptoms.          The [facility] Controlled Substances Registry was reviewed for this patient prior to discharge.   Consultants:   None Procedures performed: None  Disposition: Home   DISCHARGE MEDICATION: Allergies as of 11/28/2022       Reactions   Ciprofloxacin Swelling   Angioedema 11/27/22   Lisinopril Swelling        Medication List     TAKE these medications    amLODipine 10 MG tablet Commonly known as: NORVASC Take 1 tablet (10 mg total) by mouth daily.   aspirin EC 81 MG tablet Take 1 tablet (81 mg total) by mouth daily.   atorvastatin 40 MG tablet Commonly known as: LIPITOR Take 1 tablet (40 mg total) by mouth daily.   carvedilol 12.5 MG tablet Commonly known as: COREG Take 1 tablet (12.5 mg total) by mouth 2 (two) times daily with a meal.   hydrochlorothiazide 25 MG tablet Commonly known as: HYDRODIURIL TAKE 1 TABLET EVERY DAY   metFORMIN 1000 MG tablet Commonly known as: GLUCOPHAGE Take 1 tablet (1,000 mg total) by mouth 2 (two) times daily with a meal.   sildenafil 100 MG tablet Commonly known as: VIAGRA TAKE 1/2 TO 1 TABLET AS NEEDED 1/2 HOUR PRIOR TO INTERCOURSE. LIMIT USE TO 1 TABLET PER 24 HOURS   tamsulosin 0.4 MG Caps capsule Commonly known as: FLOMAX Take 1 capsule (0.4 mg total) by mouth daily.   True Metrix Blood Glucose Test test strip Generic drug: glucose blood Check blood sugar once daily. Dx: E11.29   True Metrix Meter w/Device Kit USE AS DIRECTED   TRUEplus Lancets 33G Misc TEST BLOOD SUGAR EVERY DAY AS DIRECTED (ONCE)  Follow-up Information     Logan Pier, MD Follow up.   Specialty: Internal Medicine Contact information: [address] [place] [address] [phone]         Logan Mallow, MD. Call.   Specialty: Urology Why: To ask about changing the antibiotic Contact information: [place] [address] [phone]                 Discharge Instructions     Discharge instructions   Complete by: As directed    **IMPORTANT DISCHARGE INSTRUCTIONS**   From Dr.  Loleta Knight: You were admitted for lip swelling. The medical term for it is "angioedema".  This lip swelling has many causes, but in your case, we believe it was related to the antibiotic you just started with Dr. Gloriann Knight, ciprofloxacin  STOP the antibiotic ciprofloxacin/Cipro Do not take Cipro again, and let people know you have an allergy to it  Call Logan Knight office, and let them know you had lip swelling after taking a dose of Cipro and that you were admitted to the hospital and the doctors there suspected your reaction was "angio edema from Cipro" and they stopped it  Tell Logan Knight nurse that your symptoms were better with taking the Cipro, but because of the allergic reaction, the hospital doctors wanted Dr. Gloriann Knight to review your chart and recommend another antibiotic if needed  Go see Dr. Wynetta Knight in 1 week  Resume your other home medicines, including you MAY take tamsulosin/Flomax twice daily   Increase activity slowly   Complete by: As directed        Discharge Exam: There were no vitals filed for this visit.  General: Pt is alert, awake, not in acute distress Cardiovascular: RRR, nl S1-S2, no murmurs appreciated.   No LE edema.   Respiratory: Normal respiratory rate and rhythm.  CTAB without rales or wheezes. Abdominal: Abdomen soft and non-tender.  No distension or HSM.   Neuro/Psych: Strength symmetric in upper and lower extremities.  Judgment and insight appear normal.   Condition at discharge: good  The results of significant diagnostics from this hospitalization (including imaging, microbiology, ancillary and laboratory) are listed below for reference.   Imaging Studies: DG Chest Portable 1 View  Result Date: 11/27/2022 CLINICAL DATA:  Cough EXAM: PORTABLE CHEST 1 VIEW COMPARISON:  CXR 03/02/22 FINDINGS: No pleural effusion. No pneumothorax. Normal cardiac and mediastinal contours. No focal airspace opacity. No radiographically apparent displaced rib fractures. Visualized  upper abdomen is unremarkable. Degenerative changes of the bilateral AC joints. IMPRESSION: No focal airspace opacity. Electronically Signed   By: Logan Knight M.D.   On: 11/27/2022 13:05    Microbiology: Results for orders placed or performed in visit on 11/04/22  Novel Coronavirus, NAA ([facility])     Status: None   Collection Time: 11/04/22 10:30 AM   Specimen: Nasopharyngeal(NP) swabs in vial transport medium  Result Value Ref Range Status   SARS-CoV-2, NAA Not Detected Not Detected Final    Comment: This nucleic acid amplification test was developed and its performance characteristics determined by [employer]. Nucleic acid amplification tests include RT-PCR and TMA. This test has not been FDA cleared or approved. This test has been authorized by FDA under an Emergency Use Authorization (EUA). This test is only authorized for the duration of time the declaration that circumstances exist justifying the authorization of the emergency use of in vitro diagnostic tests for detection of SARS-CoV-2 virus and/or diagnosis of  COVID-19 infection under section 564(b)(1) of the Act, 21 U.S.C. [id number]) (1), unless the authorization is terminated or revoked sooner. When diagnostic testing is negative, the possibility of a false negative result should be considered in the context of a patient's recent exposures and the presence of clinical signs and symptoms consistent with COVID-19. An individual without symptoms of COVID-19 and who is not shedding SARS-CoV-2 virus wo uld expect to have a negative (not detected) result in this assay.     Labs: CBC: Recent Labs  Lab 11/27/22 1706 11/28/22 0234  WBC 15.0* 12.0*  HGB 12.5* 12.1*  HCT 36.0* 35.8*  MCV 88.7 88.0  PLT 325 [id number]   Basic Metabolic Panel: Recent Labs  Lab 11/27/22 1706 11/28/22 0234  NA 135 132*  K 3.6 3.5  CL 99 97*  CO2 19* 23  GLUCOSE 259* 313*  BUN 15 18  CREATININE 1.40* 1.32*  CALCIUM 9.3 9.3  MG   --  1.6*  PHOS  --  1.7*   Liver Function Tests: Recent Labs  Lab 11/28/22 0234  AST 18  ALT 20  ALKPHOS 65  BILITOT 0.5  PROT 7.5  ALBUMIN 2.9*   CBG: Recent Labs  Lab 11/27/22 1224 11/28/22 0249 11/28/22 0754 11/28/22 1209  GLUCAP 129* 365* 310* 235*    Discharge time spent: approximately 35 minutes spent on discharge counseling, evaluation of patient on day of discharge, and coordination of discharge planning with nursing, social work, pharmacy and case management  Signed: Edwin Dada, MD [facility] 11/28/2022

## 2022-11-29 ENCOUNTER — Telehealth: Payer: Self-pay | Admitting: Internal Medicine

## 2022-11-29 LAB — HEMOGLOBIN A1C
Hgb A1c MFr Bld: 7.9 % — ABNORMAL HIGH (ref 4.8–5.6)
Mean Plasma Glucose: 180 mg/dL

## 2022-11-29 NOTE — Telephone Encounter (Signed)
Copied from [place] [id number]. Topic: General - Inquiry >> Nov 29, 2022 11:56 AM Erskine Squibb wrote: Reason for CRM: The patient called and wanted to let his provider know he is trying to get a hold of Dr Marlan Palau office to get a prescription from him but he is having no success. He is going to keep trying. Please assist patient further

## 2022-11-30 ENCOUNTER — Other Ambulatory Visit: Payer: Self-pay | Admitting: Internal Medicine

## 2022-11-30 ENCOUNTER — Telehealth: Payer: Self-pay | Admitting: Internal Medicine

## 2022-11-30 MED ORDER — SULFAMETHOXAZOLE-TRIMETHOPRIM 800-160 MG PO TABS: 1.0000 | ORAL_TABLET | Freq: Two times a day (BID) | ORAL | 0 refills | Status: DC

## 2022-11-30 NOTE — Telephone Encounter (Signed)
Called LVM to call back

## 2022-11-30 NOTE — Telephone Encounter (Signed)
I received a note from Dr. Gloriann Loan.  Patient was seen by him 11/22/2022.  Diagnosed with acute prostatitis.  Plan was to keep him on Cipro for 30 days.  Urine culture sent.

## 2022-12-01 NOTE — Telephone Encounter (Signed)
Pt is calling back stating that he has not gotten in touch with Dr. Gloriann Loan. He called his office yesterday and the nurse advised that he would get a call back and still have not received one yet. Please advise

## 2022-12-02 ENCOUNTER — Ambulatory Visit: Payer: Self-pay | Admitting: *Deleted

## 2022-12-02 NOTE — Telephone Encounter (Signed)
  Chief Complaint: patient requesting PCP advise: which medication to take  Disposition: [] ED /[] Urgent Care (no appt availability in office) / [] Appointment(In office/virtual)/ []  [place] [facility]/ [] Home Care/ [] Refused Recommended Disposition /[] [place] [facility]/ [x]  Follow-up with PCP Additional Notes: Patient has 2 Rx- would like PCP to advise which one to take  Reason for Disposition  [1] Caller has URGENT medicine question about med that PCP or specialist prescribed AND [2] triager unable to answer question  Answer Assessment - Initial Assessment Questions 1. NAME of MEDICINE: "What medicine(s) are you calling about?"     sulfamethoxazole-trimethoprim (BACTRIM DS) 800-160 MG tablet    Cefpodoxime proxetil 2. QUESTION: "What is your question?" (e.g., double dose of medicine, side effect)     Patient states he has 2 antibiotic Rx- one from Dr Laural Benes and one from Dr Alvester Morin- he is presently taking the Bactrim DS- he would like to know if he should stop and take the new Rx- or continue with Rx provided by Dr Laural Benes 3. PRESCRIBER: "Who prescribed the medicine?" Reason: if prescribed by specialist, call should be referred to that group.     PCP  Please advise patient to continue or change Rx  Protocols used: Medication Question Call-A-AH

## 2022-12-02 NOTE — Telephone Encounter (Signed)
FYI

## 2022-12-02 NOTE — Telephone Encounter (Signed)
Summary: rx concern   The patient shares that they have recently been prescribed an additional antibiotic by Dr. Alvester Morin(?)  The patient is concerned with it interacting with their current antibiotic prescription of sulfamethoxazole-trimethoprim (BACTRIM DS) 800-160 MG tablet [[id number]]  The patient would like to speak with a member of staff further when possible

## 2022-12-02 NOTE — Telephone Encounter (Signed)
Called & spoke to the patient. Verified name & DOB. Received verbal orders from Dr.Johnson to disregard the Batrim antibiotic and only take the prescription that was sent by Dr.Bell. Patient repeated back understanding to only take the prescription sent by Dr.Bell and not what was sent in from Dr.Johnson. No further questions at this time.

## 2022-12-02 NOTE — Telephone Encounter (Signed)
Advised patient to take cephalexin as order by Dr.  Alvester Morin patient's urologist  and stop the Bactrim DS. Patient was very appreciative for the return call.

## 2022-12-05 ENCOUNTER — Encounter: Payer: Self-pay | Admitting: Internal Medicine

## 2022-12-05 ENCOUNTER — Ambulatory Visit: Payer: Medicare HMO | Attending: Internal Medicine | Admitting: Internal Medicine

## 2022-12-05 VITALS — BP 113/70 | HR 97 | Temp 98.6°F | Ht 73.0 in | Wt 209.0 lb

## 2022-12-05 DIAGNOSIS — E1129 Type 2 diabetes mellitus with other diabetic kidney complication: Secondary | ICD-10-CM | POA: Diagnosis not present

## 2022-12-05 DIAGNOSIS — I700 Atherosclerosis of aorta: Secondary | ICD-10-CM

## 2022-12-05 DIAGNOSIS — I152 Hypertension secondary to endocrine disorders: Secondary | ICD-10-CM | POA: Diagnosis not present

## 2022-12-05 DIAGNOSIS — E1159 Type 2 diabetes mellitus with other circulatory complications: Secondary | ICD-10-CM

## 2022-12-05 DIAGNOSIS — E1169 Type 2 diabetes mellitus with other specified complication: Secondary | ICD-10-CM

## 2022-12-05 DIAGNOSIS — N289 Disorder of kidney and ureter, unspecified: Secondary | ICD-10-CM

## 2022-12-05 DIAGNOSIS — E785 Hyperlipidemia, unspecified: Secondary | ICD-10-CM | POA: Diagnosis not present

## 2022-12-05 DIAGNOSIS — R809 Proteinuria, unspecified: Secondary | ICD-10-CM

## 2022-12-05 NOTE — Patient Instructions (Signed)
Your A1c for the diabetes has increased.  Blood sugars probably running higher since recent infection of the prostate.   We will have you see our clinical pharmacist sometime this week to be shown how to use your glucometer so that you can check your blood sugars at least once a day in the mornings before breakfast.  Bring your glucometer and test strips to that visit.  Please bring a log of your blood sugar readings with you in 1 month on follow-up appointment with me.
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
## 2022-12-06 LAB — BASIC METABOLIC PANEL
BUN/Creatinine Ratio: 9 — ABNORMAL LOW (ref 10–24)
BUN: 12 mg/dL (ref 8–27)
CO2: 22 mmol/L (ref 20–29)
Calcium: 9.6 mg/dL (ref 8.6–10.2)
Chloride: 101 mmol/L (ref 96–106)
Creatinine, Ser: 1.31 mg/dL — ABNORMAL HIGH (ref 0.76–1.27)
Glucose: 141 mg/dL — ABNORMAL HIGH (ref 70–99)
Potassium: 4 mmol/L (ref 3.5–5.2)
Sodium: 144 mmol/L (ref 134–144)
eGFR: 58 mL/min/{1.73_m2} — ABNORMAL LOW (ref >59)

## 2022-12-06 LAB — MICROALBUMIN / CREATININE URINE RATIO
Creatinine, Urine: 187.7 mg/dL
Microalb/Creat Ratio: 14 mg/g creat (ref 0–29)
Microalbumin, Urine: 25.7 ug/mL

## 2022-12-14 ENCOUNTER — Ambulatory Visit (INDEPENDENT_AMBULATORY_CARE_PROVIDER_SITE_OTHER): Payer: Medicare HMO | Admitting: Orthopaedic Surgery

## 2022-12-14 ENCOUNTER — Telehealth: Payer: Self-pay | Admitting: *Deleted

## 2022-12-14 ENCOUNTER — Telehealth: Payer: Self-pay

## 2022-12-14 DIAGNOSIS — M25562 Pain in left knee: Secondary | ICD-10-CM

## 2022-12-14 DIAGNOSIS — G8929 Other chronic pain: Secondary | ICD-10-CM

## 2022-12-14 DIAGNOSIS — M1712 Unilateral primary osteoarthritis, left knee: Secondary | ICD-10-CM | POA: Diagnosis not present

## 2022-12-14 DIAGNOSIS — E119 Type 2 diabetes mellitus without complications: Secondary | ICD-10-CM

## 2022-12-14 DIAGNOSIS — I10 Essential (primary) hypertension: Secondary | ICD-10-CM

## 2022-12-14 NOTE — Telephone Encounter (Signed)
Patient is requesting labs results from another office. Office notes or lab results are not seen in care everywhere.   Does not have a medical release on file. Will need to come in the office to complete if he request Dr. Laural Benes to get his lab results.   Also did not see the phone number to Dr. Shannan Harper office. Left message on voicemail for patient to return call to relay information.

## 2022-12-14 NOTE — Telephone Encounter (Signed)
Left knee gel injection ?

## 2022-12-14 NOTE — Patient Outreach (Signed)
Received a referral from Rowe Pavy, RN for Care coordination.   I have sent this referral to the [facility] Care Guides to assign a Nurse Care Manager.    Iverson Alamin, Donivan Scull [facility] Care Management Assistant [facility] Care Management [phone]

## 2022-12-14 NOTE — Telephone Encounter (Signed)
-----   Message from Carlean Purl, RN sent at 12/14/2022 10:06 AM EDT ----- Result note read to pt, verbalizes understanding. Pt requesting report from Dr. Shannan Harper office from last visit. States "I can never reach them, tried many times." States would like to know labs and results from OV. Last OV noted 09/15/22. Questioning if Dr. Laural Benes can help retrieve results. Pt states seen later than that date. Please advise.

## 2022-12-14 NOTE — Progress Notes (Signed)
The patient is being seen in follow-up today for his left knee.  He is 72 years old and does have arthritis in his left knee.  We tried a steroid injection in his left knee just 2 months ago and that has really not helped at all.  It lasted for maybe a week.  On exam he does have a large effusion again with his left knee.  There is varus malalignment that is correctable and his knee is very painful for him.  He does not have a history of gout.  I was able to aspirate almost 60 cc of fluid from his left knee that gave him immediate relief.  This is likely osteoarthritis related.  The next step will be trying hyaluronic acid given the continued pain he has with his left knee combined with the recurrent effusions.  He has also failed other conservative treatment measures.  He agrees with this treatment plan.  Will be in touch when hopefully we have this approved in order for his left knee.  This patient is diagnosed with osteoarthritis of the knee(s).    Radiographs show evidence of joint space narrowing, osteophytes, subchondral sclerosis and/or subchondral cysts.  This patient has knee pain which interferes with functional and activities of daily living.    This patient has experienced inadequate response, adverse effects and/or intolerance with conservative treatments such as acetaminophen, NSAIDS, topical creams, physical therapy or regular exercise, knee bracing and/or weight loss.   This patient has experienced inadequate response or has a contraindication to intra articular steroid injections for at least 3 months.   This patient is not scheduled to have a total knee replacement within 6 months of starting treatment with viscosupplementation.

## 2022-12-15 ENCOUNTER — Ambulatory Visit: Payer: Medicare HMO | Attending: Internal Medicine | Admitting: Pharmacist

## 2022-12-15 ENCOUNTER — Encounter: Payer: Self-pay | Admitting: Pharmacist

## 2022-12-15 ENCOUNTER — Telehealth: Payer: Self-pay | Admitting: *Deleted

## 2022-12-15 DIAGNOSIS — E1129 Type 2 diabetes mellitus with other diabetic kidney complication: Secondary | ICD-10-CM

## 2022-12-15 DIAGNOSIS — R809 Proteinuria, unspecified: Secondary | ICD-10-CM

## 2022-12-15 NOTE — Progress Notes (Signed)
Patient was educated on the use of the True Metrix blood glucose meter. Reviewed necessary supplies and operation of the meter. Also reviewed goal blood glucose levels. Patient was able to demonstrate use. All questions and concerns were addressed.  Time spent counseling: 20 minutes  Follow-up: prn   Butch Penny, PharmD, [place], CPP Clinical Pharmacist [facility] [phone]

## 2022-12-15 NOTE — Progress Notes (Signed)
  Care Coordination  Outreach Note  12/15/2022 Name: XAIVIER MALAY MRN: [id number] DOB: April 21, 1951   Care Coordination Outreach Attempts: An unsuccessful telephone outreach was attempted today to offer the patient information about available care coordination services as a benefit of their health plan.   Follow Up Plan:  Additional outreach attempts will be made to offer the patient care coordination information and services.   Encounter Outcome:  No Answer  Christie Nottingham  Care Coordination Care Guide  Direct Dial: [phone]

## 2022-12-15 NOTE — Telephone Encounter (Signed)
VOB submitted for Monovisc, left knee 

## 2022-12-15 NOTE — Progress Notes (Signed)
  Care Coordination   Note   12/15/2022 Name: MODESTO GANOE MRN: [id number] DOB: 06-Feb-1951  Quillian Quince is a 72 y.o. year old male who sees Marcine Matar, MD for primary care. I reached out to Quillian Quince by phone today to offer care coordination services.  Mr. Weltz was given information about Care Coordination services today including:   The Care Coordination services include support from the care team which includes your Nurse Coordinator, Clinical Social Worker, or Pharmacist.  The Care Coordination team is here to help remove barriers to the health concerns and goals most important to you. Care Coordination services are voluntary, and the patient may decline or stop services at any time by request to their care team member.   Care Coordination Consent Status: Patient agreed to services and verbal consent obtained.   Follow up plan:  Telephone appointment with care coordination team member scheduled for:  01/09/23  Encounter Outcome:  Pt. Scheduled  [facility] Coordination Care Guide  Direct Dial: [phone]

## 2022-12-19 ENCOUNTER — Ambulatory Visit: Payer: Self-pay

## 2022-12-19 NOTE — Telephone Encounter (Signed)
  Chief Complaint: Mouth pain Symptoms:  Frequency: 2 days Pertinent Negatives: Patient denies fever, redness swelling Disposition: ED /[x] Urgent Care (no appt availability in office) / Appointment(In office/virtual)/  [facility]/ Home Care/ Refused Recommended Disposition /[] [place] [facility]/  Follow-up with PCP Additional Notes: PT states that mouth pain started 2 days ago. No swelling redness. Pain is 10/10 when liquids come into contact with this area.  Pain is constant. PT will go to UC.  Pt would like a referral for a dentist.    Reason for Disposition  [1] SEVERE mouth pain (e.g., excruciating) AND [2] not improved after 2 hours of pain medicine  Answer Assessment - Initial Assessment Questions 1. ONSET: "When did the mouth start hurting?" (e.g., hours or days ago)      2 days ago - right upper gum 2. SEVERITY: "How bad is the pain?" (Scale 1-10; mild, moderate or severe)   - MILD (1-3):  doesn't interfere with eating or normal activities   - MODERATE (4-7): interferes with eating some solids and normal activities   - SEVERE (8-10):  excruciating pain, interferes with most normal activities   - SEVERE DYSPHAGIA: can't swallow liquids, drooling     10/10 3. SORES: "Are there any sores or ulcers in the mouth?" If Yes, ask: "What part of the mouth are the sores in?"     no 4. FEVER: "Do you have a fever?" If Yes, ask: "What is your temperature, how was it measured, and when did it start?"     no 5. CAUSE: "What do you think is causing the mouth pain?"     Unsure - when eating pain increases 6. OTHER SYMPTOMS: "Do you have any other symptoms?" (e.g., difficulty breathing)     no  Protocols used: Mouth Pain-A-AH

## 2022-12-20 ENCOUNTER — Ambulatory Visit: Payer: Self-pay | Admitting: *Deleted

## 2022-12-20 ENCOUNTER — Encounter (HOSPITAL_COMMUNITY): Payer: Self-pay

## 2022-12-20 ENCOUNTER — Ambulatory Visit (HOSPITAL_COMMUNITY)
Admission: RE | Admit: 2022-12-20 | Discharge: 2022-12-20 | Disposition: A | Discharge status: Home / Self Care | Payer: Medicare HMO | Source: Ambulatory Visit | Attending: Family Medicine | Admitting: Family Medicine

## 2022-12-20 VITALS — BP 113/70 | HR 87 | Temp 97.9°F | Resp 18

## 2022-12-20 DIAGNOSIS — K047 Periapical abscess without sinus: Secondary | ICD-10-CM

## 2022-12-20 MED ORDER — AMOXICILLIN-POT CLAVULANATE 875-125 MG PO TABS: 1.0000 | ORAL_TABLET | Freq: Two times a day (BID) | ORAL | 0 refills | Status: AC

## 2022-12-20 MED ORDER — TRAMADOL HCL 50 MG PO TABS: 50.0000 mg | ORAL_TABLET | Freq: Four times a day (QID) | ORAL | 0 refills | Status: DC | PRN

## 2022-12-20 NOTE — Telephone Encounter (Signed)
Unable to reach message left on VM to return call
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
## 2022-12-20 NOTE — Telephone Encounter (Signed)
Patient called to advise that he does not need a referral to a dentist as he has insurance and all he needs to do is choose a dentist of his choosing and obtain an appointment I have been unable to reach this patient by phone.  A letter is being sent. To reach VM left

## 2022-12-20 NOTE — Discharge Instructions (Addendum)
Stop taking cefpodoxime.  Take amoxicillin-clavulanate 875 mg--1 tab twice daily with food for 10 days.  This is an antibiotic that we will adequately continue the treatment for your urinary infection, and it treats oral infections better.  Take tramadol 50 mg-- 1 tablet every 6 hours as needed for pain.  This medication can make you sleepy or dizzy

## 2022-12-20 NOTE — Telephone Encounter (Signed)
Summary: Antibotics Advice   Pt is calling to speak with the nurse regarding his antibiotics that were changed in the UC today. Please advise

## 2022-12-20 NOTE — Telephone Encounter (Signed)
     Chief Complaint: Pt. States UC told "me to stop the cefpodoxime and start Augmentin for my dental infection." Wants to know from Dr. Laural Benes if he should do this. Symptoms: Dental pain Frequency: 12/15/22. Pertinent Negatives: Patient denies fever Disposition: ED /[] Urgent Care (no appt availability in office) / Appointment(In office/virtual)/  [place] [facility]/ Home Care/ Refused Recommended Disposition /[] [place] [facility]/  Follow-up with PCP Additional Notes: Please advise pt.  Answer Assessment - Initial Assessment Questions 1. NAME of MEDICINE: "What medicine(s) are you calling about?"     Augmentin  2. QUESTION: "What is your question?" (e.g., double dose of medicine, side effect)     Should I stop the other antibiotic from Dr. Alvester Morin 4 weeks ago 3. PRESCRIBER: "Who prescribed the medicine?" Reason: if prescribed by specialist, call should be referred to that group.     UC ordered new antibiotic - Augmentin 4. SYMPTOMS: "Do you have any symptoms?" If Yes, ask: "What symptoms are you having?"  "How bad are the symptoms (e.g., mild, moderate, severe)     Dental pain 5. PREGNANCY:  "Is there any chance that you are pregnant?" "When was your last menstrual period?"     N/a  Protocols used: Medication Question Call-A-AH

## 2022-12-20 NOTE — Telephone Encounter (Signed)
2nd attempt to return his call.  Left a voicemail to call back. 

## 2022-12-20 NOTE — ED Triage Notes (Signed)
Pt states he is having right upper dental pain since last Thursday he has been taking tylenol as need.    He is on cipro for a urinary issue.

## 2022-12-21 NOTE — Telephone Encounter (Signed)
Patient advised to proceed with the antibiotic Augmentin as prescribe. Advised  per UC visit on 12/20/2022 Augmentin can take place of cefpodoxime for the UTI, and then it would also treat any oral infection better. Advise that the UC provider did not want him on two different antibiotic so they give him one that would treat both the dental infection and the UTI. Patient voiced understanding of all discussed.

## 2022-12-23 ENCOUNTER — Telehealth: Payer: Self-pay | Admitting: Orthopaedic Surgery

## 2022-12-23 ENCOUNTER — Other Ambulatory Visit: Payer: Self-pay

## 2022-12-23 DIAGNOSIS — M1712 Unilateral primary osteoarthritis, left knee: Secondary | ICD-10-CM

## 2022-12-23 NOTE — Telephone Encounter (Signed)
Patient called. He would like to know the status of his gel injection. His call back number is [phone]

## 2022-12-23 NOTE — Telephone Encounter (Signed)
Called and left a VM advising patient to CB and schedule for gel injection. See referrals tab

## 2023-01-05 ENCOUNTER — Encounter: Payer: Self-pay | Admitting: Physician Assistant

## 2023-01-05 ENCOUNTER — Ambulatory Visit (INDEPENDENT_AMBULATORY_CARE_PROVIDER_SITE_OTHER): Payer: Medicare HMO | Admitting: Physician Assistant

## 2023-01-05 DIAGNOSIS — R351 Nocturia: Secondary | ICD-10-CM | POA: Diagnosis not present

## 2023-01-05 DIAGNOSIS — M1712 Unilateral primary osteoarthritis, left knee: Secondary | ICD-10-CM

## 2023-01-05 DIAGNOSIS — N401 Enlarged prostate with lower urinary tract symptoms: Secondary | ICD-10-CM | POA: Diagnosis not present

## 2023-01-05 DIAGNOSIS — R3912 Poor urinary stream: Secondary | ICD-10-CM | POA: Diagnosis not present

## 2023-01-05 MED ORDER — LIDOCAINE HCL 1 % IJ SOLN
3.0000 mL | INTRAMUSCULAR | Status: AC | PRN
  Administered 2023-01-05: 3 mL

## 2023-01-05 MED ORDER — HYALURONAN 88 MG/4ML IX SOSY
88.0000 mg | PREFILLED_SYRINGE | INTRA_ARTICULAR | Status: AC | PRN
  Administered 2023-01-05: 88 mg via INTRA_ARTICULAR

## 2023-01-05 NOTE — Progress Notes (Signed)
   Procedure Note  Patient: Logan Knight             Date of Birth: 1950/09/16           MRN: [id number]             Visit Date: 01/05/2023 HPI: Mr. Logan Knight comes in today for scheduled Monovisc injection left knee.  Patient has known arthritis of his knee.  He has tried steroid injections that really did not help.  He is a thin individual.  He has no scheduled surgery in the next 6 months on the left knee.  Physical exam: General well-developed well-nourished male walks with a slight antalgic gait. Left knee: No abnormal warmth erythema.  Positive effusion.  Good range of motion.  Varus malalignment.  Procedures: Visit Diagnoses:  1. Unilateral primary osteoarthritis, left knee     Large Joint Inj: L knee on 01/05/2023 5:07 PM Indications: pain Details: 22 G 1.5 in needle, anterolateral approach  Arthrogram: No  Medications: 88 mg Hyaluronan 88 MG/4ML; 3 mL lidocaine 1 % Aspirate: 15 mL yellow Outcome: tolerated well, no immediate complications Procedure, treatment alternatives, risks and benefits explained, specific risks discussed. Consent was given by the patient. Immediately prior to procedure a time out was called to verify the correct patient, procedure, equipment, support staff and site/side marked as required. Patient was prepped and draped in the usual sterile fashion.     Plan: Will have him follow-up with [place] in 8 weeks see what type of response he had to the Monovisc injection.  He was wrapped with an Ace bandage today which she will remove this evening before retiring to bed.  Questions were encouraged and answered at length.

## 2023-01-08 ENCOUNTER — Other Ambulatory Visit: Payer: Self-pay | Admitting: Internal Medicine

## 2023-01-08 DIAGNOSIS — I152 Hypertension secondary to endocrine disorders: Secondary | ICD-10-CM

## 2023-01-09 ENCOUNTER — Ambulatory Visit: Payer: Self-pay

## 2023-01-09 NOTE — Patient Outreach (Signed)
  Care Coordination   01/09/2023 Name: Logan Knight MRN: [id number] DOB: February 18, 1951   Care Coordination Outreach Attempts:  An unsuccessful telephone outreach was attempted for a scheduled appointment today.  Follow Up Plan:  Additional outreach attempts will be made to offer the patient care coordination information and services.   Encounter Outcome:  No Answer   Care Coordination Interventions:  No, not indicated    Delsa Sale, RN, BSN, CCM Care Management Coordinator [facility] Care Management  Direct Phone: [phone]

## 2023-01-09 NOTE — Telephone Encounter (Signed)
Requested Prescriptions  Pending Prescriptions Disp Refills   hydrochlorothiazide (HYDRODIURIL) 25 MG tablet [Pharmacy Med Name: HYDROCHLOROTHIAZIDE 25 MG Tablet] 90 tablet 1    Sig: TAKE 1 TABLET EVERY DAY     Cardiovascular: Diuretics - Thiazide Failed - 01/08/2023  5:08 AM      Failed - Cr in normal range and within 180 days    Creat  Date Value Ref Range Status  06/09/2016 1.29 (H) 0.70 - 1.25 mg/dL Final    Comment:      For patients > or = 72 years of age: The upper reference limit for Creatinine is approximately 13% higher for people identified as African-American.      Creatinine, Ser  Date Value Ref Range Status  12/05/2022 1.31 (H) 0.76 - 1.27 mg/dL Final         Passed - K in normal range and within 180 days    Potassium  Date Value Ref Range Status  12/05/2022 4.0 3.5 - 5.2 mmol/L Final         Passed - Na in normal range and within 180 days    Sodium  Date Value Ref Range Status  12/05/2022 144 134 - 144 mmol/L Final         Passed - Last BP in normal range    BP Readings from Last 1 Encounters:  12/20/22 113/70         Passed - Valid encounter within last 6 months    Recent Outpatient Visits           3 weeks ago Type 2 diabetes mellitus with microalbuminuria, without long-term current use of insulin [facility])   [place] [facility] [place], [place] L, RPH-CPP   1 month ago Type 2 diabetes mellitus with microalbuminuria, without long-term current use of insulin [place])   [place] [facility] Marcine Matar, MD   2 months ago Severe headache   [facility] Jonah Blue B, MD   6 months ago Type 2 diabetes mellitus with microalbuminuria, without long-term current use of insulin [facility])   [place] [facility] Jonah Blue B, MD   9 months ago Type 2 diabetes mellitus with microalbuminuria, without long-term current use of insulin [facility])    [place] [facility] [place], Marzella Schlein, [place]       Future Appointments             In 1 week Marcine Matar, MD [facility]   In 3 weeks Magnus Ivan, Vanita Panda, MD [facility] [place]   In 1 month Kathryne Hitch, MD [facility]

## 2023-01-13 ENCOUNTER — Telehealth: Payer: Self-pay | Admitting: *Deleted

## 2023-01-13 NOTE — Progress Notes (Unsigned)
  Care Coordination Note  01/13/2023 Name: LIBRADO CUAUTLE MRN: [id number] DOB: 1950/11/03  Logan Knight is a 72 y.o. year old male who is a primary care patient of Mucad, Meloni, MD and is actively engaged with the care management team. I reached out to Logan Knight by phone today to assist with re-scheduling an initial visit with the RN Case Manager  Follow up plan: Unsuccessful telephone outreach attempt made. A HIPAA compliant phone message was left for the patient providing contact information and requesting a return call.   [facility]  Care Coordination Care Guide  Direct Dial: [phone]

## 2023-01-16 ENCOUNTER — Ambulatory Visit: Payer: Medicare HMO | Attending: Internal Medicine | Admitting: Internal Medicine

## 2023-01-16 ENCOUNTER — Encounter: Payer: Self-pay | Admitting: Internal Medicine

## 2023-01-16 VITALS — BP 130/76 | HR 92 | Temp 98.0°F | Resp 16 | Ht 73.0 in | Wt 205.0 lb

## 2023-01-16 DIAGNOSIS — E1159 Type 2 diabetes mellitus with other circulatory complications: Secondary | ICD-10-CM | POA: Diagnosis not present

## 2023-01-16 DIAGNOSIS — I152 Hypertension secondary to endocrine disorders: Secondary | ICD-10-CM

## 2023-01-16 DIAGNOSIS — R809 Proteinuria, unspecified: Secondary | ICD-10-CM

## 2023-01-16 DIAGNOSIS — Z7984 Long term (current) use of oral hypoglycemic drugs: Secondary | ICD-10-CM

## 2023-01-16 DIAGNOSIS — R202 Paresthesia of skin: Secondary | ICD-10-CM

## 2023-01-16 DIAGNOSIS — E1129 Type 2 diabetes mellitus with other diabetic kidney complication: Secondary | ICD-10-CM | POA: Diagnosis not present

## 2023-01-16 NOTE — Progress Notes (Unsigned)
  Care Coordination Note  01/16/2023 Name: REZNOR BANKA MRN: [id number] DOB: Dec 18, 1950  Quillian Quince is a 72 y.o. year old male who is a primary care patient of Abhilash, Emens, MD and is actively engaged with the care management team. I reached out to Quillian Quince by phone today to assist with re-scheduling an initial visit with the RN Case Manager  Follow up plan: Unsuccessful telephone outreach attempt made. A HIPAA compliant phone message was left for the patient providing contact information and requesting a return call.   [facility]  Care Coordination Care Guide  Direct Dial: [phone]
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
## 2023-01-16 NOTE — Progress Notes (Signed)
Paraesthias in right arm throughout the right arm A1C on 11/27/22 was 7.9

## 2023-01-17 NOTE — Progress Notes (Signed)
  Care Coordination Note  01/17/2023 Name: Logan Knight MRN: [id number] DOB: 07/26/1951  Logan Knight is a 72 y.o. year old male who is a primary care patient of Nakhi, Hitsman, MD and is actively engaged with the care management team. I reached out to Logan Knight by phone today to assist with re-scheduling an initial visit with the RN Case Manager  Follow up plan: Unsuccessful telephone outreach attempt made. A HIPAA compliant phone message was left for the patient providing contact information and requesting a return call.  We have been unable to make contact with the patient for follow up. The care management team is available to follow up with the patient after provider conversation with the patient regarding recommendation for care management engagement and subsequent re-referral to the care management team.   [facility] Coordination Care Guide  Direct Dial: [phone]

## 2023-01-19 ENCOUNTER — Telehealth: Payer: Self-pay | Admitting: *Deleted

## 2023-01-19 NOTE — Progress Notes (Signed)
  Care Coordination Note  01/19/2023 Name: Logan Knight MRN: [id number] DOB: Mar 28, 1951  Logan Knight is a 72 y.o. year old male who is a primary care patient of Camdynn, Aldis, MD and is actively engaged with the care management team. I reached out to Logan Knight by phone today to assist with re-scheduling an initial visit with the RN Case Manager  Follow up plan: Telephone appointment with care management team member scheduled for:02/03/23  [facility] Coordination Care Guide  Direct Dial: [phone]

## 2023-01-24 ENCOUNTER — Other Ambulatory Visit: Payer: Self-pay | Admitting: Internal Medicine

## 2023-01-24 DIAGNOSIS — E1159 Type 2 diabetes mellitus with other circulatory complications: Secondary | ICD-10-CM

## 2023-01-26 ENCOUNTER — Other Ambulatory Visit: Payer: Self-pay

## 2023-01-26 NOTE — Patient Outreach (Signed)
Aging Gracefully Program  01/26/2023  Logan Knight [birth year] [id number]   [facility] Evaluation Interviewer attempted to call patient on today regarding Aging Gracefully referral. No answer from patient after multiple rings. CMA left confidential voicemail for patient to return call.  Will attempt to call back within 1 week.   Vanice Sarah Care Management Assistant [phone]

## 2023-01-30 ENCOUNTER — Ambulatory Visit: Payer: Medicare HMO | Admitting: Orthopaedic Surgery

## 2023-02-02 ENCOUNTER — Other Ambulatory Visit: Payer: Self-pay | Admitting: Internal Medicine

## 2023-02-02 DIAGNOSIS — E1169 Type 2 diabetes mellitus with other specified complication: Secondary | ICD-10-CM

## 2023-02-02 MED ORDER — ATORVASTATIN CALCIUM 40 MG PO TABS
40.0000 mg | ORAL_TABLET | Freq: Every day | ORAL | 1 refills | Status: DC
Start: 2023-02-02 — End: 2023-06-29

## 2023-02-02 NOTE — Telephone Encounter (Signed)
Medication Refill - Medication: atorvastatin (LIPITOR) 40 MG tablet   Has the patient contacted their pharmacy? Yes.      Preferred Pharmacy (with phone number or street name):  [facility] - [place], [place] - [address] Phone: [phone]  Fax: [phone]      Has the patient been seen for an appointment in the last year OR does the patient have an upcoming appointment? Yes.    Please assist patient further

## 2023-02-02 NOTE — Telephone Encounter (Signed)
Requested Prescriptions  Pending Prescriptions Disp Refills   atorvastatin (LIPITOR) 40 MG tablet 90 tablet 1    Sig: Take 1 tablet (40 mg total) by mouth daily.     Cardiovascular:  Antilipid - Statins Failed - 02/02/2023 11:30 AM      Failed - Lipid Panel in normal range within the last 12 months    Cholesterol, Total  Date Value Ref Range Status  07/05/2022 145 100 - 199 mg/dL Final   LDL Chol Calc (NIH)  Date Value Ref Range Status  07/05/2022 55 0 - 99 mg/dL Final   HDL  Date Value Ref Range Status  07/05/2022 34 (L) >39 mg/dL Final   Triglycerides  Date Value Ref Range Status  07/05/2022 366 (H) 0 - 149 mg/dL Final         Passed - Patient is not pregnant      Passed - Valid encounter within last 12 months    Recent Outpatient Visits           2 weeks ago Type 2 diabetes mellitus with microalbuminuria, without long-term current use of insulin (HCC)   [place] [facility] [place], Gavin Pound B, MD   1 month ago Type 2 diabetes mellitus with microalbuminuria, without long-term current use of insulin [facility])   [place] [facility] [place], [place] L, RPH-CPP   1 month ago Type 2 diabetes mellitus with microalbuminuria, without long-term current use of insulin [facility])   [place] [facility] Marcine Matar, MD   3 months ago Severe headache   [facility] Jonah Blue B, MD   7 months ago Type 2 diabetes mellitus with microalbuminuria, without long-term current use of insulin [facility])   [facility] Marcine Matar, MD       Future Appointments             In 1 month Magnus Ivan, Vanita Panda, MD [facility] [place]   In 3 months Marcine Matar, MD [facility]

## 2023-02-03 ENCOUNTER — Ambulatory Visit: Payer: Self-pay

## 2023-02-03 NOTE — Patient Outreach (Signed)
  Care Coordination   02/03/2023 Name: Logan Knight MRN: [id number] DOB: 09-08-1950   Care Coordination Outreach Attempts:  An unsuccessful telephone outreach was attempted for a scheduled appointment today.  Follow Up Plan:  Additional outreach attempts will be made to offer the patient care coordination information and services.   Encounter Outcome:  No Answer   Care Coordination Interventions:  No, not indicated    Delsa Sale, RN, BSN, CCM Care Management Coordinator [facility] Care Management  Direct Phone: [phone]

## 2023-02-07 ENCOUNTER — Other Ambulatory Visit: Payer: Self-pay | Admitting: Internal Medicine

## 2023-02-07 DIAGNOSIS — N529 Male erectile dysfunction, unspecified: Secondary | ICD-10-CM

## 2023-02-07 NOTE — Telephone Encounter (Signed)
Medication Refill - Medication: sildenafil (VIAGRA) 100 MG tablet   Has the patient contacted their pharmacy? No. (Agent: If no, request that the patient contact the pharmacy for the refill. If patient does not wish to contact the pharmacy document the reason why and proceed with request.) (Agent: If yes, when and what did the pharmacy advise?)  Preferred Pharmacy (with phone number or street name):  [id number] [place], [place] - [address] AT [facility] Phone: [phone]  Fax: [phone]     Has the patient been seen for an appointment in the last year OR does the patient have an upcoming appointment? Yes.    Agent: Please be advised that RX refills may take up to 3 business days. We ask that you follow-up with your pharmacy.

## 2023-02-08 MED ORDER — SILDENAFIL CITRATE 100 MG PO TABS
ORAL_TABLET | ORAL | 0 refills | Status: DC
Start: 2023-02-08 — End: 2023-02-28

## 2023-02-08 NOTE — Telephone Encounter (Signed)
Requested Prescriptions  Pending Prescriptions Disp Refills   sildenafil (VIAGRA) 100 MG tablet 90 tablet 0    Sig: TAKE 1/2 TO 1 TABLET AS NEEDED 1/2 HOUR PRIOR TO INTERCOURSE. LIMIT USE TO 1 TABLET PER 24 HOURS     Urology: Erectile Dysfunction Agents Passed - 02/07/2023  1:47 PM      Passed - AST in normal range and within 360 days    AST  Date Value Ref Range Status  11/28/2022 18 15 - 41 U/L Final         Passed - ALT in normal range and within 360 days    ALT  Date Value Ref Range Status  11/28/2022 20 0 - 44 U/L Final         Passed - Last BP in normal range    BP Readings from Last 1 Encounters:  01/16/23 130/76         Passed - Valid encounter within last 12 months    Recent Outpatient Visits           3 weeks ago Type 2 diabetes mellitus with microalbuminuria, without long-term current use of insulin (HCC)   [place] Jonah Blue B, MD   1 month ago Type 2 diabetes mellitus with microalbuminuria, without long-term current use of insulin [facility])   [place] [facility] [place] [facility] [place], [place] L, RPH-CPP   2 months ago Type 2 diabetes mellitus with microalbuminuria, without long-term current use of insulin [facility])   [place] [facility] Marcine Matar, MD   3 months ago Severe headache   [place] [facility] Jonah Blue B, MD   7 months ago Type 2 diabetes mellitus with microalbuminuria, without long-term current use of insulin [facility])    [facility] Marcine Matar, MD       Future Appointments             In 3 weeks Kathryne Hitch, MD [facility] [place]   In 3 months Marcine Matar, MD [facility]

## 2023-02-09 ENCOUNTER — Other Ambulatory Visit: Payer: Self-pay

## 2023-02-09 NOTE — Patient Outreach (Signed)
Aging Gracefully Program  02/09/2023  DANIE DIEHL 05/24/1951 [id number]   [facility] Evaluation Interviewer attempted to call patient on today regarding Aging Gracefully referral. Wife informed will need to call back to schedule a time to take evaluation. Will attempt to call back within 1 week.   Vanice Sarah Care Management Assistant [phone]

## 2023-02-24 ENCOUNTER — Other Ambulatory Visit: Payer: Self-pay | Admitting: Internal Medicine

## 2023-02-24 DIAGNOSIS — N529 Male erectile dysfunction, unspecified: Secondary | ICD-10-CM

## 2023-02-24 NOTE — Telephone Encounter (Signed)
Medication Refill - Medication: sildenafil (VIAGRA) 100 MG tablet   Has the patient contacted their pharmacy? No.  Preferred Pharmacy (with phone number or street name):  [facility] - [place], [place] - [address] Phone: [phone]  Fax: [phone]     Has the patient been seen for an appointment in the last year OR does the patient have an upcoming appointment? No.  Agent: Please be advised that RX refills may take up to 3 business days. We ask that you follow-up with your pharmacy.  Patient said he got his prescription from [facility] and it is not giving him the desired affect. The pills he get from [facility] work better

## 2023-02-27 NOTE — Telephone Encounter (Signed)
Rx 02/08/23 #90- too soon Requested Prescriptions  Pending Prescriptions Disp Refills   sildenafil (VIAGRA) 100 MG tablet 90 tablet 0    Sig: TAKE 1/2 TO 1 TABLET AS NEEDED 1/2 HOUR PRIOR TO INTERCOURSE. LIMIT USE TO 1 TABLET PER 24 HOURS     Urology: Erectile Dysfunction Agents Passed - 02/24/2023  1:38 PM      Passed - AST in normal range and within 360 days    AST  Date Value Ref Range Status  11/28/2022 18 15 - 41 U/L Final         Passed - ALT in normal range and within 360 days    ALT  Date Value Ref Range Status  11/28/2022 20 0 - 44 U/L Final         Passed - Last BP in normal range    BP Readings from Last 1 Encounters:  01/16/23 130/76         Passed - Valid encounter within last 12 months    Recent Outpatient Visits           1 month ago Type 2 diabetes mellitus with microalbuminuria, without long-term current use of insulin (HCC)   [place] [facility] Jonah Blue B, MD   2 months ago Type 2 diabetes mellitus with microalbuminuria, without long-term current use of insulin [facility])   [place] [facility] [place], [place] L, RPH-CPP   2 months ago Type 2 diabetes mellitus with microalbuminuria, without long-term current use of insulin [facility])   [place] [facility] Marcine Matar, MD   3 months ago Severe headache   [place] [facility] Jonah Blue B, MD   7 months ago Type 2 diabetes mellitus with microalbuminuria, without long-term current use of insulin [facility])    [facility] Marcine Matar, MD       Future Appointments             In 1 week Kathryne Hitch, MD [facility] [place]   In 2 months Marcine Matar, MD [facility]

## 2023-02-28 ENCOUNTER — Other Ambulatory Visit: Payer: Self-pay | Admitting: Internal Medicine

## 2023-02-28 ENCOUNTER — Telehealth: Payer: Self-pay | Admitting: *Deleted

## 2023-02-28 DIAGNOSIS — N529 Male erectile dysfunction, unspecified: Secondary | ICD-10-CM

## 2023-02-28 NOTE — Telephone Encounter (Signed)
Medication Refill - Medication: sildenafil (VIAGRA) 100 MG tablet   Has the patient contacted their pharmacy? No. Pt states that he did get the 90 day supply of medication from [facility] on 02/08/2023 but the medication does not have the same strength  as the ones that is sent from the mail delivery pharmacy [facility]. Pt states that the medication from [facility] does not work and would like a supply sent to his mail order pharmacy.     Preferred Pharmacy (with phone number or street name): [facility] - [place], [place] - [address]  Phone: [phone] Fax: [phone]  Has the patient been seen for an appointment in the last year OR does the patient have an upcoming appointment? Yes.    Agent: Please be advised that RX refills may take up to 3 business days. We ask that you follow-up with your pharmacy.

## 2023-02-28 NOTE — Progress Notes (Signed)
  Care Coordination Note  02/28/2023 Name: Logan Knight MRN: [id number] DOB: 23-Nov-1950  Logan Knight is a 72 y.o. year old male who is a primary care patient of Elio, Gillen[place], MD and is actively engaged with the care management team. I reached out to Logan Knight by phone today to assist with re-scheduling an initial visit with the RN Case Manager  Follow up plan: Unsuccessful telephone outreach attempt made. A HIPAA compliant phone message was left for the patient providing contact information and requesting a return call.   [facility]  Care Coordination Care Guide  Direct Dial: [phone]

## 2023-03-01 MED ORDER — SILDENAFIL CITRATE 100 MG PO TABS
ORAL_TABLET | ORAL | 2 refills | Status: DC
Start: 2023-03-01 — End: 2023-03-21

## 2023-03-01 NOTE — Telephone Encounter (Signed)
Requested medication (s) are due for refill today: No  Requested medication (s) are on the active medication list: Yes  Last refill:  6/08/30/22  Future visit scheduled: Yes  Notes to clinic:  Request is from pt.'s mail order pharmacy. Last refill went to local pharmacy. Unable to reach pt. To verify.    Requested Prescriptions  Pending Prescriptions Disp Refills   sildenafil (VIAGRA) 100 MG tablet 90 tablet 0    Sig: TAKE 1/2 TO 1 TABLET AS NEEDED 1/2 HOUR PRIOR TO INTERCOURSE. LIMIT USE TO 1 TABLET PER 24 HOURS     Urology: Erectile Dysfunction Agents Passed - 02/28/2023 11:20 AM      Passed - AST in normal range and within 360 days    AST  Date Value Ref Range Status  11/28/2022 18 15 - 41 U/L Final         Passed - ALT in normal range and within 360 days    ALT  Date Value Ref Range Status  11/28/2022 20 0 - 44 U/L Final         Passed - Last BP in normal range    BP Readings from Last 1 Encounters:  01/16/23 130/76         Passed - Valid encounter within last 12 months    Recent Outpatient Visits           1 month ago Type 2 diabetes mellitus with microalbuminuria, without long-term current use of insulin (HCC)   [place] [facility] Logan Blue B, MD   2 months ago Type 2 diabetes mellitus with microalbuminuria, without long-term current use of insulin [facility])   [place] [facility] [place], [place] L, RPH-CPP   2 months ago Type 2 diabetes mellitus with microalbuminuria, without long-term current use of insulin [facility])   [place] [facility] Logan Matar, MD   3 months ago Severe headache   [place] [facility] Logan Blue B, MD   7 months ago Type 2 diabetes mellitus with microalbuminuria, without long-term current use of insulin [facility])   [place] [facility] Logan Matar, MD       Future Appointments              In 5 days Kathryne Hitch, MD [facility] [place]   In 2 months Logan Matar, MD [facility]

## 2023-03-03 NOTE — Progress Notes (Signed)
  Care Coordination Note  03/03/2023 Name: [name] MRN: [id number] DOB: 26-Sep-1950  Logan Knight is a 72 y.o. year old male who is a primary care patient of Brahm, Horman, MD and is actively engaged with the care management team. I reached out to Logan Knight by phone today to assist with re-scheduling an initial visit with the RN Case Manager  Follow up plan: Unsuccessful telephone outreach attempt made. A HIPAA compliant phone message was left for the patient providing contact information and requesting a return call.  We have been unable to make contact with the patient for follow up. The care management team is available to follow up with the patient after provider conversation with the patient regarding recommendation for care management engagement and subsequent re-referral to the care management team.   [facility] Coordination Care Guide  Direct Dial: [phone]

## 2023-03-06 ENCOUNTER — Ambulatory Visit: Payer: Medicare HMO | Admitting: Orthopaedic Surgery

## 2023-03-21 ENCOUNTER — Other Ambulatory Visit: Payer: Self-pay | Admitting: Physician Assistant

## 2023-03-21 ENCOUNTER — Other Ambulatory Visit: Payer: Self-pay | Admitting: Internal Medicine

## 2023-03-21 DIAGNOSIS — I10 Essential (primary) hypertension: Secondary | ICD-10-CM

## 2023-03-21 DIAGNOSIS — N529 Male erectile dysfunction, unspecified: Secondary | ICD-10-CM

## 2023-03-21 DIAGNOSIS — E1129 Type 2 diabetes mellitus with other diabetic kidney complication: Secondary | ICD-10-CM

## 2023-03-21 DIAGNOSIS — N400 Enlarged prostate without lower urinary tract symptoms: Secondary | ICD-10-CM

## 2023-03-22 ENCOUNTER — Other Ambulatory Visit: Payer: Self-pay | Admitting: Internal Medicine

## 2023-03-22 DIAGNOSIS — I10 Essential (primary) hypertension: Secondary | ICD-10-CM

## 2023-03-22 MED ORDER — AMLODIPINE BESYLATE 10 MG PO TABS: 10.0000 mg | ORAL_TABLET | Freq: Every day | ORAL | 1 refills | Status: DC

## 2023-05-02 ENCOUNTER — Ambulatory Visit: Payer: Medicare HMO | Attending: Internal Medicine

## 2023-05-16 ENCOUNTER — Other Ambulatory Visit: Payer: Self-pay | Admitting: Internal Medicine

## 2023-05-16 ENCOUNTER — Ambulatory Visit: Payer: Medicare HMO | Attending: Internal Medicine

## 2023-05-16 VITALS — Ht 73.0 in | Wt 205.0 lb

## 2023-05-16 DIAGNOSIS — Z0000 Encounter for general adult medical examination without abnormal findings: Secondary | ICD-10-CM

## 2023-05-16 DIAGNOSIS — E1129 Type 2 diabetes mellitus with other diabetic kidney complication: Secondary | ICD-10-CM

## 2023-05-16 NOTE — Patient Instructions (Signed)
Mr. Franta , Thank you for taking time to come for your Medicare Wellness Visit. I appreciate your ongoing commitment to your health goals. Please review the following plan we discussed and let me know if I can assist you in the future.   Referrals/Orders/Follow-Ups/Clinician Recommendations: Aim for 30 minutes of exercise or brisk walking, 6-8 glasses of water, and 5 servings of fruits and vegetables each day.  This is a list of the screening recommended for you and due dates:  Health Maintenance  Topic Date Due   Flu Shot  03/30/2023   COVID-19 Vaccine (3 - 2023-24 season) 04/30/2023   Hemoglobin A1C  05/29/2023   Eye exam for diabetics  09/01/2023   Yearly kidney function blood test for diabetes  12/05/2023   Yearly kidney health urinalysis for diabetes  12/05/2023   Complete foot exam   12/05/2023   Medicare Annual Wellness Visit  05/15/2024   Colon Cancer Screening  10/23/2025   DTaP/Tdap/Td vaccine (3 - Td or Tdap) 02/16/2030   Pneumonia Vaccine  Completed   Hepatitis C Screening  Completed   Zoster (Shingles) Vaccine  Completed   HPV Vaccine  Aged Out    Advanced directives: (ACP Link)Information on Advanced Care Planning can be found at [facility] [place] Advance Health Care Directives Advance Health Care Directives ([email or web address]   Next Medicare Annual Wellness Visit scheduled for next year: Yes

## 2023-05-16 NOTE — Telephone Encounter (Signed)
Medication Refill - Medication: Test Strips   Has the patient contacted their pharmacy? No. (Agent: If no, request that the patient contact the pharmacy for the refill. If patient does not wish to contact the pharmacy document the reason why and proceed with request.)   Preferred Pharmacy (with phone number or street name):  [facility] - [place], [place] - [address]  [address] [place] [address]  Phone: [phone] Fax: [phone]  Hours: Not open 24 hours   Has the patient been seen for an appointment in the last year OR does the patient have an upcoming appointment? Yes.    Agent: Please be advised that RX refills may take up to 3 business days. We ask that you follow-up with your pharmacy.
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
## 2023-05-17 MED ORDER — TRUE METRIX BLOOD GLUCOSE TEST VI STRP
ORAL_STRIP | 2 refills | Status: DC
Start: 2023-05-17 — End: 2024-05-16

## 2023-05-17 NOTE — Telephone Encounter (Signed)
Requested Prescriptions  Pending Prescriptions Disp Refills   glucose blood (TRUE METRIX BLOOD GLUCOSE TEST) test strip 100 each 2    Sig: Check blood sugar once daily. Dx: E11.29     Endocrinology: Diabetes - Testing Supplies Passed - 05/16/2023  9:57 AM      Passed - Valid encounter within last 12 months    Recent Outpatient Visits           4 months ago Type 2 diabetes mellitus with microalbuminuria, without long-term current use of insulin (HCC)   [place] [facility] Jonah Blue B, MD   5 months ago Type 2 diabetes mellitus with microalbuminuria, without long-term current use of insulin [facility])   [place] [facility] Brock, [place] L, RPH-CPP   5 months ago Type 2 diabetes mellitus with microalbuminuria, without long-term current use of insulin [facility])   [place] [facility] [place] [facility] Marcine Matar, MD   6 months ago Severe headache   [place] [facility] Jonah Blue B, MD   10 months ago Type 2 diabetes mellitus with microalbuminuria, without long-term current use of insulin [facility])   [place] [facility] Marcine Matar, MD       Future Appointments             In 2 days Marcine Matar, MD [facility]

## 2023-05-19 ENCOUNTER — Ambulatory Visit: Payer: Medicare HMO | Admitting: Internal Medicine

## 2023-05-26 DIAGNOSIS — R972 Elevated prostate specific antigen [PSA]: Secondary | ICD-10-CM | POA: Diagnosis not present

## 2023-06-17 ENCOUNTER — Other Ambulatory Visit: Payer: Self-pay | Admitting: Internal Medicine

## 2023-06-17 DIAGNOSIS — N529 Male erectile dysfunction, unspecified: Secondary | ICD-10-CM

## 2023-06-17 DIAGNOSIS — E1159 Type 2 diabetes mellitus with other circulatory complications: Secondary | ICD-10-CM

## 2023-06-19 NOTE — Telephone Encounter (Signed)
Requested Prescriptions  Pending Prescriptions Disp Refills   sildenafil (VIAGRA) 100 MG tablet [Pharmacy Med Name: SILDENAFIL 100MG  TABLETS] 90 tablet 1    Sig: TAKE 1/2 TO 1 TABLET AS NEEDED 30 MINS PRIOR TO INTERCOURSE. LIMIT USE TO 1 TABLET PER 24 HOURS     Urology: Erectile Dysfunction Agents Passed - 06/17/2023  4:31 PM      Passed - AST in normal range and within 360 days    AST  Date Value Ref Range Status  11/28/2022 18 15 - 41 U/L Final         Passed - ALT in normal range and within 360 days    ALT  Date Value Ref Range Status  11/28/2022 20 0 - 44 U/L Final         Passed - Last BP in normal range    BP Readings from Last 1 Encounters:  01/16/23 130/76         Passed - Valid encounter within last 12 months    Recent Outpatient Visits           5 months ago Type 2 diabetes mellitus with microalbuminuria, without long-term current use of insulin (HCC)   [facility] Jonah Blue B, MD   6 months ago Type 2 diabetes mellitus with microalbuminuria, without long-term current use of insulin [facility])   [place] [facility] [place], [place] L, RPH-CPP   6 months ago Type 2 diabetes mellitus with microalbuminuria, without long-term current use of insulin [facility])   [place] [facility] Marcine Matar, MD   7 months ago Severe headache   [facility] Jonah Blue B, MD   11 months ago Type 2 diabetes mellitus with microalbuminuria, without long-term current use of insulin [facility])   [place] [facility] Marcine Matar, MD       Future Appointments             In 4 weeks Marcine Matar, MD [facility]

## 2023-06-29 ENCOUNTER — Other Ambulatory Visit: Payer: Self-pay | Admitting: Internal Medicine

## 2023-06-29 DIAGNOSIS — E1169 Type 2 diabetes mellitus with other specified complication: Secondary | ICD-10-CM

## 2023-07-18 ENCOUNTER — Encounter: Payer: Self-pay | Admitting: Internal Medicine

## 2023-07-18 ENCOUNTER — Ambulatory Visit: Payer: Medicare HMO | Attending: Internal Medicine | Admitting: Internal Medicine

## 2023-07-18 VITALS — BP 147/83 | HR 89 | Temp 98.1°F | Ht 73.2 in | Wt 206.0 lb

## 2023-07-18 DIAGNOSIS — E1159 Type 2 diabetes mellitus with other circulatory complications: Secondary | ICD-10-CM | POA: Diagnosis not present

## 2023-07-18 DIAGNOSIS — E1129 Type 2 diabetes mellitus with other diabetic kidney complication: Secondary | ICD-10-CM

## 2023-07-18 DIAGNOSIS — R809 Proteinuria, unspecified: Secondary | ICD-10-CM | POA: Diagnosis not present

## 2023-07-18 DIAGNOSIS — E1169 Type 2 diabetes mellitus with other specified complication: Secondary | ICD-10-CM

## 2023-07-18 DIAGNOSIS — E119 Type 2 diabetes mellitus without complications: Secondary | ICD-10-CM

## 2023-07-18 DIAGNOSIS — Z7984 Long term (current) use of oral hypoglycemic drugs: Secondary | ICD-10-CM

## 2023-07-18 DIAGNOSIS — I152 Hypertension secondary to endocrine disorders: Secondary | ICD-10-CM | POA: Diagnosis not present

## 2023-07-18 DIAGNOSIS — E785 Hyperlipidemia, unspecified: Secondary | ICD-10-CM | POA: Diagnosis not present

## 2023-07-18 LAB — POCT GLYCOSYLATED HEMOGLOBIN (HGB A1C): HbA1c, POC (controlled diabetic range): 6.5 % (ref 0.0–7.0)

## 2023-07-18 LAB — GLUCOSE, POCT (MANUAL RESULT ENTRY): POC Glucose: 114 mg/dL — AB (ref 70–99)

## 2023-07-18 MED ORDER — HYDROCHLOROTHIAZIDE 25 MG PO TABS
25.0000 mg | ORAL_TABLET | Freq: Every day | ORAL | 0 refills | Status: DC
Start: 2023-07-18 — End: 2023-07-19
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
## 2023-07-19 ENCOUNTER — Telehealth: Payer: Self-pay | Admitting: Internal Medicine

## 2023-07-19 ENCOUNTER — Encounter: Payer: Self-pay | Admitting: Internal Medicine

## 2023-07-19 DIAGNOSIS — R351 Nocturia: Secondary | ICD-10-CM | POA: Diagnosis not present

## 2023-07-19 DIAGNOSIS — R972 Elevated prostate specific antigen [PSA]: Secondary | ICD-10-CM | POA: Diagnosis not present

## 2023-07-19 DIAGNOSIS — N401 Enlarged prostate with lower urinary tract symptoms: Secondary | ICD-10-CM | POA: Diagnosis not present

## 2023-07-19 DIAGNOSIS — R3915 Urgency of urination: Secondary | ICD-10-CM | POA: Diagnosis not present

## 2023-07-19 MED ORDER — HYDROCHLOROTHIAZIDE 25 MG PO TABS: 25.0000 mg | ORAL_TABLET | Freq: Every day | ORAL | 1 refills | Status: DC

## 2023-07-19 MED ORDER — METFORMIN HCL 1000 MG PO TABS: 1000.0000 mg | ORAL_TABLET | Freq: Two times a day (BID) | ORAL | 1 refills | Status: DC

## 2023-07-19 MED ORDER — CARVEDILOL 25 MG PO TABS: 25.0000 mg | ORAL_TABLET | Freq: Two times a day (BID) | ORAL | 1 refills | Status: DC

## 2023-07-19 NOTE — Telephone Encounter (Signed)
Yes ma'am! 

## 2023-07-19 NOTE — Telephone Encounter (Signed)
Please advise. From my understanding patient was advised to drop-off FMLA documentation.

## 2023-07-19 NOTE — Telephone Encounter (Signed)
Called patient but no answer. LVM to call back.

## 2023-07-19 NOTE — Telephone Encounter (Signed)
Error

## 2023-07-19 NOTE — Telephone Encounter (Signed)
I am not going to call FML[employer].  It is pt's responsibility to get the form to me or have his employer fax form to me.

## 2023-07-19 NOTE — Telephone Encounter (Signed)
Copied from CRM [id number]. Topic: General - Inquiry >> Jul 19, 2023 11:36 AM De Blanch wrote: Reason for CRM:Pt stated that when he came in to see Dr. Laural Benes, he told her that he accumulates points when he has to leave work early to come to doctors' appointments.  He was advised by his employer that his doctor needs to call [employer].  Call - [phone]  Please advice.

## 2023-07-20 NOTE — Telephone Encounter (Signed)
Called but no answer. LVM to call back.  

## 2023-07-21 NOTE — Telephone Encounter (Signed)
FYI. Called but no answer. LVM to contact [employer] first and have them send [facility] the [facility] paperwork.

## 2023-07-21 NOTE — Telephone Encounter (Signed)
The patient called back stating he has tried multiple to try and call [employer] and been on hold for long period of time. He will keep trying. Please assist patient further

## 2023-07-21 NOTE — Telephone Encounter (Signed)
Patient called back and stated that he gave [employer] Dr Henriette Combs fax number [phone]) and stated Dr Laural Benes should be receiving paperwork from [facility] in the next 48 hours. Patient is requesting a call when this documentation is received.   Patient's callback # [phone]

## 2023-07-21 NOTE — Telephone Encounter (Signed)
Noted. Awaiting FMLA forms. Will call patient when forms are completed.

## 2023-07-21 NOTE — Telephone Encounter (Signed)
noted 

## 2023-07-21 NOTE — Telephone Encounter (Signed)
Noted  

## 2023-07-24 ENCOUNTER — Other Ambulatory Visit: Payer: Medicare HMO

## 2023-08-01 ENCOUNTER — Other Ambulatory Visit (HOSPITAL_BASED_OUTPATIENT_CLINIC_OR_DEPARTMENT_OTHER): Payer: Medicare HMO | Admitting: Pharmacist

## 2023-08-01 DIAGNOSIS — E1169 Type 2 diabetes mellitus with other specified complication: Secondary | ICD-10-CM

## 2023-08-01 NOTE — Progress Notes (Signed)
Pharmacy Quality Measure Review  This patient is appearing on a report for being at risk of failing the adherence measure for cholesterol (statin) medications this calendar year.   Medication: atorvastatin Last fill date: 06/29/2023 for 90 day supply. Of note, LF date at the time this report was ran was 02/03/23 with an absolute fail date of 08/12/2023. Rxn was filled regularly for 90-ds on 04/17/23 and 06/29/23. Therefore, risk of failure is now minimal.   Insurance report was not up to date. No action needed at this time.   Butch Penny, PharmD, P[place]y [place], CPP Clinical Pharmacist [facility] [phone]

## 2023-08-07 ENCOUNTER — Other Ambulatory Visit: Payer: Self-pay

## 2023-08-07 NOTE — Patient Outreach (Signed)
Aging Gracefully Program  08/07/2023  Logan Knight 03-19-1951 [id number]   [facility] Evaluation Interviewer made contact with patient. Aging Gracefully 5 Month survey completed.   Vanice Sarah Care Management Assistant [phone]

## 2023-08-09 NOTE — Telephone Encounter (Signed)
Pt called in for status of FMLA paperwork. Please cb

## 2023-08-11 ENCOUNTER — Telehealth: Payer: Self-pay

## 2023-08-11 NOTE — Telephone Encounter (Signed)
Duplicate

## 2023-08-11 NOTE — Telephone Encounter (Signed)
Copied from CRM [id number]. Topic: General - Other >> Aug 10, 2023  1:26 PM Phill Myron wrote: Pt Ruland calling to see if FMLA paper work was received. Please advise.Marland Kitchen

## 2023-08-16 NOTE — Telephone Encounter (Signed)
See pt message. I completed his form 2-3 wks ago.

## 2023-08-17 NOTE — Telephone Encounter (Signed)
Paperwork previosuly received was the form that needed to be completed by the patient. No FMLA paperwork was completed at that time. FMLA paperwork was received by [employer] yesterday 08/16/2023. Informed & clarified with PCP & now aware.   Called patient & LVM infoming that FMLA form was received yesterday 08/16/2023. Form should be completed by next Thursday 08/24/23.

## 2023-08-31 NOTE — Telephone Encounter (Signed)
 Completed FMLA form received and successfully faxed back to [employer] on 08/31/23. Called & LVM informing patient that forms have been sent back.

## 2023-10-22 ENCOUNTER — Other Ambulatory Visit: Payer: Self-pay | Admitting: Internal Medicine

## 2023-10-22 DIAGNOSIS — I10 Essential (primary) hypertension: Secondary | ICD-10-CM

## 2023-12-25 ENCOUNTER — Ambulatory Visit (INDEPENDENT_AMBULATORY_CARE_PROVIDER_SITE_OTHER): Admitting: Orthopaedic Surgery

## 2023-12-25 ENCOUNTER — Telehealth: Payer: Self-pay

## 2023-12-25 ENCOUNTER — Other Ambulatory Visit: Payer: Self-pay | Admitting: Orthopaedic Surgery

## 2023-12-25 ENCOUNTER — Telehealth: Payer: Self-pay | Admitting: Orthopaedic Surgery

## 2023-12-25 DIAGNOSIS — G8929 Other chronic pain: Secondary | ICD-10-CM

## 2023-12-25 DIAGNOSIS — M1712 Unilateral primary osteoarthritis, left knee: Secondary | ICD-10-CM | POA: Diagnosis not present

## 2023-12-25 DIAGNOSIS — M25562 Pain in left knee: Secondary | ICD-10-CM

## 2023-12-25 MED ORDER — LIDOCAINE HCL 1 % IJ SOLN
3.0000 mL | INTRAMUSCULAR | Status: AC | PRN
  Administered 2023-12-25: 3 mL

## 2023-12-25 MED ORDER — HYDROCODONE-ACETAMINOPHEN 5-325 MG PO TABS
1.0000 | ORAL_TABLET | Freq: Four times a day (QID) | ORAL | 0 refills | Status: DC | PRN
Start: 2023-12-25 — End: 2024-05-06

## 2023-12-25 MED ORDER — METHYLPREDNISOLONE ACETATE 40 MG/ML IJ SUSP
40.0000 mg | INTRAMUSCULAR | Status: AC | PRN
  Administered 2023-12-25: 40 mg via INTRA_ARTICULAR

## 2023-12-25 NOTE — Telephone Encounter (Signed)
 Patient aware of the below message but states he can't be out of work due to point system

## 2023-12-25 NOTE — Telephone Encounter (Signed)
 Pt called stating he had an injection thi mooring and his knee is in worse pain. Pt states he has t go to work tomorrow and he can't miss anymore days. Please send pain medication to [facility]. Please call pt at [phone].

## 2023-12-25 NOTE — Telephone Encounter (Signed)
 Left knee gel injection ?

## 2023-12-25 NOTE — Progress Notes (Signed)
 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  He last had a hyaluronic acid injection in his knee about a year ago.  He says that has helped somewhat.  He is requesting considering that type of injection again.  He does need something for the short-term to temporize his pain so we did talk about a steroid injection today.  He does wear knee sleeve.  He has had no acute changes in medical status.  He reports arthritic type pain with his left knee.  Examination of his left knee shows no effusion today.  There is varus malalignment that is correctable with medial joint line tenderness and patellofemoral crepitation.  I did place a steroid injection in his left knee today.  Will work on ordering hyaluronic acid to treat the long-term pain from osteoarthritis since this has helped in the past.  The other treatment option would be to consider knee replacement surgery but he still not ready for that he states.  This patient is diagnosed with osteoarthritis of the knee(s).    Radiographs show evidence of joint space narrowing, osteophytes, subchondral sclerosis and/or subchondral cysts.  This patient has knee pain which interferes with functional and activities of daily living.    This patient has experienced inadequate response, adverse effects and/or intolerance with conservative treatments such as acetaminophen , NSAIDS, topical creams, physical therapy or regular exercise, knee bracing and/or weight loss.   This patient has experienced inadequate response or has a contraindication to intra articular steroid injections for at least 3 months.   This patient is not scheduled to have a total knee replacement within 6 months of starting treatment with viscosupplementation.     Procedure Note  Patient: Logan Knight             Date of Birth: 06/05/1951           MRN: [id number]             Visit Date: 12/25/2023  Procedures: Visit  Diagnoses:  1. Unilateral primary osteoarthritis, left knee   2. Chronic pain of left knee     Large Joint Inj: L knee on 12/25/2023 8:32 AM Indications: diagnostic evaluation and pain Details: 22 G 1.5 in needle, superolateral approach  Arthrogram: No  Medications: 3 mL lidocaine  1 %; 40 mg methylPREDNISolone  acetate 40 MG/ML Outcome: tolerated well, no immediate complications Procedure, treatment alternatives, risks and benefits explained, specific risks discussed. Consent was given by the patient. Immediately prior to procedure a time out was called to verify the correct patient, procedure, equipment, support staff and site/side marked as required. Patient was prepped and draped in the usual sterile fashion.

## 2024-01-02 DIAGNOSIS — H31013 Macula scars of posterior pole (postinflammatory) (post-traumatic), bilateral: Secondary | ICD-10-CM | POA: Diagnosis not present

## 2024-01-02 DIAGNOSIS — Z961 Presence of intraocular lens: Secondary | ICD-10-CM | POA: Diagnosis not present

## 2024-01-02 DIAGNOSIS — H2512 Age-related nuclear cataract, left eye: Secondary | ICD-10-CM | POA: Diagnosis not present

## 2024-01-02 DIAGNOSIS — H35033 Hypertensive retinopathy, bilateral: Secondary | ICD-10-CM | POA: Diagnosis not present

## 2024-01-02 DIAGNOSIS — E119 Type 2 diabetes mellitus without complications: Secondary | ICD-10-CM | POA: Diagnosis not present

## 2024-01-02 LAB — HM DIABETES EYE EXAM

## 2024-01-03 NOTE — Telephone Encounter (Signed)
 VOB submitted for Monovisc, left knee

## 2024-01-04 ENCOUNTER — Ambulatory Visit: Attending: Internal Medicine | Admitting: Internal Medicine

## 2024-01-04 ENCOUNTER — Encounter: Payer: Self-pay | Admitting: Internal Medicine

## 2024-01-04 VITALS — BP 103/62 | HR 80 | Ht 73.0 in | Wt 204.0 lb

## 2024-01-04 DIAGNOSIS — E1169 Type 2 diabetes mellitus with other specified complication: Secondary | ICD-10-CM | POA: Diagnosis not present

## 2024-01-04 DIAGNOSIS — E1159 Type 2 diabetes mellitus with other circulatory complications: Secondary | ICD-10-CM

## 2024-01-04 DIAGNOSIS — I10 Essential (primary) hypertension: Secondary | ICD-10-CM | POA: Diagnosis not present

## 2024-01-04 DIAGNOSIS — Z7984 Long term (current) use of oral hypoglycemic drugs: Secondary | ICD-10-CM

## 2024-01-04 DIAGNOSIS — R0781 Pleurodynia: Secondary | ICD-10-CM

## 2024-01-04 DIAGNOSIS — N529 Male erectile dysfunction, unspecified: Secondary | ICD-10-CM

## 2024-01-04 DIAGNOSIS — Z1331 Encounter for screening for depression: Secondary | ICD-10-CM

## 2024-01-04 DIAGNOSIS — E119 Type 2 diabetes mellitus without complications: Secondary | ICD-10-CM

## 2024-01-04 DIAGNOSIS — L84 Corns and callosities: Secondary | ICD-10-CM | POA: Diagnosis not present

## 2024-01-04 DIAGNOSIS — I152 Hypertension secondary to endocrine disorders: Secondary | ICD-10-CM

## 2024-01-04 DIAGNOSIS — N401 Enlarged prostate with lower urinary tract symptoms: Secondary | ICD-10-CM

## 2024-01-04 DIAGNOSIS — E785 Hyperlipidemia, unspecified: Secondary | ICD-10-CM | POA: Diagnosis not present

## 2024-01-04 DIAGNOSIS — M1712 Unilateral primary osteoarthritis, left knee: Secondary | ICD-10-CM

## 2024-01-04 LAB — POCT GLYCOSYLATED HEMOGLOBIN (HGB A1C): HbA1c, POC (controlled diabetic range): 7.1 % — AB (ref 0.0–7.0)

## 2024-01-04 LAB — GLUCOSE, POCT (MANUAL RESULT ENTRY): POC Glucose: 234 mg/dL — AB (ref 70–99)

## 2024-01-04 MED ORDER — SILDENAFIL CITRATE 100 MG PO TABS: ORAL_TABLET | ORAL | 1 refills | Status: DC

## 2024-01-04 MED ORDER — CARVEDILOL 25 MG PO TABS: 25.0000 mg | ORAL_TABLET | Freq: Two times a day (BID) | ORAL | 1 refills | Status: DC

## 2024-01-04 MED ORDER — AMLODIPINE BESYLATE 10 MG PO TABS: 10.0000 mg | ORAL_TABLET | Freq: Every day | ORAL | 3 refills | Status: AC

## 2024-01-04 MED ORDER — HYDROCHLOROTHIAZIDE 25 MG PO TABS: 25.0000 mg | ORAL_TABLET | Freq: Every day | ORAL | 1 refills | Status: DC

## 2024-01-04 MED ORDER — FINASTERIDE 5 MG PO TABS: 5.0000 mg | ORAL_TABLET | Freq: Every day | ORAL | 1 refills | Status: AC

## 2024-01-04 NOTE — Patient Instructions (Signed)
 VISIT SUMMARY:  Today, we discussed the management of your diabetes, knee arthritis, and other chronic conditions. We reviewed your recent blood sugar levels and A1c, and talked about your knee pain and other symptoms. We also addressed your medications and any new symptoms you have been experiencing.  YOUR PLAN:  -TYPE 2 DIABETES MELLITUS WITH HYPERGLYCEMIA: Your blood sugar levels and A1c have increased, indicating that your diabetes is not as well controlled as it should be. We discussed making dietary changes, such as reducing high carbohydrate and sugar intake, substituting lean meats for pork and red meat, and increasing whole grains, fruits, and vegetables. You should also check your blood glucose levels before meals and consider drinking zero-calorie flavored water.  -HYPERTENSION: Your blood pressure is well-controlled with your current medications, so no changes are needed at this time.  -HYPERLIPIDEMIA: Your cholesterol levels are being managed with atorvastatin , and no changes are needed at this time.  -OSTEOARTHRITIS OF LEFT KNEE: You have persistent knee pain despite previous treatments. We will continue with hydrocodone  for short-term pain management and await the hyaluronic acid injection ordered by orthopedics.  -BENIGN PROSTATIC HYPERPLASIA WITH LOWER URINARY TRACT SYMPTOMS: This condition involves an enlarged prostate that can cause urinary symptoms. You should continue taking finasteride and tamsulosin  as prescribed. We have refilled your finasteride prescription.  -ERECTILE DYSFUNCTION: This condition is being managed with sildenafil , which is effective for you. We have refilled your sildenafil  prescription.  -INTERMITTENT LEFT FLANK PAIN: You have been experiencing sharp pain in your left rib cage area, likely due to a musculoskeletal strain. Monitor your symptoms for two more weeks. If the pain becomes more frequent or lasts longer, we may need to order a rib cage  x-ray.  -CALLUS FORMATION ON FEET: You have calluses on your feet, which can be common in people with diabetes. We recommend seeing a podiatrist for diabetic foot care.  INSTRUCTIONS:  Please follow up with the podiatrist for your foot care. Monitor your left flank pain for two more weeks and let us  know if it becomes more frequent or lasts longer. Continue with your current medications and dietary changes as discussed. Check your blood glucose levels before meals and consider drinking zero-calorie flavored water.
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
## 2024-01-07 LAB — MICROALBUMIN / CREATININE URINE RATIO
Creatinine, Urine: 201.1 mg/dL
Microalb/Creat Ratio: 39 mg/g{creat} — ABNORMAL HIGH (ref 0–29)
Microalbumin, Urine: 78 ug/mL

## 2024-01-07 LAB — CBC
Hematocrit: 41.1 % (ref 37.5–51.0)
Hemoglobin: 13.3 g/dL (ref 13.0–17.7)
MCH: 30 pg (ref 26.6–33.0)
MCHC: 32.4 g/dL (ref 31.5–35.7)
MCV: 93 fL (ref 79–97)
Platelets: 200 10*3/uL (ref 150–450)
RBC: 4.44 x10E6/uL (ref 4.14–5.80)
RDW: 12.8 % (ref 11.6–15.4)
WBC: 7.6 10*3/uL (ref 3.4–10.8)

## 2024-01-07 LAB — LIPID PANEL
Chol/HDL Ratio: 3.8 ratio (ref 0.0–5.0)
Cholesterol, Total: 152 mg/dL (ref 100–199)
HDL: 40 mg/dL (ref >39)
LDL Chol Calc (NIH): 76 mg/dL (ref 0–99)
Triglycerides: 213 mg/dL — ABNORMAL HIGH (ref 0–149)
VLDL Cholesterol Cal: 36 mg/dL (ref 5–40)

## 2024-01-07 LAB — COMPREHENSIVE METABOLIC PANEL WITH GFR
ALT: 20 IU/L (ref 0–44)
AST: 25 IU/L (ref 0–40)
Albumin: 4.3 g/dL (ref 3.8–4.8)
Alkaline Phosphatase: 65 IU/L (ref 44–121)
BUN/Creatinine Ratio: 15 (ref 10–24)
BUN: 17 mg/dL (ref 8–27)
Bilirubin Total: 0.5 mg/dL (ref 0.0–1.2)
CO2: 20 mmol/L (ref 20–29)
Calcium: 9.5 mg/dL (ref 8.6–10.2)
Chloride: 101 mmol/L (ref 96–106)
Creatinine, Ser: 1.17 mg/dL (ref 0.76–1.27)
Globulin, Total: 2.9 g/dL (ref 1.5–4.5)
Glucose: 236 mg/dL — ABNORMAL HIGH (ref 70–99)
Potassium: 3.6 mmol/L (ref 3.5–5.2)
Sodium: 140 mmol/L (ref 134–144)
Total Protein: 7.2 g/dL (ref 6.0–8.5)
eGFR: 66 mL/min/{1.73_m2} (ref >59)

## 2024-01-10 ENCOUNTER — Ambulatory Visit: Payer: Self-pay

## 2024-01-10 NOTE — Telephone Encounter (Signed)
 Copied from CRM [id number]. Topic: General - Other >> Jan 10, 2024  1:27 PM Kevelyn M wrote:  Reason for CRM: Patient is returning Clarisa's call

## 2024-01-10 NOTE — Telephone Encounter (Signed)
 Copied from CRM [id number]. Topic: General - Other >> Jan 10, 2024  1:29 PM Kevelyn M wrote:  Reason for CRM: Please call patient back at work [phone]. There are dead zones patient said to please leave a message if he doesn't pick up. He is calling for Clarisa and wanting to know status of med refills.

## 2024-01-11 ENCOUNTER — Other Ambulatory Visit: Payer: Self-pay | Admitting: Internal Medicine

## 2024-01-11 DIAGNOSIS — N529 Male erectile dysfunction, unspecified: Secondary | ICD-10-CM

## 2024-01-14 ENCOUNTER — Other Ambulatory Visit: Payer: Self-pay

## 2024-01-14 ENCOUNTER — Encounter (HOSPITAL_COMMUNITY): Payer: Self-pay

## 2024-01-14 ENCOUNTER — Emergency Department (HOSPITAL_COMMUNITY)

## 2024-01-14 ENCOUNTER — Emergency Department (HOSPITAL_COMMUNITY)
Admission: EM | Admit: 2024-01-14 | Discharge: 2024-01-14 | Disposition: A | Discharge status: Home / Self Care | Attending: Emergency Medicine | Admitting: Emergency Medicine

## 2024-01-14 DIAGNOSIS — E119 Type 2 diabetes mellitus without complications: Secondary | ICD-10-CM | POA: Insufficient documentation

## 2024-01-14 DIAGNOSIS — M25561 Pain in right knee: Secondary | ICD-10-CM | POA: Insufficient documentation

## 2024-01-14 DIAGNOSIS — Z7984 Long term (current) use of oral hypoglycemic drugs: Secondary | ICD-10-CM | POA: Diagnosis not present

## 2024-01-14 DIAGNOSIS — I10 Essential (primary) hypertension: Secondary | ICD-10-CM | POA: Insufficient documentation

## 2024-01-14 DIAGNOSIS — M25461 Effusion, right knee: Secondary | ICD-10-CM | POA: Diagnosis not present

## 2024-01-14 DIAGNOSIS — Z7982 Long term (current) use of aspirin: Secondary | ICD-10-CM | POA: Insufficient documentation

## 2024-01-14 DIAGNOSIS — Z79899 Other long term (current) drug therapy: Secondary | ICD-10-CM | POA: Diagnosis not present

## 2024-01-14 MED ORDER — IBUPROFEN 400 MG PO TABS
400.0000 mg | ORAL_TABLET | Freq: Once | ORAL | Status: AC
  Administered 2024-01-14: 400 mg via ORAL
  Filled 2024-01-14: qty 1

## 2024-01-14 MED ORDER — ACETAMINOPHEN 500 MG PO TABS
1000.0000 mg | ORAL_TABLET | Freq: Four times a day (QID) | ORAL | Status: DC | PRN
  Administered 2024-01-14: 1000 mg via ORAL
  Filled 2024-01-14: qty 2

## 2024-01-14 NOTE — ED Provider Triage Note (Signed)
 Emergency Medicine Provider Triage Evaluation Note  Logan Knight , a 73 y.o. male  was evaluated in triage.  Pt complains of right knee pain.  Pt complains that need popped when he stood up   Review of Systems  Positive: pain Negative:   Physical Exam  BP 134/74 (BP Location: Right Arm)   Pulse 95   Temp 97.8 F (36.6 C)   Resp 19   SpO2 98%  Gen:   Awake, no distress   Resp:  Normal effort  MSK:   Pain with movement right knee Other:    Medical Decision Making  Medically screening exam initiated at 4:34 PM.  Appropriate orders placed.  Logan Knight was informed that the remainder of the evaluation will be completed by another provider, this initial triage assessment does not replace that evaluation, and the importance of remaining in the ED until their evaluation is complete.     Logan Crosby, PA-C 01/14/24 1635

## 2024-01-14 NOTE — Progress Notes (Signed)
 Orthopedic Tech Progress Note Patient Details:  Logan Knight 1951-03-01 [id number]  Ortho Devices Type of Ortho Device: Knee Immobilizer Ortho Device/Splint Location: rle Ortho Device/Splint Interventions: Ordered, Application, Adjustment   Post Interventions Patient Tolerated: Well Instructions Provided: Care of device, Adjustment of device  [name] 01/14/2024, 8:43 PM
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
## 2024-01-14 NOTE — Discharge Instructions (Signed)
 Your x-ray today was negative.  Based on your exam, I believe that you likely injured your meniscus.  I would recommend that you follow-up with Dr. Lucienne Ryder, the orthopedic doctor that you have seen previously.  If you would prefer to see another orthopedic doctor, I have included our on-call orthopedic physician.  You can call them this week for follow-up appointment.  You can take your knee immobilizer off to sleep and to shower.

## 2024-01-14 NOTE — ED Triage Notes (Signed)
 Pt came in via POV d/t Rt knee since he felt it pop this morning when he stood out of bed. States since then it has been too painful to stand or put any pressure on it. A/Ox4, rates his pain 10/10 if trying to stand & 6/10 when sitting.

## 2024-01-15 ENCOUNTER — Other Ambulatory Visit: Payer: Self-pay | Admitting: Internal Medicine

## 2024-01-15 DIAGNOSIS — S83241A Other tear of medial meniscus, current injury, right knee, initial encounter: Secondary | ICD-10-CM | POA: Diagnosis not present

## 2024-01-15 DIAGNOSIS — N400 Enlarged prostate without lower urinary tract symptoms: Secondary | ICD-10-CM

## 2024-02-08 ENCOUNTER — Telehealth: Payer: Self-pay | Admitting: Internal Medicine

## 2024-02-08 NOTE — Telephone Encounter (Signed)
 Copied from CRM [id number]. Topic: Clinical - Medical Advice >> Feb 08, 2024  3:53 PM Sophia H wrote:  Reason for CRM: Patient wanted to advise Dr. Lincoln Renshaw of a pain he has started to have when he urinates (2 days ago). The pain is at the bottom of his stomach, wants to know if there is something OTC that he can take. Wondering if it could be a UTI. Please advise, ty! Call cell phone # [phone] or [phone]

## 2024-02-09 MED ORDER — CEFUROXIME AXETIL 250 MG PO TABS
250.0000 mg | ORAL_TABLET | Freq: Two times a day (BID) | ORAL | 0 refills | Status: AC
Start: 2024-02-09 — End: 2024-02-16

## 2024-02-09 NOTE — Addendum Note (Signed)
 Addended by: Concetta Dee B on: 02/09/2024 07:24 AM   Modules accepted: Orders

## 2024-02-09 NOTE — Telephone Encounter (Signed)
 Called & spoke to a family member. Left message with family member to have the patient call back at earliest convenience.

## 2024-02-09 NOTE — Telephone Encounter (Signed)
 This could be a urinary tract infection.  Since we are going into the weekend, I will send prescription to his pharmacy for an antibiotic called cefuroxime.  If this does not improve with antibiotic or gets worse he needs to be seen immediately. Prescription sent to [facility] on [address].

## 2024-02-12 NOTE — Telephone Encounter (Signed)
 Called but no answer. LVM to call back.

## 2024-02-12 NOTE — Telephone Encounter (Signed)
 Pt returning you call

## 2024-02-13 NOTE — Telephone Encounter (Signed)
 Third attempt. Called & but no answer. LVM informing patient that prescription was sent to the pharmacy and is ready for pick-up.

## 2024-02-22 NOTE — Telephone Encounter (Signed)
 Copied from CRM [id number]. Topic: Clinical - Medication Question >> Feb 22, 2024 10:05 AM Winona R wrote:  Pt states  medication works perfectly and would like to know what he should do following the completion of the antibiotics for the UTI please call him on cell  [phone] or [phone]

## 2024-02-22 NOTE — Telephone Encounter (Signed)
 Called but no answer. LVM to call back. No further assistance will be needed once antibiotic course is completed. Patient can call back if any symptoms return in the future.

## 2024-02-23 NOTE — Telephone Encounter (Signed)
 Patient returned call. Patient informed to call back inf any symptoms return. Patient verbalized understanding.

## 2024-02-23 NOTE — Telephone Encounter (Signed)
 Copied from CRM [id number]. Topic: Clinical - Medical Advice >> Feb 23, 2024 10:56 AM Carlatta H wrote:  Reason for CRM: Patient would like to know what he needs to make sure his  UTI doesn't come back// Please call [phone]

## 2024-02-23 NOTE — Telephone Encounter (Signed)
 Called but no answer. LVM informing patient to drink plenty of water, keep area clean and limit alcohol intake. If there is any future calls please route to triage nurse line. Thank you.

## 2024-03-17 ENCOUNTER — Other Ambulatory Visit: Payer: Self-pay | Admitting: Internal Medicine

## 2024-03-17 DIAGNOSIS — N529 Male erectile dysfunction, unspecified: Secondary | ICD-10-CM

## 2024-03-22 ENCOUNTER — Other Ambulatory Visit: Payer: Self-pay

## 2024-03-22 DIAGNOSIS — M1712 Unilateral primary osteoarthritis, left knee: Secondary | ICD-10-CM

## 2024-03-25 ENCOUNTER — Ambulatory Visit: Admitting: Orthopaedic Surgery

## 2024-04-05 ENCOUNTER — Other Ambulatory Visit: Payer: Self-pay | Admitting: Internal Medicine

## 2024-04-05 DIAGNOSIS — N529 Male erectile dysfunction, unspecified: Secondary | ICD-10-CM

## 2024-04-05 NOTE — Telephone Encounter (Signed)
 Copied from CRM [id number]. Topic: Clinical - Medication Refill >> Apr 05, 2024 12:55 PM Nathanel BROCKS wrote: Medication: sildenafil  (VIAGRA ) 100 MG tablet Can the dr up the qty  Has the patient contacted their pharmacy? Yes  This is the patient's preferred pharmacy:   [facility] #[id number], [place] - [address] AT [facility] [address] [place] [address] Phone: [phone] Fax: [phone]   Is this the correct pharmacy for this prescription? Yes If no, delete pharmacy and type the correct one.   Has the prescription been filled recently? Yes  Is the patient out of the medication? Yes  Has the patient been seen for an appointment in the last year OR does the patient have an upcoming appointment? Yes  Can we respond through MyChart? No  Agent: Please be advised that Rx refills may take up to 3 business days. We ask that you follow-up with your pharmacy.

## 2024-04-09 MED ORDER — SILDENAFIL CITRATE 100 MG PO TABS: ORAL_TABLET | ORAL | 5 refills | Status: DC

## 2024-04-09 NOTE — Telephone Encounter (Signed)
 Change to local pharmacy  Requested Prescriptions  Pending Prescriptions Disp Refills   sildenafil  (VIAGRA ) 100 MG tablet 10 tablet 5    Sig: TAKE 1/2 TO 1 TABLET AS NEEDED 1/2 HOUR PRIOR TO INTERCOURSE. LIMIT USE TO 1 TABLET PER 24 HOURS     Urology: Erectile Dysfunction Agents Passed - 04/09/2024  1:38 PM      Passed - AST in normal range and within 360 days    AST  Date Value Ref Range Status  01/04/2024 25 0 - 40 IU/L Final         Passed - ALT in normal range and within 360 days    ALT  Date Value Ref Range Status  01/04/2024 20 0 - 44 IU/L Final         Passed - Last BP in normal range    BP Readings from Last 1 Encounters:  01/14/24 136/83         Passed - Valid encounter within last 12 months    Recent Outpatient Visits           3 months ago Diabetes mellitus treated with oral medication (HCC)   [facility] [place] Vicci Sober B, MD   8 months ago Type 2 diabetes mellitus with microalbuminuria, without long-term current use of insulin  [facility])   [facility] [place]. [facility] Vicci Sober B, MD   1 year ago Type 2 diabetes mellitus with microalbuminuria, without long-term current use of insulin  [facility])   [facility] [place]. [facility] Vicci Sober B, MD   1 year ago Type 2 diabetes mellitus with microalbuminuria, without long-term current use of insulin  [facility])   [facility] [place]. [facility] Fleeta Morris, [place] L, RPH-CPP   1 year ago Type 2 diabetes mellitus with microalbuminuria, without long-term current use of insulin  [facility])    [facility] [place] Vicci Sober NOVAK, MD

## 2024-04-15 ENCOUNTER — Ambulatory Visit: Payer: Self-pay

## 2024-04-15 MED ORDER — SULFAMETHOXAZOLE-TRIMETHOPRIM 800-160 MG PO TABS
1.0000 | ORAL_TABLET | Freq: Two times a day (BID) | ORAL | 0 refills | Status: DC
Start: 2024-04-15 — End: 2024-06-20

## 2024-04-15 NOTE — Telephone Encounter (Signed)
 Please fax our call in prescription for Bactrim  at [facility] on a specimen.  I tried sending it electronically today but it would not go through.  Please let patient know that we are sending this antibiotic for him to treat his urinary symptoms.

## 2024-04-15 NOTE — Telephone Encounter (Signed)
 FYI Only or Action Required?: Action required by provider: clinical question for provider.  Patient was last seen in primary care on 01/04/2024 by Vicci Barnie NOVAK, MD.  Called Nurse Triage reporting Urinary Tract Infection.  Symptoms began several days ago.  Interventions attempted: Nothing.  Symptoms are: unchanged.  Triage Disposition: See Physician Within 24 Hours  Patient/caregiver understands and will follow disposition?: UnsureCopied from CRM #[id number]. Topic: Clinical - Red Word Triage >> Apr 15, 2024  8:39 AM Willma SAUNDERS wrote: [facility] that prompted transfer to Nurse Triage: Patient states he thinks he has a UTI. Has pain at the bottom of his stomach during urination. Has been ongoing for a few days. Reason for Disposition  Urinating more frequently than usual (i.e., frequency) OR new-onset of the feeling of an urgent need to urinate (i.e., urgency)  Answer Assessment - Initial Assessment Questions Pt is requesting antibiotics  to be called in. Dr Vicci did this for me before. It's  hard to get off work. Please call me back at [phone]. Please advise.     1. SYMPTOM: What's the main symptom you're concerned about? (e.g., frequency, incontinence)     Pain at bottom of stomach 2. ONSET: When did the    start?     Saturday  3. PAIN: Is there any pain? If Yes, ask: How bad is it? (Scale: 1-10; mild, moderate, severe)     9 4. CAUSE: What do you think is causing the symptoms?     Possible UTI 5. OTHER SYMPTOMS: Do you have any other symptoms? (e.g., blood in urine, fever, flank pain, pain with urination)     urgency  Protocols used: Urinary Symptoms-A-AH

## 2024-04-16 ENCOUNTER — Telehealth: Payer: Self-pay | Admitting: Internal Medicine

## 2024-04-16 NOTE — Telephone Encounter (Signed)
 Copied from CRM #[id number]. Topic: General - Other >> Apr 15, 2024  3:13 PM Dedra B wrote: Reason for CRM: Pt called in saying that he was still waiting on a callback from his provider. He said he initially called in this morning but still hasn't heard back. Pls give pt a call at home or his mobile number.

## 2024-04-16 NOTE — Telephone Encounter (Signed)
 Called but no answer. LVM informing that a prescription has been sent to his pharmacy.

## 2024-04-16 NOTE — Telephone Encounter (Signed)
 Called but no answer. LVM informing that prescription was sent to the pharmacy today 04/16/2024.

## 2024-04-17 ENCOUNTER — Other Ambulatory Visit: Payer: Self-pay | Admitting: Internal Medicine

## 2024-04-17 DIAGNOSIS — E1169 Type 2 diabetes mellitus with other specified complication: Secondary | ICD-10-CM

## 2024-04-23 ENCOUNTER — Telehealth: Payer: Self-pay

## 2024-04-23 NOTE — Telephone Encounter (Signed)
 Make sure that he completed the antibiotics that was sent 04/15/2024.  Give follow-up appointment if still having symptoms.  Can be scheduled with any provider.

## 2024-04-23 NOTE — Telephone Encounter (Signed)
 Copied from CRM [id number]. Topic: Clinical - Medical Advice >> Apr 23, 2024  3:50 PM Berwyn MATSU wrote: Reason for CRM:  patient called in requesting to know if this treatment of antibiotics is enough or will he need more antibiotics or will he be rechecked?   Patient is requesting a call back.    May you please advise.

## 2024-04-24 NOTE — Telephone Encounter (Signed)
 I left a message for the patient to return my call.

## 2024-04-25 ENCOUNTER — Telehealth: Payer: Self-pay | Admitting: Internal Medicine

## 2024-04-25 NOTE — Telephone Encounter (Signed)
 Copied from CRM #[id number]. Topic: General - Other >> Apr 25, 2024  8:36 AM Marissa P wrote: Reason for CRM: Patient returned Delores Calton FALCON, RN call wanted to know if there is anything he can b edoing on his end so this does not happen again or something over the counter to take, he is feeling better, pain subsided no symptoms anymore he stated.

## 2024-05-03 ENCOUNTER — Telehealth: Payer: Self-pay | Admitting: Internal Medicine

## 2024-05-03 NOTE — Telephone Encounter (Signed)
 Called patient to confirm upcoming appointment 05/06/2024, no answer. Unable to leave VM.

## 2024-05-06 ENCOUNTER — Ambulatory Visit: Attending: Internal Medicine | Admitting: Internal Medicine

## 2024-05-06 ENCOUNTER — Ambulatory Visit: Admitting: Internal Medicine

## 2024-05-06 ENCOUNTER — Telehealth: Payer: Self-pay | Admitting: Internal Medicine

## 2024-05-06 ENCOUNTER — Encounter: Payer: Self-pay | Admitting: Internal Medicine

## 2024-05-06 VITALS — BP 115/69 | HR 84 | Temp 98.2°F | Ht 73.0 in | Wt 207.0 lb

## 2024-05-06 DIAGNOSIS — E1129 Type 2 diabetes mellitus with other diabetic kidney complication: Secondary | ICD-10-CM | POA: Diagnosis not present

## 2024-05-06 DIAGNOSIS — M170 Bilateral primary osteoarthritis of knee: Secondary | ICD-10-CM | POA: Diagnosis not present

## 2024-05-06 DIAGNOSIS — N529 Male erectile dysfunction, unspecified: Secondary | ICD-10-CM | POA: Diagnosis not present

## 2024-05-06 DIAGNOSIS — R809 Proteinuria, unspecified: Secondary | ICD-10-CM

## 2024-05-06 DIAGNOSIS — Z23 Encounter for immunization: Secondary | ICD-10-CM

## 2024-05-06 DIAGNOSIS — Z7984 Long term (current) use of oral hypoglycemic drugs: Secondary | ICD-10-CM

## 2024-05-06 DIAGNOSIS — E1169 Type 2 diabetes mellitus with other specified complication: Secondary | ICD-10-CM

## 2024-05-06 DIAGNOSIS — E785 Hyperlipidemia, unspecified: Secondary | ICD-10-CM

## 2024-05-06 DIAGNOSIS — I152 Hypertension secondary to endocrine disorders: Secondary | ICD-10-CM

## 2024-05-06 DIAGNOSIS — E1159 Type 2 diabetes mellitus with other circulatory complications: Secondary | ICD-10-CM | POA: Diagnosis not present

## 2024-05-06 DIAGNOSIS — Z79899 Other long term (current) drug therapy: Secondary | ICD-10-CM

## 2024-05-06 LAB — POCT GLYCOSYLATED HEMOGLOBIN (HGB A1C): Hemoglobin A1C: 6.2 % — AB (ref 4.0–5.6)

## 2024-05-06 MED ORDER — TRAMADOL HCL 50 MG PO TABS: 50.0000 mg | ORAL_TABLET | Freq: Two times a day (BID) | ORAL | 0 refills | Status: AC | PRN

## 2024-05-06 MED ORDER — SILDENAFIL CITRATE 100 MG PO TABS: ORAL_TABLET | ORAL | 5 refills | Status: AC
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
## 2024-05-06 NOTE — Telephone Encounter (Signed)
 Contacted pt left vm to resch appt Dr.Johnson not in the office

## 2024-05-06 NOTE — Patient Instructions (Addendum)
 Continue healthy eating and activity. Try to cut back on drinking sodas.   We will start you on Tramadol  for pain in your knees so you can function during the day at work. This drug can cause drowsiness and constipation. Please keep this mediations safe and do not share this medication with other people. We cannot refill this medication early. Your first prescription will have 10 tablets to see if you are tolerating it well let us  know and we can send more.  I will increase your Viagra  tablets to 30 pills instead of 10.

## 2024-05-08 ENCOUNTER — Encounter: Payer: Self-pay | Admitting: Internal Medicine

## 2024-05-13 ENCOUNTER — Telehealth: Payer: Self-pay

## 2024-05-13 MED ORDER — TRAMADOL HCL 50 MG PO TABS: 50.0000 mg | ORAL_TABLET | Freq: Two times a day (BID) | ORAL | 1 refills | Status: DC | PRN

## 2024-05-13 NOTE — Telephone Encounter (Signed)
 Rx for Tramadol  sent to [facility].

## 2024-05-13 NOTE — Addendum Note (Signed)
 Addended by: VICCI SOBER B on: 05/13/2024 09:27 PM   Modules accepted: Orders

## 2024-05-13 NOTE — Telephone Encounter (Signed)
 Copied from CRM [id number]. Topic: Clinical - Medical Advice >> May 13, 2024 10:02 AM Martinique E wrote: Reason for CRM: Patient wanted to relay to PCP that the tramadol  has tremendously helped with the pain in his knees, and would like refills of this medication sent over to the pharmacy, as he was just given a 10 pill supply. Callback number for patient is [phone].

## 2024-05-14 NOTE — Telephone Encounter (Signed)
 Called but no answer. LVM informing patient that the requested refill has been sent to the pharmacy.

## 2024-05-16 ENCOUNTER — Other Ambulatory Visit: Payer: Self-pay | Admitting: Internal Medicine

## 2024-05-16 DIAGNOSIS — E1129 Type 2 diabetes mellitus with other diabetic kidney complication: Secondary | ICD-10-CM

## 2024-05-17 ENCOUNTER — Other Ambulatory Visit: Payer: Self-pay | Admitting: Internal Medicine

## 2024-05-17 DIAGNOSIS — E1129 Type 2 diabetes mellitus with other diabetic kidney complication: Secondary | ICD-10-CM

## 2024-05-21 ENCOUNTER — Ambulatory Visit: Payer: Medicare HMO | Attending: Internal Medicine

## 2024-05-24 NOTE — Telephone Encounter (Unsigned)
 Copied from CRM [id number]. Topic: General - Other >> May 24, 2024  9:00 AM Myrick T wrote: Reason for CRM: patient called requesting proof of the flu shot he got at his visit on 9/8. Please f/u with patient at [phone] when its available. Grayce Gibbon will come by to pick it up.

## 2024-05-24 NOTE — Telephone Encounter (Signed)
 Called patient to advise immunization report is at the front office. May pick up before 1730, Mon- Fri.

## 2024-06-09 ENCOUNTER — Other Ambulatory Visit: Payer: Self-pay | Admitting: Internal Medicine

## 2024-06-09 DIAGNOSIS — I152 Hypertension secondary to endocrine disorders: Secondary | ICD-10-CM

## 2024-06-18 ENCOUNTER — Ambulatory Visit: Payer: Self-pay

## 2024-06-18 NOTE — Telephone Encounter (Signed)
 Patient c/o lower abdominal pain with urination. Patient states this happened back in August and he was given an antibiotic and symptoms went away. Patient denies any urinary frequency or urgency. Patient is asking for medication to be sent in for his symptoms. Explained to patient that this is up to the provider's discretion and that patient may have to be evaluated. Patient verbalized understanding but states he would like to see if medication can be sent in. Patient would like a call back.  FYI Only or Action Required?: Action required by provider: clinical question for provider.  Patient was last seen in primary care on 05/06/2024 by Vicci Barnie NOVAK, MD.  Called Nurse Triage reporting Abdominal Pain.  Symptoms began 2 days ago.  Interventions attempted: Rest, hydration, or home remedies.  Symptoms are: unchanged.  Triage Disposition: See Physician Within 24 Hours  Patient/caregiver understands and will follow disposition?:   Copied from CRM [id number]. Topic: Clinical - Red Word Triage >> Jun 18, 2024  2:17 PM Antwanette L wrote: Red Word that prompted transfer to Nurse Triage: The patient reports experiencing abdominal pain for the past two days and is requesting medication for a uti Reason for Disposition  [1] MODERATE pain (e.g., interferes with normal activities) AND [2] pain comes and goes (cramps) AND [3] present > 24 hours  (Exception: Pain with Vomiting or Diarrhea - see that Guideline.)  Answer Assessment - Initial Assessment Questions 1. LOCATION: Where does it hurt?      Lower abdominal pain with urination 2. RADIATION: Does the pain shoot anywhere else? (e.g., chest, back)     No radiation 3. ONSET: When did the pain begin? (Minutes, hours or days ago)      Two days ago 4. SUDDEN: Gradual or sudden onset?     gradual 5. PATTERN Does the pain come and go, or is it constant?     Comes and goes 6. SEVERITY: How bad is the pain?  (e.g., Scale 1-10; mild,  moderate, or severe)     8 out of 10 when it comes on. No pain currently.  7. RECURRENT SYMPTOM: Have you ever had this type of stomach pain before? If Yes, ask: When was the last time? and What happened that time?      Yes-was treated for a UTI in August 8. CAUSE: What do you think is causing the stomach pain? (e.g., gallstones, recent abdominal surgery)     Patient is thinking that this is a possible UTI 9. RELIEVING/AGGRAVATING FACTORS: What makes it better or worse? (e.g., antacids, bending or twisting motion, bowel movement)     no 10. OTHER SYMPTOMS: Do you have any other symptoms? (e.g., back pain, diarrhea, fever, urination pain, vomiting)       Pain with urination  Protocols used: Abdominal Pain - Male-A-AH

## 2024-06-19 NOTE — Telephone Encounter (Signed)
 Called but no answer. Unable to LVM due to VM being full. Please direct to mobile clinic if patient calls back. No available appointments at this time.

## 2024-06-20 ENCOUNTER — Ambulatory Visit: Payer: Self-pay

## 2024-06-20 ENCOUNTER — Ambulatory Visit (HOSPITAL_COMMUNITY)
Admission: EM | Admit: 2024-06-20 | Discharge: 2024-06-20 | Disposition: A | Discharge status: Home / Self Care | Attending: Family Medicine | Admitting: Family Medicine

## 2024-06-20 ENCOUNTER — Encounter (HOSPITAL_COMMUNITY): Payer: Self-pay | Admitting: Emergency Medicine

## 2024-06-20 ENCOUNTER — Ambulatory Visit (HOSPITAL_COMMUNITY): Payer: Self-pay

## 2024-06-20 DIAGNOSIS — N390 Urinary tract infection, site not specified: Secondary | ICD-10-CM | POA: Diagnosis not present

## 2024-06-20 DIAGNOSIS — R319 Hematuria, unspecified: Secondary | ICD-10-CM | POA: Insufficient documentation

## 2024-06-20 LAB — POCT URINALYSIS DIP (MANUAL ENTRY)
Bilirubin, UA: NEGATIVE
Glucose, UA: NEGATIVE mg/dL
Ketones, POC UA: NEGATIVE mg/dL
Nitrite, UA: NEGATIVE
Protein Ur, POC: 30 mg/dL — AB
Spec Grav, UA: 1.02 (ref 1.010–1.025)
Urobilinogen, UA: 1 U/dL
pH, UA: 7 (ref 5.0–8.0)

## 2024-06-20 MED ORDER — PHENAZOPYRIDINE HCL 200 MG PO TABS: 200.0000 mg | ORAL_TABLET | Freq: Three times a day (TID) | ORAL | 0 refills | Status: DC

## 2024-06-20 MED ORDER — SULFAMETHOXAZOLE-TRIMETHOPRIM 800-160 MG PO TABS: 1.0000 | ORAL_TABLET | Freq: Two times a day (BID) | ORAL | 0 refills | Status: AC

## 2024-06-20 NOTE — Discharge Instructions (Signed)
 Based on your symptoms and results of the urinalysis I believe you have a UTI I recommend the following:  I have sent in a script for Bactrim  to be taken by mouth twice per day for 7 days.  Please finish the entire course of the antibiotic even if you are feeling better before it is completed unless you develop an allergic reaction or are told by a medical provider to stop taking it. We have sent a sample of your urine off for a urine culture.  This will help us  determine what bacteria is causing your symptoms as well as the most appropriate antibiotic to treat it.  If we need to make any adjustments to your medication regimen and new medication will be sent to the pharmacy on file and you will be updated via phone call in MyChart. Stay well hydrated (at least 75 oz of water per day) and avoid holding your urine for prolonged periods of time. If you have any of the following please return to urgent care or go to the emergency room: Persistent symptoms, fever, trouble urinating or inability to urinate, confusion, flank pain.

## 2024-06-20 NOTE — ED Notes (Signed)
 Pt unable to void at this time for a urine specimen. Pt given cup water.
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
## 2024-06-20 NOTE — ED Triage Notes (Signed)
 Pt reports for 3 days when urinates had pain in bladder area. Reports called his PCP and couldn't be in to see her and advised to go to UC.

## 2024-06-20 NOTE — Telephone Encounter (Signed)
 FYI Only or Action Required?: FYI only for provider.  Patient was last seen in primary care on 05/06/2024 by Vicci Barnie NOVAK, MD.  Called Nurse Triage reporting No chief complaint on file..  Symptoms began No triage.  Interventions attempted: Other: No triage.  Symptoms are: unchanged.  Triage Disposition: Call PCP Now  Patient/caregiver understands and will follow disposition?: Yes  Copied from CRM [id number]. Topic: Clinical - Red Word Triage >> Jun 20, 2024 10:00 AM Antwanette L wrote: Red Word that prompted transfer to Nurse Triage: Patient reports abdominal pain and suspects a possible urinary tract infection (UTI). Patient requests that Dr. Vicci call in medication to [facility] located at [address] [place], [place] [address].  Reason for Disposition  Requesting regular office appointment    Requesting rx be called in or to schedule an appt  [1] Follow-up call from patient regarding patient's clinical status AND [2] information urgent  Answer Assessment - Initial Assessment Questions No office appt availability in pt region until 10/30. Called [facility] and spoke with Daniella who confirmed no appt availability. Advised mobile bus, pt unable to go as he gets off works at 4:30 pm and mobile bus closes at [employer] today. Advised UC, pt agreeable to go. UC appt scheduled for 7pm today and advised to call back for worsening symptoms.   1. REASON FOR CALL or QUESTION: What is your reason for calling today? or How can I best      Having UTI-like symptoms, spoke with another triage nurse regarding current symptoms yesterday. No change in symptoms. Intermittent 8/10 lower abdominal pain, small amount of blood in urine, pain with urination. Wanting to know if an abx can be called in for UTI-like symptoms. Per note from PCP office, would need to be seen by a provider first.  2. CALLER: Document the source of call. (e.g., laboratory staff, caregiver or patient).     Patient  Protocols used:  PCP Call - No Triage-A-AH, Information Only Call - No Triage-A-AH

## 2024-06-20 NOTE — Telephone Encounter (Signed)
 Noted

## 2024-06-21 NOTE — Telephone Encounter (Signed)
 Patient seen at [facility] for UTI concerns. No further assistance needed at this time.

## 2024-06-22 LAB — URINE CULTURE: Culture: >=100000 — AB

## 2024-06-24 ENCOUNTER — Ambulatory Visit (HOSPITAL_COMMUNITY): Payer: Self-pay

## 2024-06-26 ENCOUNTER — Other Ambulatory Visit: Payer: Self-pay | Admitting: Internal Medicine

## 2024-06-26 NOTE — Telephone Encounter (Unsigned)
 Copied from CRM #[id number]. Topic: Clinical - Medication Refill >> Jun 26, 2024  3:48 PM Charlet HERO wrote: Medication: traMADol  (ULTRAM ) 50 MG tablet viagra  100 mg  Has the patient contacted their pharmacy? Yes (0 refill  This is the patient's preferred pharmacy:    [facility] STORE #[id number] [facility], [place] - [address] AT [facility] [address] [place] [address] Phone: [phone] Fax: [phone]    Is this the correct pharmacy for this prescription? Yes If no, delete pharmacy and type the correct one.   Has the prescription been filled recently? Yes  Is the patient out of the medication? Yes  Has the patient been seen for an appointment in the last year OR does the patient have an upcoming appointment? Yes  Can we respond through MyChart? No  Agent: Please be advised that Rx refills may take up to 3 business days. We ask that you follow-up with your pharmacy.

## 2024-06-28 NOTE — Telephone Encounter (Signed)
 Requested medication (s) are due for refill today: yes  Requested medication (s) are on the active medication list: yes  Last refill:  05/13/24  Future visit scheduled: {Yes  Notes to clinic:  Unable to refill per protocol, cannot delegate.      Requested Prescriptions  Pending Prescriptions Disp Refills   traMADol  (ULTRAM ) 50 MG tablet 60 tablet 1    Sig: Take 1 tablet (50 mg total) by mouth every 12 (twelve) hours as needed.     Not Delegated - Analgesics:  Opioid Agonists Failed - 06/28/2024 12:07 PM      Failed - This refill cannot be delegated      Failed - Urine Drug Screen completed in last 360 days      Passed - Valid encounter within last 3 months    Recent Outpatient Visits           1 month ago Need for immunization against influenza   [facility] [place] Vicci Sober B, MD   5 months ago Diabetes mellitus treated with oral medication [facility])   [facility] [place]. [facility] Vicci Sober B, MD   11 months ago Type 2 diabetes mellitus with microalbuminuria, without long-term current use of insulin  [facility])   [facility] [place]. [facility] Vicci Sober B, MD   1 year ago Type 2 diabetes mellitus with microalbuminuria, without long-term current use of insulin  [facility])   [facility] [place]. [facility] Vicci Sober B, MD   1 year ago Type 2 diabetes mellitus with microalbuminuria, without long-term current use of insulin  [facility])   [facility] [place]. [facility] Tonia Garnette LITTIE, RPH-CPP       Future Appointments             In 1 week Vernetta Lonni GRADE, MD [facility]

## 2024-07-10 ENCOUNTER — Ambulatory Visit: Admitting: Orthopaedic Surgery

## 2024-07-29 ENCOUNTER — Other Ambulatory Visit: Payer: Self-pay | Admitting: Internal Medicine

## 2024-07-29 DIAGNOSIS — E1159 Type 2 diabetes mellitus with other circulatory complications: Secondary | ICD-10-CM

## 2024-08-02 ENCOUNTER — Other Ambulatory Visit: Payer: Self-pay | Admitting: Internal Medicine

## 2024-08-02 DIAGNOSIS — N529 Male erectile dysfunction, unspecified: Secondary | ICD-10-CM

## 2024-08-02 NOTE — Telephone Encounter (Signed)
 Copied from CRM [id number]. Topic: Clinical - Medication Refill >> Aug 02, 2024  8:32 AM Victoria B wrote: Medication: sildenafil  (VIAGRA ) 100 MG table  Has the patient contacted their pharmacy? No    This is the patient's preferred pharmacy:  [id number], [place] - [address] AT [facility] [address] [place] [address] Phone: [phone] Fax: [phone]    Is this the correct pharmacy for this prescription? yes   Has the prescription been filled recently? no  Is the patient out of the medication? yes  Has the patient been seen for an appointment in the last year OR does the patient have an upcoming appointment? yes  Can we respond through MyChart? no  Agent: Please be advised that Rx refills may take up to 3 business days. We ask that you follow-up with your pharmacy.

## 2024-08-05 NOTE — Telephone Encounter (Signed)
 Rx 05/06/24 #30 5RF- too soon Requested Prescriptions  Pending Prescriptions Disp Refills   sildenafil  (VIAGRA ) 100 MG tablet 30 tablet 5    Sig: TAKE 1/2 TO 1 TABLET AS NEEDED 1/2 HOUR PRIOR TO INTERCOURSE. LIMIT USE TO 1 TABLET PER 24 HOURS     Urology: Erectile Dysfunction Agents Failed - 08/05/2024  2:40 PM      Failed - Last BP in normal range    BP Readings from Last 1 Encounters:  06/20/24 (!) 152/84         Passed - AST in normal range and within 360 days    AST  Date Value Ref Range Status  01/04/2024 25 0 - 40 IU/L Final         Passed - ALT in normal range and within 360 days    ALT  Date Value Ref Range Status  01/04/2024 20 0 - 44 IU/L Final         Passed - Valid encounter within last 12 months    Recent Outpatient Visits           3 months ago Need for immunization against influenza   [facility] [place]. [facility] Vicci Sober B, MD   7 months ago Diabetes mellitus treated with oral medication [facility])   [place] [facility] [place]. [facility] Vicci Sober B, MD   1 year ago Type 2 diabetes mellitus with microalbuminuria, without long-term current use of insulin  [facility])   [place] [facility] [place]. [facility] Vicci Sober B, MD   1 year ago Type 2 diabetes mellitus with microalbuminuria, without long-term current use of insulin  [facility])   [place] [facility] [place]. [facility] Vicci Sober B, MD   1 year ago Type 2 diabetes mellitus with microalbuminuria, without long-term current use of insulin  [facility])   [place] [facility] [place]. [facility] Fleeta Tonia Garnette LITTIE, RPH-CPP

## 2024-09-05 ENCOUNTER — Encounter: Payer: Self-pay | Admitting: Internal Medicine

## 2024-09-05 ENCOUNTER — Ambulatory Visit: Attending: Internal Medicine | Admitting: Internal Medicine

## 2024-09-05 VITALS — BP 101/66 | HR 78 | Temp 98.1°F | Ht 73.0 in | Wt 205.0 lb

## 2024-09-05 DIAGNOSIS — I10 Essential (primary) hypertension: Secondary | ICD-10-CM | POA: Diagnosis not present

## 2024-09-05 DIAGNOSIS — K5903 Drug induced constipation: Secondary | ICD-10-CM | POA: Diagnosis not present

## 2024-09-05 DIAGNOSIS — E1169 Type 2 diabetes mellitus with other specified complication: Secondary | ICD-10-CM

## 2024-09-05 DIAGNOSIS — E1129 Type 2 diabetes mellitus with other diabetic kidney complication: Secondary | ICD-10-CM | POA: Diagnosis not present

## 2024-09-05 DIAGNOSIS — E1159 Type 2 diabetes mellitus with other circulatory complications: Secondary | ICD-10-CM | POA: Diagnosis not present

## 2024-09-05 DIAGNOSIS — Z7984 Long term (current) use of oral hypoglycemic drugs: Secondary | ICD-10-CM

## 2024-09-05 DIAGNOSIS — I152 Hypertension secondary to endocrine disorders: Secondary | ICD-10-CM

## 2024-09-05 DIAGNOSIS — N400 Enlarged prostate without lower urinary tract symptoms: Secondary | ICD-10-CM

## 2024-09-05 DIAGNOSIS — M170 Bilateral primary osteoarthritis of knee: Secondary | ICD-10-CM

## 2024-09-05 DIAGNOSIS — R809 Proteinuria, unspecified: Secondary | ICD-10-CM | POA: Diagnosis not present

## 2024-09-05 DIAGNOSIS — Z79891 Long term (current) use of opiate analgesic: Secondary | ICD-10-CM | POA: Diagnosis not present

## 2024-09-05 DIAGNOSIS — E785 Hyperlipidemia, unspecified: Secondary | ICD-10-CM

## 2024-09-05 DIAGNOSIS — E119 Type 2 diabetes mellitus without complications: Secondary | ICD-10-CM

## 2024-09-05 DIAGNOSIS — Z5181 Encounter for therapeutic drug level monitoring: Secondary | ICD-10-CM

## 2024-09-05 DIAGNOSIS — T402X5A Adverse effect of other opioids, initial encounter: Secondary | ICD-10-CM

## 2024-09-05 LAB — GLUCOSE, POCT (MANUAL RESULT ENTRY): POC Glucose: 175 mg/dL — AB (ref 70–99)

## 2024-09-05 LAB — POCT GLYCOSYLATED HEMOGLOBIN (HGB A1C): HbA1c, POC (controlled diabetic range): 6.8 % (ref 0.0–7.0)

## 2024-09-05 MED ORDER — TAMSULOSIN HCL 0.4 MG PO CAPS: 0.4000 mg | ORAL_CAPSULE | Freq: Every day | ORAL | 2 refills | Status: AC

## 2024-09-05 MED ORDER — METFORMIN HCL 1000 MG PO TABS: 1000.0000 mg | ORAL_TABLET | Freq: Two times a day (BID) | ORAL | 1 refills | Status: AC

## 2024-09-05 MED ORDER — CARVEDILOL 25 MG PO TABS: 25.0000 mg | ORAL_TABLET | Freq: Two times a day (BID) | ORAL | 1 refills | Status: AC

## 2024-09-05 MED ORDER — HYDROCHLOROTHIAZIDE 25 MG PO TABS: 25.0000 mg | ORAL_TABLET | Freq: Every day | ORAL | 1 refills | Status: AC

## 2024-09-05 MED ORDER — POLYETHYLENE GLYCOL 3350 17 GM/SCOOP PO POWD: 17.0000 g | Freq: Every day | ORAL | 1 refills | Status: AC | PRN

## 2024-09-05 NOTE — Patient Instructions (Signed)
" °  VISIT SUMMARY: Logan Knight, during your visit today, we reviewed your diabetes, hypertension, and osteoarthritis management. We discussed your recent blood sugar levels, your current medications, and strategies to improve your overall health. We also addressed your osteoarthritis pain and the constipation related to your medication.  YOUR PLAN: -TYPE 2 DIABETES MELLITUS: Your A1c level has slightly increased to 6.8%, and your fasting blood sugars are above the target range. Type 2 diabetes is a condition where your body does not use insulin  properly, leading to high blood sugar levels. We will continue your current dose of metformin  (1000 mg twice daily) and encourage you to monitor your blood sugar daily, especially your fasting levels. Please try to increase your intake of low-sugar fruits and vegetables and use low-fat salad dressings. We discussed the potential addition of a new medication, Jardiance, if your urinary tract infections are managed.  -PRIMARY OSTEOARTHRITIS OF BOTH KNEES: Osteoarthritis is a condition that causes the cartilage in your joints to break down, leading to pain and stiffness. Your left knee is more symptomatic. We will continue with tramadol  as needed for pain management and encourage you to use a walker for long distances to reduce knee pain. We also discussed the possibility of future surgery when it becomes feasible for you.  -OPIOID-INDUCED CONSTIPATION: Constipation is a common side effect of opioid medications like tramadol . To manage this, we recommend taking Miralax  daily as needed, increasing your dietary fiber intake through smoothies and leafy greens, and continuing to use stool softeners as needed.  INSTRUCTIONS: Please continue with your current medications and follow the dietary and lifestyle recommendations discussed. Monitor your blood sugar levels daily, especially fasting levels. If you experience any issues or have questions, please schedule a follow-up  appointment. We will consider adding Jardiance if your urinary tract infections are managed. For your osteoarthritis, continue using tramadol  and a walker for long distances. Use Miralax  and stool softeners as needed for constipation. Follow up in four months for your next routine check-up.                      Contains text generated by [employer]bridge.                                 Contains text generated by Abridge.   "
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
## 2024-09-11 LAB — DRUG SCREEN 764883 11+OXYCO+ALC+CRT-BUND
Amphetamines, Urine: NEGATIVE ng/mL
BENZODIAZ UR QL: NEGATIVE ng/mL
Barbiturate screen, urine: NEGATIVE ng/mL
Cannabinoid Quant, Ur: NEGATIVE ng/mL
Cocaine (Metab.): NEGATIVE ng/mL
Creatinine, Urine: 149.5 mg/dL (ref 20.0–300.0)
Ethanol, Urine: NEGATIVE %
Meperidine: NEGATIVE ng/mL
Methadone Screen, Urine: NEGATIVE ng/mL
Nitrite Urine, Quantitative: NEGATIVE ug/mL
OPIATE SCREEN URINE: NEGATIVE ng/mL
Oxycodone/Oxymorphone, Urine: NEGATIVE ng/mL
PCP Quant, Ur: NEGATIVE ng/mL
Propoxyphene: NEGATIVE ng/mL
pH, Urine: 5.2 (ref 4.5–8.9)

## 2024-09-11 LAB — TRAMADOL GC/MS, URINE
Tramadol gc/ms Conf: >10000 ng/mL
Tramadol: POSITIVE — AB

## 2024-09-30 ENCOUNTER — Other Ambulatory Visit: Payer: Self-pay | Admitting: Internal Medicine

## 2025-01-06 ENCOUNTER — Ambulatory Visit: Payer: Self-pay | Admitting: Internal Medicine
